# Patient Record
Sex: Female | Born: 1990 | Race: Black or African American | Hispanic: No | Marital: Single | Smoking: Never smoker
Health system: Southern US, Community
[De-identification: ages and names within clinical notes are randomized; demographics above are authoritative.]

## PROBLEM LIST (undated history)

## (undated) DIAGNOSIS — D649 Anemia, unspecified: Secondary | ICD-10-CM

## (undated) DIAGNOSIS — I10 Essential (primary) hypertension: Secondary | ICD-10-CM

## (undated) DIAGNOSIS — J45909 Unspecified asthma, uncomplicated: Secondary | ICD-10-CM

## (undated) DIAGNOSIS — Z9109 Other allergy status, other than to drugs and biological substances: Secondary | ICD-10-CM

## (undated) HISTORY — PX: NO PAST SURGERIES: SHX2092

## (undated) HISTORY — PX: WISDOM TOOTH EXTRACTION: SHX21

---
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
## 2011-01-10 LAB — RAPID STREP SCREEN (MED CTR MEBANE ONLY): Streptococcus, Group A Screen (Direct): NEGATIVE

## 2011-01-10 LAB — STREP A DNA PROBE: Group A Strep Probe: NEGATIVE

## 2011-03-08 ENCOUNTER — Emergency Department (HOSPITAL_COMMUNITY)
Admission: EM | Admit: 2011-03-08 | Discharge: 2011-03-08 | Disposition: A | Discharge status: Home / Self Care | Payer: Medicaid - Out of State | Attending: Emergency Medicine | Admitting: Emergency Medicine

## 2011-03-08 DIAGNOSIS — J029 Acute pharyngitis, unspecified: Secondary | ICD-10-CM

## 2011-03-08 MED ORDER — PENICILLIN G BENZATHINE 1200000 UNIT/2ML IM SUSP
1.2000 10*6.[IU] | Freq: Once | INTRAMUSCULAR | Status: AC
  Administered 2011-03-08: 1.2 10*6.[IU] via INTRAMUSCULAR
  Filled 2011-03-08: qty 2

## 2011-03-08 MED ORDER — HYDROCODONE-ACETAMINOPHEN 7.5-500 MG/15ML PO SOLN: 10.0000 mL | Freq: Four times a day (QID) | ORAL | Status: AC | PRN

## 2011-03-08 MED ORDER — HYDROCODONE-ACETAMINOPHEN 7.5-500 MG/15ML PO SOLN
10.0000 mL | Freq: Once | ORAL | Status: AC
  Administered 2011-03-08: 10 mL via ORAL
  Filled 2011-03-08: qty 15

## 2011-03-08 NOTE — ED Notes (Signed)
Sore throat and nasal congestion  3-4 days

## 2011-03-08 NOTE — ED Provider Notes (Signed)
History     CSN: [id number] Arrival date & time: 03/08/2011  2:10 AM   First MD Initiated Contact with Patient 03/08/11 0236      Chief Complaint  Patient presents with  . Sore Throat    (Consider location/radiation/quality/duration/timing/severity/associated sxs/prior treatment) Patient is a 20 y.o. female presenting with pharyngitis. The history is provided by the patient and a parent.  Sore Throat Pertinent negatives include no headaches.   the patient is a 20 year old portly, obese female, with no significant past medical history, who presents to the emergency department complaining of congestion and sore throat for 3 days.  She has had a fever, as well.  She denies a cough.  She has had nausea and vomiting.  No diarrhea.  Besides her throat.  She does not hurt anywhere else.  She does not smoke cigarettes.  The  History reviewed. No pertinent past medical history.  History reviewed. No pertinent past surgical history.  No family history on file.  History  Substance Use Topics  . Smoking status: Never Smoker   . Smokeless tobacco: Not on file  . Alcohol Use: No    OB History    Grav Para Term Preterm Abortions TAB SAB Ect Mult Living                  Review of Systems  Constitutional: Positive for fever and chills.  HENT: Positive for congestion, sore throat and trouble swallowing. Negative for voice change.   Respiratory: Negative for cough.   Gastrointestinal: Positive for nausea and vomiting.  Skin: Negative for rash.  Neurological: Negative for headaches.  Psychiatric/Behavioral: Negative for confusion.    Allergies  Review of patient's allergies indicates no known allergies.  Home Medications  No current outpatient prescriptions on file.  BP 131/87  Pulse 102  Temp(Src) 97.4 F (36.3 C) (Oral)  Resp 22  Ht 5\' 3"  (1.6 m)  Wt 318 lb 5 oz (144.386 kg)  BMI 56.39 kg/m2  SpO2 100%  LMP 02/27/2011  Physical Exam  Constitutional: She is oriented to  person, place, and time.       Morbidly obese  HENT:  Head: Normocephalic and atraumatic.  Mouth/Throat: Oropharyngeal exudate present.       Bilateral tonsillar edema with erythema and exudate  Eyes: Pupils are equal, round, and reactive to light.  Neck: Normal range of motion.  Pulmonary/Chest: No respiratory distress.  Musculoskeletal: Normal range of motion.  Lymphadenopathy:    She has cervical adenopathy.  Neurological: She is alert and oriented to person, place, and time. No cranial nerve deficit.  Skin: Skin is warm and dry.  Psychiatric: She has a normal mood and affect. Her behavior is normal.    ED Course  Procedures (including critical care time)       MDM  Exudative pharyngitis No evidence of peritonsillar abscess The patient is not toxic.  She speaks clearly and there is no evidence of airway compromise        Nicholes Stairs, MD 03/08/11 [phone]

## 2011-03-21 ENCOUNTER — Emergency Department (HOSPITAL_COMMUNITY)
Admission: EM | Admit: 2011-03-21 | Discharge: 2011-03-22 | Disposition: A | Discharge status: Home / Self Care | Payer: Medicaid - Out of State | Attending: Emergency Medicine | Admitting: Emergency Medicine

## 2011-03-21 ENCOUNTER — Encounter (HOSPITAL_COMMUNITY): Payer: Self-pay | Admitting: *Deleted

## 2011-03-21 DIAGNOSIS — J3489 Other specified disorders of nose and nasal sinuses: Secondary | ICD-10-CM | POA: Insufficient documentation

## 2011-03-21 DIAGNOSIS — R059 Cough, unspecified: Secondary | ICD-10-CM | POA: Insufficient documentation

## 2011-03-21 DIAGNOSIS — R51 Headache: Secondary | ICD-10-CM | POA: Insufficient documentation

## 2011-03-21 DIAGNOSIS — R42 Dizziness and giddiness: Secondary | ICD-10-CM | POA: Insufficient documentation

## 2011-03-21 DIAGNOSIS — J111 Influenza due to unidentified influenza virus with other respiratory manifestations: Secondary | ICD-10-CM | POA: Insufficient documentation

## 2011-03-21 DIAGNOSIS — R05 Cough: Secondary | ICD-10-CM | POA: Insufficient documentation

## 2011-03-21 DIAGNOSIS — R509 Fever, unspecified: Secondary | ICD-10-CM | POA: Insufficient documentation

## 2011-03-21 NOTE — ED Notes (Signed)
Reports dizziness, headache, congestion and fever began yesterday.

## 2011-03-22 ENCOUNTER — Emergency Department (HOSPITAL_COMMUNITY): Payer: Medicaid - Out of State

## 2011-03-22 MED ORDER — NAPROXEN 500 MG PO TABS: 500.0000 mg | ORAL_TABLET | Freq: Two times a day (BID) | ORAL | Status: DC

## 2011-03-22 MED ORDER — ONDANSETRON 4 MG PO TBDP
4.0000 mg | ORAL_TABLET | Freq: Once | ORAL | Status: AC
  Administered 2011-03-22: 4 mg via ORAL
  Filled 2011-03-22: qty 1

## 2011-03-22 MED ORDER — ACETAMINOPHEN 500 MG PO TABS
1000.0000 mg | ORAL_TABLET | Freq: Once | ORAL | Status: AC
  Administered 2011-03-22: 1000 mg via ORAL
  Filled 2011-03-22: qty 2

## 2011-03-22 NOTE — ED Notes (Signed)
Patient to radiology via wheelchair.

## 2011-03-22 NOTE — ED Notes (Signed)
MD at bedside. 

## 2011-03-22 NOTE — ED Notes (Signed)
Remains resting in bed with eyes closed and lights off. Equal chest rise and fall. No distress. Family with patient. Will continue to monitor.

## 2011-03-22 NOTE — ED Notes (Signed)
Into room to assess patient. Resting in bed on right side. No distress. Equal chest rise and fall. Clear lung sounds in all fields. States she has just had a dry, hacking cough, nonproductive. No shortness of breath. Family with patient. Call bell at bedside.

## 2011-03-22 NOTE — ED Provider Notes (Signed)
History     CSN: [id number] Arrival date & time: 03/21/2011 11:57 PM   First MD Initiated Contact with Patient 03/22/11 0009      Chief Complaint  Patient presents with  . Dizziness  . Nasal Congestion    (Consider location/radiation/quality/duration/timing/severity/associated sxs/prior treatment) The history is provided by the patient.   patient 20-year-old female negative past medical history onset of some dizziness headache congestion and fever that began yesterday. MAXIMUM TEMPERATURE is 102. Patient did not have the flu shot. No nausea no vomiting no diarrhea. Cough is nonproductive.   History reviewed. No pertinent past medical history.  History reviewed. No pertinent past surgical history.  No family history on file.  History  Substance Use Topics  . Smoking status: Never Smoker   . Smokeless tobacco: Not on file  . Alcohol Use: No    OB History    Grav Para Term Preterm Abortions TAB SAB Ect Mult Living                  Review of Systems  Constitutional: Positive for fever and chills.  HENT: Positive for congestion. Negative for neck pain.   Eyes: Negative for photophobia and visual disturbance.  Respiratory: Positive for cough. Negative for shortness of breath.   Cardiovascular: Negative for chest pain.  Gastrointestinal: Negative for nausea, vomiting, abdominal pain and diarrhea.  Genitourinary: Negative for dysuria and hematuria.  Musculoskeletal: Positive for myalgias. Negative for back pain.  Skin: Negative for rash.  Neurological: Positive for headaches.  Hematological: Negative for adenopathy.  Psychiatric/Behavioral: Negative for confusion.    Allergies  Review of patient's allergies indicates no known allergies.  Home Medications   Current Outpatient Rx  Name Route Sig Dispense Refill  . HYDROCODONE-ACETAMINOPHEN 5-325 MG PO TABS Oral Take 1 tablet by mouth every 6 (six) hours as needed.      Marland Kitchen NAPROXEN 500 MG PO TABS Oral Take 1 tablet  (500 mg total) by mouth 2 (two) times daily. 14 tablet 0    BP 94/74  Pulse 130  Temp(Src) 102 F (38.9 C) (Oral)  Resp 20  Ht 5\' 3"  (1.6 m)  Wt 320 lb (145.151 kg)  BMI 56.69 kg/m2  SpO2 98%  LMP 03/14/2011  Physical Exam  Nursing note and vitals reviewed. Constitutional: She is oriented to person, place, and time. She appears well-developed and well-nourished. No distress.  HENT:  Head: Normocephalic and atraumatic.  Mouth/Throat: Oropharynx is clear and moist.  Eyes: Conjunctivae and EOM are normal. Pupils are equal, round, and reactive to light.  Neck: Normal range of motion. Neck supple.  Cardiovascular: Normal rate, regular rhythm and normal heart sounds.   No murmur heard. Pulmonary/Chest: Effort normal. She has no wheezes.  Abdominal: Soft. Bowel sounds are normal. There is no tenderness.  Musculoskeletal: Normal range of motion.  Lymphadenopathy:    She has no cervical adenopathy.  Neurological: She is alert and oriented to person, place, and time. No cranial nerve deficit. She exhibits normal muscle tone. Coordination normal.  Skin: Skin is warm. No rash noted.    ED Course  Procedures (including critical care time)  Labs Reviewed - No data to display Dg Chest 2 View  03/22/2011  *RADIOLOGY REPORT*  Clinical Data: Cough, congestion, fever, nausea, and vomiting.  CHEST - 2 VIEW  Comparison: 07/31/2007  Findings: The heart size and pulmonary vascularity are normal. The lungs appear clear and expanded without focal air space disease or consolidation. No blunting of the costophrenic angles.  No pneumothorax.  No significant change since previous study.  IMPRESSION: No evidence of active pulmonary disease.  Original Report Authenticated By: Marlon Pel, M.D.     1. Influenza       MDM   Symptom complex consistent with influenza. Patient in no acute distress nontoxic in the emergency department. Chest x-ray negative for pneumonia. Fever treated with  Tylenol patient also taking hydrocodone at home she had left over for the cough and fever. Patient given flu precautions. Return for new or worse symptoms.        Shelda Jakes, MD 03/22/11 [phone]

## 2011-03-22 NOTE — ED Notes (Signed)
Patient back to room from radiology. Pain 7/10. No respiratory difficulties. No distress at this time. Call bell within reach. Family at bedside. Given warm blanket per request. Denies any other needs.

## 2011-07-22 ENCOUNTER — Emergency Department (HOSPITAL_COMMUNITY): Payer: Medicaid - Out of State

## 2011-07-22 ENCOUNTER — Encounter (HOSPITAL_COMMUNITY): Payer: Self-pay | Admitting: *Deleted

## 2011-07-22 ENCOUNTER — Emergency Department (HOSPITAL_COMMUNITY)
Admission: EM | Admit: 2011-07-22 | Discharge: 2011-07-22 | Disposition: A | Discharge status: Home / Self Care | Payer: Medicaid - Out of State | Attending: Emergency Medicine | Admitting: Emergency Medicine

## 2011-07-22 DIAGNOSIS — X500XXA Overexertion from strenuous movement or load, initial encounter: Secondary | ICD-10-CM | POA: Insufficient documentation

## 2011-07-22 DIAGNOSIS — S82891A Other fracture of right lower leg, initial encounter for closed fracture: Secondary | ICD-10-CM

## 2011-07-22 DIAGNOSIS — S82899A Other fracture of unspecified lower leg, initial encounter for closed fracture: Secondary | ICD-10-CM | POA: Insufficient documentation

## 2011-07-22 MED ORDER — IBUPROFEN 800 MG PO TABS
800.0000 mg | ORAL_TABLET | Freq: Once | ORAL | Status: AC
  Administered 2011-07-22: 800 mg via ORAL
  Filled 2011-07-22: qty 1

## 2011-07-22 MED ORDER — IBUPROFEN 800 MG PO TABS: 800.0000 mg | ORAL_TABLET | Freq: Three times a day (TID) | ORAL | Status: AC

## 2011-07-22 MED ORDER — HYDROCODONE-ACETAMINOPHEN 5-325 MG PO TABS: ORAL_TABLET | ORAL | Status: AC

## 2011-07-22 NOTE — Discharge Instructions (Signed)
Ankle Fracture A fracture is a break in the bone. A cast or splint is used to protect and keep your injured bone from moving.  HOME CARE INSTRUCTIONS   Use your crutches as directed.   To lessen the swelling, keep the injured leg elevated while sitting or lying down.   Apply ice to the injury for 15 to 20 minutes, 3 to 4 times per day while awake for 2 days. Put the ice in a plastic bag and place a thin towel between the bag of ice and your cast.   If you have a plaster or fiberglass cast:   Do not try to scratch the skin under the cast using sharp or pointed objects.   Check the skin around the cast every day. You may put lotion on any red or sore areas.   Keep your cast dry and clean.   If you have a plaster splint:   Wear the splint as directed.   You may loosen the elastic around the splint if your toes become numb, tingle, or turn cold or blue.   Do not put pressure on any part of your cast or splint; it may break. Rest your cast only on a pillow the first 24 hours until it is fully hardened.   Your cast or splint can be protected during bathing with a plastic bag. Do not lower the cast or splint into water.   Take medications as directed by your caregiver. Only take over-the-counter or prescription medicines for pain, discomfort, or fever as directed by your caregiver.   Do not drive a vehicle until your caregiver specifically tells you it is safe to do so.   If your caregiver has given you a follow-up appointment, it is very important to keep that appointment. Not keeping the appointment could result in a chronic or permanent injury, pain, and disability. If there is any problem keeping the appointment, you must call back to this facility for assistance.  SEEK IMMEDIATE MEDICAL CARE IF:   Your cast gets damaged or breaks.   You have continued severe pain or more swelling than you did before the cast was put on.   Your skin or toenails below the injury turn blue or gray,  or feel cold or numb.   There is a bad smell or new stains and/or purulent (pus like) drainage coming from under the cast.  If you do not have a window in your cast for observing the wound, a discharge or minor bleeding may show up as a stain on the outside of your cast. Report these findings to your caregiver. MAKE SURE YOU:   Understand these instructions.   Will watch your condition.   Will get help right away if you are not doing well or get worse.  Document Released: 03/24/2000 Document Revised: 03/16/2011 Document Reviewed: 10/29/2007 [employer] Patient Information 2012 [employer]. 

## 2011-07-22 NOTE — ED Notes (Signed)
Pt c/o pain in her right ankle with movement and pressure. Pt states that she twisted her ankle last night.

## 2011-07-22 NOTE — ED Provider Notes (Signed)
History     CSN: [id number]  Arrival date & time 07/22/11  Meagan Oneill   First MD Initiated Contact with Patient 07/22/11 1837      Chief Complaint  Patient presents with  . Ankle Injury    (Consider location/radiation/quality/duration/timing/severity/associated sxs/prior treatment) Patient is a 21 y.o. female presenting with lower extremity injury. The history is provided by the patient.  Ankle Injury This is a new problem. The current episode started yesterday. The problem occurs constantly. The problem has been unchanged. Associated symptoms include arthralgias and joint swelling. Pertinent negatives include no fever, neck pain, numbness or weakness. The symptoms are aggravated by twisting, walking and standing. Treatments tried: Elevation. The treatment provided no relief.    History reviewed. No pertinent past medical history.  History reviewed. No pertinent past surgical history.  History reviewed. No pertinent family history.  History  Substance Use Topics  . Smoking status: Never Smoker   . Smokeless tobacco: Not on file  . Alcohol Use: No    OB History    Grav Para Term Preterm Abortions TAB SAB Ect Mult Living                  Review of Systems  Constitutional: Negative for fever.  HENT: Negative for neck pain.   Musculoskeletal: Positive for joint swelling and arthralgias. Negative for back pain.  Skin: Negative.   Neurological: Negative for weakness and numbness.  All other systems reviewed and are negative.    Allergies  Review of patient's allergies indicates no known allergies.  Home Medications   Current Outpatient Rx  Name Route Sig Dispense Refill  . HYDROCODONE-ACETAMINOPHEN 5-325 MG PO TABS Oral Take 1 tablet by mouth every 6 (six) hours as needed.      Marland Kitchen NAPROXEN 500 MG PO TABS Oral Take 1 tablet (500 mg total) by mouth 2 (two) times daily. 14 tablet 0    BP 119/64  Pulse 99  Temp(Src) 97.9 F (36.6 C) (Oral)  Resp 22  Ht 5\' 3"  (1.6 m)   Wt 320 lb (145.151 kg)  BMI 56.69 kg/m2  SpO2 99%  LMP 06/21/2011  Physical Exam  Nursing note and vitals reviewed. Constitutional: She is oriented to person, place, and time. She appears well-developed and well-nourished. No distress.  HENT:  Head: Normocephalic and atraumatic.  Neck: Normal range of motion. Neck supple.  Cardiovascular: Normal rate, regular rhythm, normal heart sounds and intact distal pulses.   No murmur heard. Pulmonary/Chest: Effort normal and breath sounds normal. No respiratory distress.  Musculoskeletal: Normal range of motion. She exhibits edema and tenderness.       Right ankle: She exhibits swelling. She exhibits normal range of motion, no ecchymosis, no deformity, no laceration and normal pulse. tenderness. Lateral malleolus and head of 5th metatarsal tenderness found. No proximal fibula tenderness found. Achilles tendon normal.       Feet:       ttp of the lateral right ankle and foot.  Mild STS.    Neurological: She is alert and oriented to person, place, and time. She exhibits normal muscle tone. Coordination normal.  Skin: Skin is warm and dry.    ED Course  Procedures (including critical care time)  Dg Ankle Complete Right  07/22/2011  *RADIOLOGY REPORT*  Clinical Data: Right ankle pain following injury.  RIGHT ANKLE - COMPLETE 3+ VIEW  Comparison: None  Findings: A faint linear lucency within the distal fibula is noted and may represent a nondisplaced fracture. Mild soft  tissue swelling is present. There is no evidence of subluxation or dislocation. Ankle mortise intact. No radiopaque foreign bodies are identified.  IMPRESSION: Question subtle nondisplaced lateral malleolar fracture.  Original Report Authenticated By: Rosendo Gros, M.D.   Dg Foot Complete Right  07/22/2011  *RADIOLOGY REPORT*  Clinical Data: Right foot/ankle pain, swelling  RIGHT FOOT COMPLETE - 3+ VIEW  Comparison: None.  Findings: No fracture or dislocation is seen.  The joint  spaces are preserved.  Mild soft tissue swelling/edema.  IMPRESSION: No fracture or dislocation is seen.  Mild soft tissue swelling/edema.  Original Report Authenticated By: Charline Bills, M.D.    Cam Walker applied to right ankle, pain improved. Patient remains neurovascularly intact.  MDM    TREATMENT IN THE ED:  Po motrin, CAM walker     PRESCRIPTIONS GIVEN AT DISCHARGE: ibuprofen and norco    ttp of the lateral right foot and ankle.  Mild edema.  No bruising.  Pain is reproduced with movement.  X-rays today are neg for fx.  Pt given referral for Dr. Hilda Lias.    Consulted Dr. Hilda Lias, will place pt in Cam walker and he agrees to see her in his office on Monday for f/u   Patient / Family / Caregiver understand and agree with initial ED impression and plan with expectations set for ED visit. Pt stable in ED with no significant deterioration in condition. Pt feels improved after observation and/or treatment in ED.           Karlen Barbar L. [place], [place] 07/22/11 1945

## 2011-07-22 NOTE — ED Provider Notes (Signed)
Shelda Jakes, MD Medical screening examination/treatment/procedure(s) were performed by non-physician practitioner and as supervising physician I was immediately available for consultation/collaboration.  Shelda Jakes, MD 07/22/11 [phone]

## 2011-11-23 ENCOUNTER — Emergency Department (HOSPITAL_COMMUNITY)
Admission: EM | Admit: 2011-11-23 | Discharge: 2011-11-23 | Disposition: A | Discharge status: Home / Self Care | Payer: Medicaid - Out of State | Attending: Emergency Medicine | Admitting: Emergency Medicine

## 2011-11-23 ENCOUNTER — Encounter (HOSPITAL_COMMUNITY): Payer: Self-pay | Admitting: *Deleted

## 2011-11-23 DIAGNOSIS — IMO0002 Reserved for concepts with insufficient information to code with codable children: Secondary | ICD-10-CM | POA: Insufficient documentation

## 2011-11-23 DIAGNOSIS — L02413 Cutaneous abscess of right upper limb: Secondary | ICD-10-CM

## 2011-11-23 MED ORDER — DOXYCYCLINE HYCLATE 100 MG PO CAPS: 100.0000 mg | ORAL_CAPSULE | Freq: Two times a day (BID) | ORAL | Status: AC

## 2011-11-23 MED ORDER — DOXYCYCLINE HYCLATE 100 MG PO TABS
100.0000 mg | ORAL_TABLET | Freq: Once | ORAL | Status: AC
  Administered 2011-11-23: 100 mg via ORAL
  Filled 2011-11-23: qty 1

## 2011-11-23 MED ORDER — LIDOCAINE HCL (PF) 1 % IJ SOLN
INTRAMUSCULAR | Status: AC
  Administered 2011-11-23: 21:00:00
  Filled 2011-11-23: qty 5

## 2011-11-23 NOTE — ED Notes (Signed)
Abscess to rt forearm x 1 week.

## 2011-11-23 NOTE — ED Provider Notes (Signed)
Medical screening examination/treatment/procedure(s) were performed by non-physician practitioner and as supervising physician I was immediately available for consultation/collaboration.   Ward Givens, MD 11/23/11 2110

## 2011-11-23 NOTE — ED Notes (Signed)
Pt presents to ED with abscess to right forearm. Pt states she first noticed area 1 week ago. Pt states "some pus came out at first but now it just hurts". Pt describes pain as "burning".

## 2011-11-23 NOTE — ED Provider Notes (Signed)
History     CSN: [id number]  Arrival date & time 11/23/11  1737   First MD Initiated Contact with Patient 11/23/11 1922      Chief Complaint  Patient presents with  . Abscess    (Consider location/radiation/quality/duration/timing/severity/associated sxs/prior treatment) HPI Comments: States a small pustule came up on forearm and she squeezed it.  It has become red, swollen and painful since then.  No fever or chills.  Patient is a 21 y.o. female presenting with abscess.  Abscess  This is a new problem. Episode onset: several days ago. The problem has been gradually worsening. Affected Location: R forearm. The problem is moderate. The abscess is characterized by redness, swelling and painfulness. The abscess first occurred at home. Pertinent negatives include no fever. Her past medical history does not include atopy in family or skin abscesses in family. There were no sick contacts. She has received no recent medical care.    History reviewed. No pertinent past medical history.  History reviewed. No pertinent past surgical history.  History reviewed. No pertinent family history.  History  Substance Use Topics  . Smoking status: Never Smoker   . Smokeless tobacco: Not on file  . Alcohol Use: Yes     occ    OB History    Grav Para Term Preterm Abortions TAB SAB Ect Mult Living                  Review of Systems  Constitutional: Negative for fever and chills.  Skin:       Abscess   All other systems reviewed and are negative.    Allergies  Review of patient's allergies indicates no known allergies.  Home Medications   Current Outpatient Rx  Name Route Sig Dispense Refill  . ETONOGESTREL 68 MG [place] IMPL Subcutaneous Inject 1 each into the skin once.    Marland Kitchen DOXYCYCLINE HYCLATE 100 MG PO CAPS Oral Take 1 capsule (100 mg total) by mouth 2 (two) times daily. 20 capsule 0    BP 129/77  Pulse 76  Temp 97.9 F (36.6 C) (Oral)  Resp 18  Ht 5\' 3"  (1.6 m)  Wt 321 lb  (145.605 kg)  BMI 56.86 kg/m2  SpO2 100%  LMP 10/09/2011  Physical Exam  Nursing note and vitals reviewed. Constitutional: She is oriented to person, place, and time. She appears well-developed and well-nourished. No distress.  HENT:  Head: Normocephalic and atraumatic.  Eyes: EOM are normal.  Neck: Normal range of motion.  Cardiovascular: Normal rate, regular rhythm and normal heart sounds.   Pulmonary/Chest: Effort normal and breath sounds normal.  Abdominal: Soft. She exhibits no distension. There is no tenderness.  Musculoskeletal: Normal range of motion.       Arms: Neurological: She is alert and oriented to person, place, and time.  Skin: Skin is warm and dry.  Psychiatric: She has a normal mood and affect. Judgment normal.    ED Course  INCISION AND DRAINAGE Date/Time: 11/23/2011 8:20 PM Performed by: Evalina Field Authorized by: Evalina Field Consent: Verbal consent obtained. Written consent not obtained. Risks and benefits: risks, benefits and alternatives were discussed Consent given by: patient Patient understanding: patient states understanding of the procedure being performed Patient consent: the patient's understanding of the procedure matches consent given Site marked: the operative site was not marked Imaging studies: imaging studies not available Patient identity confirmed: verbally with patient Time out: Immediately prior to procedure a "time out" was called to verify the correct patient,  procedure, equipment, support staff and site/side marked as required. Type: abscess Local anesthetic: lidocaine 1% with epinephrine Anesthetic total: 3 ml Scalpel size: 11 Needle gauge: 25 ga. Incision type: single straight Complexity: simple Drainage: purulent Drainage amount: moderate Packing material: 1/4 in iodoform gauze (6 inches) Patient tolerance: Patient tolerated the procedure well with no immediate complications.   (including critical care time)  Labs  Reviewed - No data to display No results found.   1. Abscess of right forearm       MDM  rx-doxycycline, 20 Ibuprofen Warm compresses. Remove 1 inch of packin QD til gone        [place], [place] 11/23/11 2056

## 2012-07-21 ENCOUNTER — Emergency Department (HOSPITAL_COMMUNITY)
Admission: EM | Admit: 2012-07-21 | Discharge: 2012-07-21 | Disposition: A | Discharge status: Home / Self Care | Payer: Self-pay | Attending: Emergency Medicine | Admitting: Emergency Medicine

## 2012-07-21 ENCOUNTER — Encounter (HOSPITAL_COMMUNITY): Payer: Self-pay | Admitting: *Deleted

## 2012-07-21 ENCOUNTER — Emergency Department (HOSPITAL_COMMUNITY): Payer: Self-pay

## 2012-07-21 DIAGNOSIS — W108XXA Fall (on) (from) other stairs and steps, initial encounter: Secondary | ICD-10-CM | POA: Insufficient documentation

## 2012-07-21 DIAGNOSIS — X503XXA Overexertion from repetitive movements, initial encounter: Secondary | ICD-10-CM | POA: Insufficient documentation

## 2012-07-21 DIAGNOSIS — Y9389 Activity, other specified: Secondary | ICD-10-CM | POA: Insufficient documentation

## 2012-07-21 DIAGNOSIS — Y929 Unspecified place or not applicable: Secondary | ICD-10-CM | POA: Insufficient documentation

## 2012-07-21 DIAGNOSIS — S93409A Sprain of unspecified ligament of unspecified ankle, initial encounter: Secondary | ICD-10-CM | POA: Insufficient documentation

## 2012-07-21 DIAGNOSIS — S93401A Sprain of unspecified ligament of right ankle, initial encounter: Secondary | ICD-10-CM

## 2012-07-21 MED ORDER — HYDROCODONE-ACETAMINOPHEN 5-325 MG PO TABS: 1.0000 | ORAL_TABLET | ORAL | Status: DC | PRN

## 2012-07-21 MED ORDER — MELOXICAM 7.5 MG PO TABS: ORAL_TABLET | ORAL | Status: DC

## 2012-07-21 MED ORDER — KETOROLAC TROMETHAMINE 10 MG PO TABS
10.0000 mg | ORAL_TABLET | Freq: Once | ORAL | Status: AC
  Administered 2012-07-21: 10 mg via ORAL
  Filled 2012-07-21: qty 1

## 2012-07-21 NOTE — ED Notes (Signed)
States that she twisted right ankle yesterday and is not able to put weight on it, hx of ankle fx to same ankle per pt.

## 2012-07-21 NOTE — ED Notes (Signed)
Twisted ankle yesterday at 1400

## 2012-07-25 NOTE — ED Provider Notes (Signed)
Medical screening examination/treatment/procedure(s) were performed by non-physician practitioner and as supervising physician I was immediately available for consultation/collaboration.   Glynn Octave, MD 07/25/12 [phone]

## 2012-07-25 NOTE — ED Provider Notes (Signed)
History     CSN: [id number]  Arrival date & time 07/21/12  1537   First MD Initiated Contact with Patient 07/21/12 1606      Chief Complaint  Patient presents with  . Ankle Pain    (Consider location/radiation/quality/duration/timing/severity/associated sxs/prior treatment) Patient is a 22 y.o. female presenting with ankle pain. The history is provided by the patient.  Ankle Pain Location:  Ankle Time since incident:  1 day Injury: yes   Mechanism of injury comment:  Twisted anke due to a mis-step Ankle location:  R ankle Pain details:    Quality:  Aching   Radiates to:  Does not radiate   Severity:  Moderate   Onset quality:  Sudden   Duration:  1 day   Timing:  Constant   Progression:  Worsening Chronicity:  New Dislocation: no   Foreign body present:  No foreign bodies Prior injury to area:  No Relieved by:  Nothing Worsened by:  Nothing tried Ineffective treatments:  None tried Associated symptoms: stiffness and swelling   Associated symptoms: no back pain and no neck pain     History reviewed. No pertinent past medical history.  History reviewed. No pertinent past surgical history.  No family history on file.  History  Substance Use Topics  . Smoking status: Never Smoker   . Smokeless tobacco: Not on file  . Alcohol Use: Yes     Comment: occ    OB History   Grav Para Term Preterm Abortions TAB SAB Ect Mult Living                  Review of Systems  Constitutional: Negative for activity change.       All ROS Neg except as noted in HPI  HENT: Negative for nosebleeds and neck pain.   Eyes: Negative for photophobia and discharge.  Respiratory: Negative for cough, shortness of breath and wheezing.   Cardiovascular: Negative for chest pain and palpitations.  Gastrointestinal: Negative for abdominal pain and blood in stool.  Genitourinary: Negative for dysuria, frequency and hematuria.  Musculoskeletal: Positive for stiffness. Negative for back pain  and arthralgias.  Skin: Negative.   Neurological: Negative for dizziness, seizures and speech difficulty.  Psychiatric/Behavioral: Negative for hallucinations and confusion.    Allergies  Review of patient's allergies indicates no known allergies.  Home Medications   Current Outpatient Rx  Name  Route  Sig  Dispense  Refill  . etonogestrel (IMPLANON) 68 MG IMPL implant   Subcutaneous   Inject 1 each into the skin once.         Marland Kitchen HYDROcodone-acetaminophen (NORCO/VICODIN) 5-325 MG per tablet   Oral   Take 1 tablet by mouth every 4 (four) hours as needed for pain.   15 tablet   0   . meloxicam (MOBIC) 7.5 MG tablet      1 po bid with food   14 tablet   0     BP 122/82  Pulse 96  Temp(Src) 97.9 F (36.6 C) (Oral)  Resp 24  Ht 5\' 4"  (1.626 m)  Wt 323 lb (146.512 kg)  BMI 55.42 kg/m2  SpO2 99%  LMP 04/22/2012  Physical Exam  Nursing note and vitals reviewed. Constitutional: She is oriented to person, place, and time. She appears well-developed and well-nourished.  Non-toxic appearance.  HENT:  Head: Normocephalic.  Right Ear: Tympanic membrane and external ear normal.  Left Ear: Tympanic membrane and external ear normal.  Eyes: EOM and lids are normal.  Pupils are equal, round, and reactive to light.  Neck: Normal range of motion. Neck supple. Carotid bruit is not present.  Cardiovascular: Normal rate, regular rhythm, normal heart sounds, intact distal pulses and normal pulses.   Pulmonary/Chest: Breath sounds normal. No respiratory distress.  Abdominal: Soft. Bowel sounds are normal. There is no tenderness. There is no guarding.  Musculoskeletal: Normal range of motion. She exhibits tenderness.  Moderate swelling of the lateral malleolus. No dislocation. FROM of the toe. Achilles intact.  Lymphadenopathy:       Head (right side): No submandibular adenopathy present.       Head (left side): No submandibular adenopathy present.    She has no cervical adenopathy.   Neurological: She is alert and oriented to person, place, and time. She has normal strength. No cranial nerve deficit or sensory deficit.  Skin: Skin is warm and dry.  Psychiatric: She has a normal mood and affect. Her speech is normal.    ED Course  Procedures (including critical care time)  Labs Reviewed - No data to display No results found.   1. Ankle sprain, right, initial encounter       MDM  Pt examined. Vital signs reviewed. Nursing notes reviewed. Xrays reviewed. Pt twisted the right ankle due to a mis-step. Xray of the ankle is negative except for soft tissue swelling. Pt fitted with ankle splint and crutches. Pt treated with toradol. In ED, andRx for mobic and norco given.       Kathie Dike, PA-C 07/25/12 1651

## 2013-07-13 ENCOUNTER — Encounter (HOSPITAL_COMMUNITY): Payer: Self-pay | Admitting: Emergency Medicine

## 2013-07-13 ENCOUNTER — Emergency Department (HOSPITAL_COMMUNITY)
Admission: EM | Admit: 2013-07-13 | Discharge: 2013-07-13 | Disposition: A | Discharge status: Home / Self Care | Payer: Medicaid - Out of State | Attending: Emergency Medicine | Admitting: Emergency Medicine

## 2013-07-13 DIAGNOSIS — Z79899 Other long term (current) drug therapy: Secondary | ICD-10-CM | POA: Insufficient documentation

## 2013-07-13 DIAGNOSIS — J45909 Unspecified asthma, uncomplicated: Secondary | ICD-10-CM | POA: Insufficient documentation

## 2013-07-13 DIAGNOSIS — J039 Acute tonsillitis, unspecified: Secondary | ICD-10-CM | POA: Insufficient documentation

## 2013-07-13 DIAGNOSIS — J069 Acute upper respiratory infection, unspecified: Secondary | ICD-10-CM

## 2013-07-13 HISTORY — DX: Other allergy status, other than to drugs and biological substances: Z91.09

## 2013-07-13 HISTORY — DX: Unspecified asthma, uncomplicated: J45.909

## 2013-07-13 MED ORDER — MAGIC MOUTHWASH W/LIDOCAINE: 5.0000 mL | Freq: Three times a day (TID) | ORAL | Status: DC | PRN

## 2013-07-13 MED ORDER — PSEUDOEPHEDRINE HCL 60 MG PO TABS: 60.0000 mg | ORAL_TABLET | ORAL | Status: DC | PRN

## 2013-07-13 MED ORDER — AMOXICILLIN 500 MG PO CAPS: 500.0000 mg | ORAL_CAPSULE | Freq: Three times a day (TID) | ORAL | Status: DC

## 2013-07-13 NOTE — Discharge Instructions (Signed)
Tonsillitis Tonsillitis is an infection of the throat. This infection causes the tonsils to become red, tender, and puffy (swollen). Tonsils are groups of tissue at the back of your throat. If bacteria caused your infection, antibiotic medicine will be given to you. Sometimes symptoms of tonsillitis can be relieved with the use of steroid medicine. If your tonsillitis is severe and happens often, you may need to get your tonsils removed (tonsillectomy). HOME CARE   Rest and sleep often.  Drink enough fluids to keep your pee (urine) clear or pale yellow.  While your throat is sore, eat soft or liquid foods like:  Soup.  Ice cream.  Instant breakfast drinks.  Eat frozen ice pops.  Gargle with a warm or cold liquid to help soothe the throat. Gargle with a water and salt mix. Mix 1/4 teaspoon of salt and 1/4 teaspoon of baking soda in 1 cup of water.  Only take medicines as told by your doctor.  If you are given medicines (antibiotics), take them as told. Finish them even if you start to feel better. GET HELP RIGHT AWAY IF:   You throw up (vomit).  You have a very bad headache.  You have a stiff neck.  You have chest pain.  You have trouble breathing or swallowing.  You have bad throat pain, drooling, or your voice changes.  You have bad pain not helped by medicine.  You cannot fully open your mouth.  You have redness, puffiness, or bad pain in the neck.  You have a fever.  You have a rash.  You cough up green, yellow-brown, or bloody fluid.  You cannot swallow liquids or food for 24 hours.  You notice that only one of your tonsils is swollen. MAKE SURE YOU:   Understand these instructions.  Will watch your condition.  Will get help right away if you are not doing well or get worse. Document Released: 09/13/2007 Document Revised: 11/27/2012 Document Reviewed: 09/13/2012 [facility] Patient Information 2014 [place], [place].  Upper Respiratory Infection, Adult An  upper respiratory infection (URI) is also known as the common cold. It is often caused by a type of germ (virus). Colds are easily spread (contagious). You can pass it to others by kissing, coughing, sneezing, or drinking out of the same glass. Usually, you get better in 1 or 2 weeks.  HOME CARE   Only take medicine as told by your doctor.  Use a warm mist humidifier or breathe in steam from a hot shower.  Drink enough water and fluids to keep your pee (urine) clear or pale yellow.  Get plenty of rest.  Return to work when your temperature is back to normal or as told by your doctor. You may use a face mask and wash your hands to stop your cold from spreading. GET HELP RIGHT AWAY IF:   After the first few days, you feel you are getting worse.  You have questions about your medicine.  You have chills, shortness of breath, or brown or red spit (mucus).  You have yellow or brown snot (nasal discharge) or pain in the face, especially when you bend forward.  You have a fever, puffy (swollen) neck, pain when you swallow, or white spots in the back of your throat.  You have a bad headache, ear pain, sinus pain, or chest pain.  You have a high-pitched whistling sound when you breathe in and out (wheezing).  You have a lasting cough or cough up blood.  You have sore  muscles or a stiff neck. MAKE SURE YOU:   Understand these instructions.  Will watch your condition.  Will get help right away if you are not doing well or get worse. Document Released: 09/13/2007 Document Revised: 06/19/2011 Document Reviewed: 08/01/2010 [facility] Patient Information 2014 [place].

## 2013-07-13 NOTE — ED Provider Notes (Signed)
CSN: [id number]     Arrival date & time 07/13/13  1654 History  This chart was scribed for non-physician practitioner, Pauline Ausammy Demaurion Dicioccio, PA-C,working with Joya Gaskinsonald W Wickline, MD, by Karle PlumberJennifer Tensley, ED Scribe.  This patient was seen in room APFT24/APFT24 and the patient's care was started at 5:08 PM.  Chief Complaint  Patient presents with  . Sore Throat  . Cough   The history is provided by the patient. No language interpreter was used.   HPI Comments:  Meagan Oneill is a 23 y.o. obese female with h/o asthma, who presents to the Emergency Department complaining of sore throat and cough that started two days ago. Pt reports associated head congestion. She states she had chills last night and she is experiencing pain when she swallows. Cough is intermittent and productive at times.  Subjective fever at home on the evening prior to arrival.  Pt states she took Tylenol Nighttime two nights ago with mild relief. She denies any known sick contacts. She denies chest or shortness of breath, abdominal pain, nausea, or vomiting. She denies any allergies to any medication.   Past Medical History  Diagnosis Date  . Asthma   . Environmental allergies    History reviewed. No pertinent past surgical history. No family history on file. History  Substance Use Topics  . Smoking status: Never Smoker   . Smokeless tobacco: Not on file  . Alcohol Use: Yes     Comment: occ   OB History   Grav Para Term Preterm Abortions TAB SAB Ect Mult Living                 Review of Systems  Constitutional: Positive for chills. Negative for fever and fatigue.  HENT: Positive for congestion and sore throat. Negative for trouble swallowing.   Respiratory: Negative for shortness of breath and wheezing.   Cardiovascular: Negative for chest pain and palpitations.  Gastrointestinal: Negative for nausea, vomiting, abdominal pain and blood in stool.  Genitourinary: Negative for dysuria, hematuria and flank pain.   Musculoskeletal: Negative for arthralgias, back pain, myalgias, neck pain and neck stiffness.  Skin: Negative for rash.  Neurological: Negative for dizziness, weakness and numbness.  Hematological: Does not bruise/bleed easily.    Allergies  Review of patient's allergies indicates no known allergies.  Home Medications   Current Outpatient Rx  Name  Route  Sig  Dispense  Refill  . etonogestrel (IMPLANON) 68 MG IMPL implant   Subcutaneous   Inject 1 each into the skin once.         Marland Kitchen. HYDROcodone-acetaminophen (NORCO/VICODIN) 5-325 MG per tablet   Oral   Take 1 tablet by mouth every 4 (four) hours as needed for pain.   15 tablet   0   . meloxicam (MOBIC) 7.5 MG tablet      1 po bid with food   14 tablet   0    Triage Vitals: BP 127/95  Pulse 106  Temp(Src) 97.4 F (36.3 C) (Oral)  Resp 24  Ht 5\' 3"  (1.6 m)  Wt 332 lb (150.594 kg)  BMI 58.83 kg/m2  SpO2 96% Physical Exam  Nursing note and vitals reviewed. Constitutional: She is oriented to person, place, and time. She appears well-developed and well-nourished. No distress.  HENT:  Head: Normocephalic and atraumatic.  Right Ear: Tympanic membrane and ear canal normal.  Left Ear: Tympanic membrane and ear canal normal.  Nose: Rhinorrhea present.  Mouth/Throat: Uvula is midline and mucous membranes are normal. No trismus  in the jaw. No uvula swelling. Posterior oropharyngeal edema and posterior oropharyngeal erythema present. No oropharyngeal exudate or tonsillar abscesses.  Eyes: Conjunctivae are normal.  Neck: Normal range of motion and phonation normal. Neck supple. No Brudzinski's sign and no Kernig's sign noted.  Cardiovascular: Normal rate, regular rhythm, normal heart sounds and intact distal pulses.   No murmur heard. Pulmonary/Chest: Effort normal and breath sounds normal. No respiratory distress. She has no wheezes. She has no rales.  Musculoskeletal: She exhibits no edema.  Lymphadenopathy:    She has  cervical adenopathy (superficial).  Neurological: She is alert and oriented to person, place, and time. She exhibits normal muscle tone. Coordination normal.  Skin: Skin is warm and dry.    ED Course  Procedures (including critical care time) DIAGNOSTIC STUDIES: Oxygen Saturation is 96% on RA, adequate by my interpretation.   COORDINATION OF CARE: 5:11 PM- Will prescribe antibiotics, decongestant, and a mouthwash to help with sore throat. Pt verbalizes understanding and agrees to plan.  Medications - No data to display  Labs Review Labs Reviewed - No data to display Imaging Review No results found.   EKG Interpretation None      MDM   Final diagnoses:  Tonsillitis  URI (upper respiratory infection)     Pt eating [employer] upon initiation of exam and swallowing without difficulty.  Sx's likely related to tonsillitis. No concerning sx's for infection of deep structures of the neck, PTA or meningitis.    I personally performed the services described in this documentation, which was scribed in my presence. The recorded information has been reviewed and is accurate.    Leslieanne Cobarrubias L. Trisha Mangle, PA-C 07/15/13 1953

## 2013-07-13 NOTE — ED Notes (Addendum)
Pt c/o cough and sore throat since Friday.  Says hurts to swallow.  Reports fever last night.  Pt congested.  Reports can't breathe out of nose.  Pt reports has been eating and drinking well.  Pt eating meal in waiting room.

## 2013-07-16 NOTE — ED Provider Notes (Signed)
Medical screening examination/treatment/procedure(s) were performed by non-physician practitioner and as supervising physician I was immediately available for consultation/collaboration.   EKG Interpretation None        Joya Gaskinsonald W Tashae Inda, MD 07/16/13 1818

## 2013-08-09 ENCOUNTER — Emergency Department (INDEPENDENT_AMBULATORY_CARE_PROVIDER_SITE_OTHER)
Admission: EM | Admit: 2013-08-09 | Discharge: 2013-08-09 | Disposition: A | Discharge status: Home / Self Care | Payer: Self-pay | Source: Home / Self Care | Attending: Family Medicine | Admitting: Family Medicine

## 2013-08-09 ENCOUNTER — Encounter (HOSPITAL_COMMUNITY): Payer: Self-pay | Admitting: Emergency Medicine

## 2013-08-09 DIAGNOSIS — N390 Urinary tract infection, site not specified: Secondary | ICD-10-CM

## 2013-08-09 LAB — POCT URINALYSIS DIP (DEVICE)
Bilirubin Urine: NEGATIVE
Glucose, UA: NEGATIVE mg/dL
Ketones, ur: NEGATIVE mg/dL
NITRITE: NEGATIVE
PROTEIN: NEGATIVE mg/dL
Specific Gravity, Urine: 1.01 (ref 1.005–1.030)
UROBILINOGEN UA: 0.2 mg/dL (ref 0.0–1.0)
pH: 5.5 (ref 5.0–8.0)

## 2013-08-09 LAB — POCT PREGNANCY, URINE: Preg Test, Ur: NEGATIVE

## 2013-08-09 MED ORDER — CEPHALEXIN 500 MG PO CAPS: 500.0000 mg | ORAL_CAPSULE | Freq: Four times a day (QID) | ORAL | Status: DC

## 2013-08-09 NOTE — ED Provider Notes (Signed)
CSN: [id number]     Arrival date & time 08/09/13  1752 History   First MD Initiated Contact with Patient 08/09/13 1811     Chief Complaint  Patient presents with  . Urinary Tract Infection   (Consider location/radiation/quality/duration/timing/severity/associated sxs/prior Treatment) Patient is a 23 y.o. female presenting with frequency. The history is provided by the patient.  Urinary Frequency This is a new problem. The current episode started more than 2 days ago. The problem has been gradually worsening. Pertinent negatives include no abdominal pain.    Past Medical History  Diagnosis Date  . Asthma   . Environmental allergies    History reviewed. No pertinent past surgical history. No family history on file. History  Substance Use Topics  . Smoking status: Never Smoker   . Smokeless tobacco: Not on file  . Alcohol Use: Yes     Comment: occ   OB History   Grav Para Term Preterm Abortions TAB SAB Ect Mult Living                 Review of Systems  Constitutional: Negative.   Gastrointestinal: Negative.  Negative for abdominal pain.  Genitourinary: Positive for urgency and frequency. Negative for vaginal bleeding and vaginal discharge.    Allergies  Review of patient's allergies indicates no known allergies.  Home Medications   Prior to Admission medications   Medication Sig Start Date End Date Taking? Authorizing Provider  Alum & Mag Hydroxide-Simeth (MAGIC MOUTHWASH W/LIDOCAINE) SOLN Take 5 mLs by mouth 3 (three) times daily as needed for mouth pain. Swish and spit, do not swallow 07/13/13   Tammy L. Triplett, PA-C  amoxicillin (AMOXIL) 500 MG capsule Take 1 capsule (500 mg total) by mouth 3 (three) times daily. For 10 days 07/13/13   Tammy L. Triplett, PA-C  etonogestrel (IMPLANON) 68 MG IMPL implant Inject 1 each into the skin once.    Historical Provider, MD  HYDROcodone-acetaminophen (NORCO/VICODIN) 5-325 MG per tablet Take 1 tablet by mouth every 4 (four) hours as  needed for pain. 07/21/12   Kathie DikeHobson M Bryant, PA-C  meloxicam (MOBIC) 7.5 MG tablet 1 po bid with food 07/21/12   Kathie DikeHobson M Bryant, PA-C  pseudoephedrine (SUDAFED) 60 MG tablet Take 1 tablet (60 mg total) by mouth every 4 (four) hours as needed for congestion. 07/13/13   Tammy L. Triplett, PA-C   BP 122/89  Pulse 106  Temp(Src) 98.2 F (36.8 C) (Oral)  Resp 16  SpO2 100% Physical Exam  Nursing note and vitals reviewed. Constitutional: She is oriented to person, place, and time. She appears well-developed and well-nourished.  Abdominal: Soft. Bowel sounds are normal. She exhibits no distension and no mass. There is no hepatosplenomegaly. There is tenderness in the suprapubic area. There is no rebound, no guarding and no CVA tenderness.  Neurological: She is alert and oriented to person, place, and time.  Skin: Skin is warm and dry.    ED Course  Procedures (including critical care time) Labs Review Labs Reviewed  POCT URINALYSIS DIP (DEVICE) - Abnormal; Notable for the following:    Hgb urine dipstick MODERATE (*)    Leukocytes, UA SMALL (*)    All other components within normal limits  POCT PREGNANCY, URINE   U/a abnl. Imaging Review No results found.   MDM   1. UTI (lower urinary tract infection)        Linna HoffJames D Paiton Boultinghouse, MD 08/09/13 1840

## 2013-08-09 NOTE — Discharge Instructions (Signed)
Take all of medicine as directed, drink lots of fluids, see your doctor if further problems. °

## 2013-08-09 NOTE — ED Notes (Signed)
Pt c/o uti sx including burning after urination, frequency, and odor x 4 days. Pt denies fever or discharge. She is alert and oriented and in no acute distress.

## 2013-12-16 ENCOUNTER — Encounter (HOSPITAL_BASED_OUTPATIENT_CLINIC_OR_DEPARTMENT_OTHER): Payer: Self-pay | Admitting: Emergency Medicine

## 2013-12-16 ENCOUNTER — Emergency Department (HOSPITAL_BASED_OUTPATIENT_CLINIC_OR_DEPARTMENT_OTHER)
Admission: EM | Admit: 2013-12-16 | Discharge: 2013-12-16 | Disposition: A | Discharge status: Home / Self Care | Payer: Self-pay | Attending: Emergency Medicine | Admitting: Emergency Medicine

## 2013-12-16 DIAGNOSIS — N390 Urinary tract infection, site not specified: Secondary | ICD-10-CM

## 2013-12-16 DIAGNOSIS — Z792 Long term (current) use of antibiotics: Secondary | ICD-10-CM | POA: Insufficient documentation

## 2013-12-16 DIAGNOSIS — Z3202 Encounter for pregnancy test, result negative: Secondary | ICD-10-CM | POA: Insufficient documentation

## 2013-12-16 DIAGNOSIS — R350 Frequency of micturition: Secondary | ICD-10-CM | POA: Insufficient documentation

## 2013-12-16 DIAGNOSIS — Z791 Long term (current) use of non-steroidal anti-inflammatories (NSAID): Secondary | ICD-10-CM | POA: Insufficient documentation

## 2013-12-16 DIAGNOSIS — J45909 Unspecified asthma, uncomplicated: Secondary | ICD-10-CM | POA: Insufficient documentation

## 2013-12-16 LAB — URINE MICROSCOPIC-ADD ON

## 2013-12-16 LAB — URINALYSIS, ROUTINE W REFLEX MICROSCOPIC
Bilirubin Urine: NEGATIVE
GLUCOSE, UA: NEGATIVE mg/dL
KETONES UR: NEGATIVE mg/dL
Nitrite: NEGATIVE
Protein, ur: 100 mg/dL — AB
Specific Gravity, Urine: 1.024 (ref 1.005–1.030)
Urobilinogen, UA: 0.2 mg/dL (ref 0.0–1.0)
pH: 6 (ref 5.0–8.0)

## 2013-12-16 LAB — PREGNANCY, URINE: PREG TEST UR: NEGATIVE

## 2013-12-16 MED ORDER — CEPHALEXIN 500 MG PO CAPS: 500.0000 mg | ORAL_CAPSULE | Freq: Four times a day (QID) | ORAL | Status: DC

## 2013-12-16 NOTE — ED Provider Notes (Signed)
CSN: [id number]     Arrival date & time 12/16/13  1223 History   First MD Initiated Contact with Patient 12/16/13 1239     Chief Complaint  Patient presents with  . Urinary Frequency     (Consider location/radiation/quality/duration/timing/severity/associated sxs/prior Treatment) HPI Comments: Patient is a 23 year old morbidly obese female with a past medical history of asthma who presents to emergency department complaining of lower abdominal cramping, increased urinary frequency, urgency and dysuria. States this feels similar to her urinary tract infection she's had in the past. Denies fever, chills, nausea, vomiting or back pain. Denies vaginal discharge. Admits to being sexually active with multiple partners but uses protection. States she is not concerned about sexually transmitted diseases at this time. Last menstrual period was 2 months ago, however has a birth control implant and her menses are irregular.  Patient is a 22 y.o. female presenting with frequency. The history is provided by the patient.  Urinary Frequency Associated symptoms include abdominal pain (cramping).    Past Medical History  Diagnosis Date  . Asthma   . Environmental allergies    History reviewed. No pertinent past surgical history. No family history on file. History  Substance Use Topics  . Smoking status: Never Smoker   . Smokeless tobacco: Not on file  . Alcohol Use: Yes     Comment: occ   OB History   Grav Para Term Preterm Abortions TAB SAB Ect Mult Living                 Review of Systems  Gastrointestinal: Positive for abdominal pain (cramping).  Genitourinary: Positive for dysuria, urgency and frequency.  All other systems reviewed and are negative.     Allergies  Review of patient's allergies indicates no known allergies.  Home Medications   Prior to Admission medications   Medication Sig Start Date End Date Taking? Authorizing Provider  Alum & Mag Hydroxide-Simeth (MAGIC  MOUTHWASH W/LIDOCAINE) SOLN Take 5 mLs by mouth 3 (three) times daily as needed for mouth pain. Swish and spit, do not swallow 07/13/13   Tammy L. Triplett, PA-C  amoxicillin (AMOXIL) 500 MG capsule Take 1 capsule (500 mg total) by mouth 3 (three) times daily. For 10 days 07/13/13   Tammy L. Triplett, PA-C  cephALEXin (KEFLEX) 500 MG capsule Take 1 capsule (500 mg total) by mouth 4 (four) times daily. Take all of medicine and drink lots of fluids 08/09/13   Linna Hoff, MD  cephALEXin (KEFLEX) 500 MG capsule Take 1 capsule (500 mg total) by mouth 4 (four) times daily. 12/16/13   Trevor Mace, PA-C  etonogestrel (IMPLANON) 68 MG IMPL implant Inject 1 each into the skin once.    Historical Provider, MD  HYDROcodone-acetaminophen (NORCO/VICODIN) 5-325 MG per tablet Take 1 tablet by mouth every 4 (four) hours as needed for pain. 07/21/12   Kathie Dike, PA-C  meloxicam (MOBIC) 7.5 MG tablet 1 po bid with food 07/21/12   Kathie Dike, PA-C  pseudoephedrine (SUDAFED) 60 MG tablet Take 1 tablet (60 mg total) by mouth every 4 (four) hours as needed for congestion. 07/13/13   Tammy L. Triplett, PA-C   BP 146/91  Pulse 101  Temp(Src) 98.3 F (36.8 C) (Oral)  Resp 24  Ht  (1.626 m)  Wt 332 lb (150.594 kg)  BMI 56.96 kg/m2  SpO2 98% Physical Exam  Nursing note and vitals reviewed. Constitutional: She is oriented to person, place, and time. She appears well-developed and well-nourished.  No distress.  Morbidly obese.  HENT:  Head: Normocephalic and atraumatic.  Mouth/Throat: Oropharynx is clear and moist.  Eyes: Conjunctivae are normal.  Neck: Normal range of motion. Neck supple.  Cardiovascular: Normal rate, regular rhythm and normal heart sounds.   Pulmonary/Chest: Effort normal and breath sounds normal.  Abdominal: Soft. Bowel sounds are normal. There is tenderness.  Mild suprapubic tenderness. No peritoneal signs. No CVAT.  Genitourinary:  Deferred per pt.  Musculoskeletal: Normal range  of motion. She exhibits no edema.  Neurological: She is alert and oriented to person, place, and time.  Skin: Skin is warm and dry. She is not diaphoretic.  Psychiatric: She has a normal mood and affect. Her behavior is normal.    ED Course  Procedures (including critical care time) Labs Review Labs Reviewed  URINALYSIS, ROUTINE W REFLEX MICROSCOPIC - Abnormal; Notable for the following:    APPearance CLOUDY (*)    Hgb urine dipstick MODERATE (*)    Protein, ur 100 (*)    Leukocytes, UA LARGE (*)    All other components within normal limits  URINE MICROSCOPIC-ADD ON - Abnormal; Notable for the following:    Squamous Epithelial / LPF FEW (*)    Bacteria, UA MANY (*)    All other components within normal limits  PREGNANCY, URINE    Imaging Review No results found.   EKG Interpretation None      MDM   Final diagnoses:  UTI (lower urinary tract infection)   Patient presenting with urinary frequency, urgency and dysuria. She is nontoxic appearing and in no apparent distress. Afebrile, vital signs stable. No tachycardia on my exam. Urine positive for a UTI. Will treat with Keflex. No CVA tenderness. She deferred GU exam. She is not concerned of STDs at this time. Infection care precautions discussed. Stable for discharge. Return precautions given. Patient states understanding of treatment care plan and is agreeable.  Trevor Mace, PA-C 12/16/13 1320

## 2013-12-16 NOTE — Discharge Instructions (Signed)
Take antibiotic to completion. ° °Urinary Tract Infection °Urinary tract infections (UTIs) can develop anywhere along your urinary tract. Your urinary tract is your body's drainage system for removing wastes and extra water. Your urinary tract includes two kidneys, two ureters, a bladder, and a urethra. Your kidneys are a pair of bean-shaped organs. Each kidney is about the size of your fist. They are located below your ribs, one on each side of your spine. °CAUSES °Infections are caused by microbes, which are microscopic organisms, including fungi, viruses, and bacteria. These organisms are so small that they can only be seen through a microscope. Bacteria are the microbes that most commonly cause UTIs. °SYMPTOMS  °Symptoms of UTIs may vary by age and gender of the patient and by the location of the infection. Symptoms in young women typically include a frequent and intense urge to urinate and a painful, burning feeling in the bladder or urethra during urination. Older women and men are more likely to be tired, shaky, and weak and have muscle aches and abdominal pain. A fever may mean the infection is in your kidneys. Other symptoms of a kidney infection include pain in your back or sides below the ribs, nausea, and vomiting. °DIAGNOSIS °To diagnose a UTI, your caregiver will ask you about your symptoms. Your caregiver also will ask to provide a urine sample. The urine sample will be tested for bacteria and white blood cells. White blood cells are made by your body to help fight infection. °TREATMENT  °Typically, UTIs can be treated with medication. Because most UTIs are caused by a bacterial infection, they usually can be treated with the use of antibiotics. The choice of antibiotic and length of treatment depend on your symptoms and the type of bacteria causing your infection. °HOME CARE INSTRUCTIONS °· If you were prescribed antibiotics, take them exactly as your caregiver instructs you. Finish the medication  even if you feel better after you have only taken some of the medication. °· Drink enough water and fluids to keep your urine clear or pale yellow. °· Avoid caffeine, tea, and carbonated beverages. They tend to irritate your bladder. °· Empty your bladder often. Avoid holding urine for long periods of time. °· Empty your bladder before and after sexual intercourse. °· After a bowel movement, women should cleanse from front to back. Use each tissue only once. °SEEK MEDICAL CARE IF:  °· You have back pain. °· You develop a fever. °· Your symptoms do not begin to resolve within 3 days. °SEEK IMMEDIATE MEDICAL CARE IF:  °· You have severe back pain or lower abdominal pain. °· You develop chills. °· You have nausea or vomiting. °· You have continued burning or discomfort with urination. °MAKE SURE YOU:  °· Understand these instructions. °· Will watch your condition. °· Will get help right away if you are not doing well or get worse. °Document Released: 01/04/2005 Document Revised: 09/26/2011 Document Reviewed: 05/05/2011 °Exit[employer]® Patient Information ©2015 [employer]. This information is not intended to replace advice given to you by your health care provider. Make sure you discuss any questions you have with your health care provider. ° °

## 2013-12-16 NOTE — ED Provider Notes (Signed)
Medical screening examination/treatment/procedure(s) were performed by non-physician practitioner and as supervising physician I was immediately available for consultation/collaboration.     Leighana Neyman, MD 12/16/13 1332 

## 2013-12-16 NOTE — ED Notes (Signed)
Reports frequency, urgency and dysuria. Denies n/v. Also reports nasal congestion

## 2014-03-20 ENCOUNTER — Encounter (HOSPITAL_COMMUNITY): Payer: Self-pay | Admitting: Emergency Medicine

## 2014-03-20 ENCOUNTER — Emergency Department (HOSPITAL_COMMUNITY)
Admission: EM | Admit: 2014-03-20 | Discharge: 2014-03-20 | Disposition: A | Discharge status: Home / Self Care | Payer: Self-pay | Attending: Emergency Medicine | Admitting: Emergency Medicine

## 2014-03-20 ENCOUNTER — Emergency Department (HOSPITAL_COMMUNITY): Payer: Self-pay

## 2014-03-20 DIAGNOSIS — S8001XA Contusion of right knee, initial encounter: Secondary | ICD-10-CM | POA: Insufficient documentation

## 2014-03-20 DIAGNOSIS — Z79899 Other long term (current) drug therapy: Secondary | ICD-10-CM | POA: Insufficient documentation

## 2014-03-20 DIAGNOSIS — Y9389 Activity, other specified: Secondary | ICD-10-CM | POA: Insufficient documentation

## 2014-03-20 DIAGNOSIS — Y92410 Unspecified street and highway as the place of occurrence of the external cause: Secondary | ICD-10-CM | POA: Insufficient documentation

## 2014-03-20 DIAGNOSIS — J45909 Unspecified asthma, uncomplicated: Secondary | ICD-10-CM | POA: Insufficient documentation

## 2014-03-20 DIAGNOSIS — Z792 Long term (current) use of antibiotics: Secondary | ICD-10-CM | POA: Insufficient documentation

## 2014-03-20 DIAGNOSIS — Y998 Other external cause status: Secondary | ICD-10-CM | POA: Insufficient documentation

## 2014-03-20 MED ORDER — MELOXICAM 7.5 MG PO TABS: 15.0000 mg | ORAL_TABLET | Freq: Every day | ORAL | Status: DC
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
## 2014-03-20 NOTE — Discharge Instructions (Signed)
Contusion °A contusion is a deep bruise. Contusions are the result of an injury that caused bleeding under the skin. The contusion may turn blue, purple, or yellow. Minor injuries will give you a painless contusion, but more severe contusions may stay painful and swollen for a few weeks.  °CAUSES  °A contusion is usually caused by a blow, trauma, or direct force to an area of the body. °SYMPTOMS  °· Swelling and redness of the injured area. °· Bruising of the injured area. °· Tenderness and soreness of the injured area. °· Pain. °DIAGNOSIS  °The diagnosis can be made by taking a history and physical exam. An X-ray, CT scan, or MRI may be needed to determine if there were any associated injuries, such as fractures. °TREATMENT  °Specific treatment will depend on what area of the body was injured. In general, the best treatment for a contusion is resting, icing, elevating, and applying cold compresses to the injured area. Over-the-counter medicines may also be recommended for pain control. Ask your caregiver what the best treatment is for your contusion. °HOME CARE INSTRUCTIONS  °· Put ice on the injured area. °· Put ice in a plastic bag. °· Place a towel between your skin and the bag. °· Leave the ice on for 15-20 minutes, 3-4 times a day, or as directed by your health care provider. °· Only take over-the-counter or prescription medicines for pain, discomfort, or fever as directed by your caregiver. Your caregiver may recommend avoiding anti-inflammatory medicines (aspirin, ibuprofen, and naproxen) for 48 hours because these medicines may increase bruising. °· Rest the injured area. °· If possible, elevate the injured area to reduce swelling. °SEEK IMMEDIATE MEDICAL CARE IF:  °· You have increased bruising or swelling. °· You have pain that is getting worse. °· Your swelling or pain is not relieved with medicines. °MAKE SURE YOU:  °· Understand these instructions. °· Will watch your condition. °· Will get help right  away if you are not doing well or get worse. °Document Released: 01/04/2005 Document Revised: 04/01/2013 Document Reviewed: 01/30/2011 °[employer]® Patient Information ©2015 [employer]. This information is not intended to replace advice given to you by your health care provider. Make sure you discuss any questions you have with your health care provider. °RICE: Routine Care for Injuries °The routine care of many injuries includes Rest, Ice, Compression, and Elevation (RICE). °HOME CARE INSTRUCTIONS °· Rest is needed to allow your body to heal. Routine activities can usually be resumed when comfortable. Injured tendons and bones can take up to 6 weeks to heal. Tendons are the cord-like structures that attach muscle to bone. °· Ice following an injury helps keep the swelling down and reduces pain. °¨ Put ice in a plastic bag. °¨ Place a towel between your skin and the bag. °¨ Leave the ice on for 15-20 minutes, 3-4 times a day, or as directed by your health care provider. Do this while awake, for the first 24 to 48 hours. After that, continue as directed by your caregiver. °· Compression helps keep swelling down. It also gives support and helps with discomfort. If an elastic bandage has been applied, it should be removed and reapplied every 3 to 4 hours. It should not be applied tightly, but firmly enough to keep swelling down. Watch fingers or toes for swelling, bluish discoloration, coldness, numbness, or excessive pain. If any of these problems occur, remove the bandage and reapply loosely. Contact your caregiver if these problems continue. °· Elevation helps reduce swelling   and decreases pain. With extremities, such as the arms, hands, legs, and feet, the injured area should be placed near or above the level of the heart, if possible. °SEEK IMMEDIATE MEDICAL CARE IF: °· You have persistent pain and swelling. °· You develop redness, numbness, or unexpected weakness. °· Your symptoms are getting worse rather than  improving after several days. °These symptoms may indicate that further evaluation or further X-rays are needed. Sometimes, X-rays may not show a small broken bone (fracture) until 1 week or 10 days later. Make a follow-up appointment with your caregiver. Ask when your X-ray results will be ready. Make sure you get your X-ray results. °Document Released: 07/09/2000 Document Revised: 04/01/2013 Document Reviewed: 08/26/2010 °[employer]® Patient Information ©2015 [employer]. This information is not intended to replace advice given to you by your health care provider. Make sure you discuss any questions you have with your health care provider. ° °

## 2014-12-01 ENCOUNTER — Emergency Department (HOSPITAL_COMMUNITY)
Admission: EM | Admit: 2014-12-01 | Discharge: 2014-12-01 | Disposition: A | Discharge status: Home / Self Care | Payer: No Typology Code available for payment source | Attending: Emergency Medicine | Admitting: Emergency Medicine

## 2014-12-01 ENCOUNTER — Encounter (HOSPITAL_COMMUNITY): Payer: Self-pay

## 2014-12-01 DIAGNOSIS — J45909 Unspecified asthma, uncomplicated: Secondary | ICD-10-CM | POA: Insufficient documentation

## 2014-12-01 DIAGNOSIS — N898 Other specified noninflammatory disorders of vagina: Secondary | ICD-10-CM | POA: Insufficient documentation

## 2014-12-01 DIAGNOSIS — J069 Acute upper respiratory infection, unspecified: Secondary | ICD-10-CM | POA: Insufficient documentation

## 2014-12-01 DIAGNOSIS — Z79899 Other long term (current) drug therapy: Secondary | ICD-10-CM | POA: Insufficient documentation

## 2014-12-01 DIAGNOSIS — Z3202 Encounter for pregnancy test, result negative: Secondary | ICD-10-CM | POA: Insufficient documentation

## 2014-12-01 LAB — GC/CHLAMYDIA PROBE AMP (~~LOC~~) NOT AT ARMC
Chlamydia: NEGATIVE
Neisseria Gonorrhea: NEGATIVE

## 2014-12-01 LAB — URINALYSIS, ROUTINE W REFLEX MICROSCOPIC
Bilirubin Urine: NEGATIVE
Glucose, UA: NEGATIVE mg/dL
HGB URINE DIPSTICK: NEGATIVE
Ketones, ur: NEGATIVE mg/dL
Nitrite: NEGATIVE
PROTEIN: NEGATIVE mg/dL
Specific Gravity, Urine: 1.027 (ref 1.005–1.030)
UROBILINOGEN UA: 0.2 mg/dL (ref 0.0–1.0)
pH: 6.5 (ref 5.0–8.0)

## 2014-12-01 LAB — WET PREP, GENITAL
Trich, Wet Prep: NONE SEEN
WBC, Wet Prep HPF POC: NONE SEEN
Yeast Wet Prep HPF POC: NONE SEEN

## 2014-12-01 LAB — PREGNANCY, URINE: Preg Test, Ur: NEGATIVE

## 2014-12-01 LAB — URINE MICROSCOPIC-ADD ON

## 2014-12-01 MED ORDER — FLUTICASONE PROPIONATE 50 MCG/ACT NA SUSP: 2.0000 | Freq: Every day | NASAL | Status: DC

## 2014-12-01 MED ORDER — NAPROXEN 500 MG PO TABS: 500.0000 mg | ORAL_TABLET | Freq: Two times a day (BID) | ORAL | Status: DC

## 2014-12-01 MED ORDER — SALINE SPRAY 0.65 % NA SOLN: 1.0000 | NASAL | Status: DC | PRN

## 2014-12-01 NOTE — Discharge Instructions (Signed)
Follow-up on the results of your STD tests with the health department in 2 days. You will be treated if your STD tests return positive. Your urine does not appearing infected. You do not have a yeast infection or bacterial vaginosis. Your congestion is likely from a viral upper respiratory infection. Use Flonase daily and take naproxen. You may use nasal saline spray as needed for additional congestion control. Follow-up with your primary care doctor for further evaluation of symptoms.  Upper Respiratory Infection, Adult An upper respiratory infection (URI) is also sometimes known as the common cold. The upper respiratory tract includes the nose, sinuses, throat, trachea, and bronchi. Bronchi are the airways leading to the lungs. Most people improve within 1 week, but symptoms can last up to 2 weeks. A residual cough may last even longer.  CAUSES Many different viruses can infect the tissues lining the upper respiratory tract. The tissues become irritated and inflamed and often become very moist. Mucus production is also common. A cold is contagious. You can easily spread the virus to others by oral contact. This includes kissing, sharing a glass, coughing, or sneezing. Touching your mouth or nose and then touching a surface, which is then touched by another person, can also spread the virus. SYMPTOMS  Symptoms typically develop 1 to 3 days after you come in contact with a cold virus. Symptoms vary from person to person. They may include:  Runny nose.  Sneezing.  Nasal congestion.  Sinus irritation.  Sore throat.  Loss of voice (laryngitis).  Cough.  Fatigue.  Muscle aches.  Loss of appetite.  Headache.  Low-grade fever. DIAGNOSIS  You might diagnose your own cold based on familiar symptoms, since most people get a cold 2 to 3 times a year. Your caregiver can confirm this based on your exam. Most importantly, your caregiver can check that your symptoms are not due to another disease  such as strep throat, sinusitis, pneumonia, asthma, or epiglottitis. Blood tests, throat tests, and X-rays are not necessary to diagnose a common cold, but they may sometimes be helpful in excluding other more serious diseases. Your caregiver will decide if any further tests are required. RISKS AND COMPLICATIONS  You may be at risk for a more severe case of the common cold if you smoke cigarettes, have chronic heart disease (such as heart failure) or lung disease (such as asthma), or if you have a weakened immune system. The very young and very old are also at risk for more serious infections. Bacterial sinusitis, middle ear infections, and bacterial pneumonia can complicate the common cold. The common cold can worsen asthma and chronic obstructive pulmonary disease (COPD). Sometimes, these complications can require emergency medical care and may be life-threatening. PREVENTION  The best way to protect against getting a cold is to practice good hygiene. Avoid oral or hand contact with people with cold symptoms. Wash your hands often if contact occurs. There is no clear evidence that vitamin C, vitamin E, echinacea, or exercise reduces the chance of developing a cold. However, it is always recommended to get plenty of rest and practice good nutrition. TREATMENT  Treatment is directed at relieving symptoms. There is no cure. Antibiotics are not effective, because the infection is caused by a virus, not by bacteria. Treatment may include:  Increased fluid intake. Sports drinks offer valuable electrolytes, sugars, and fluids.  Breathing heated mist or steam (vaporizer or shower).  Eating chicken soup or other clear broths, and maintaining good nutrition.  Getting plenty of  rest.  Using gargles or lozenges for comfort.  Controlling fevers with ibuprofen or acetaminophen as directed by your caregiver.  Increasing usage of your inhaler if you have asthma. Zinc gel and zinc lozenges, taken in the first  24 hours of the common cold, can shorten the duration and lessen the severity of symptoms. Pain medicines may help with fever, muscle aches, and throat pain. A variety of non-prescription medicines are available to treat congestion and runny nose. Your caregiver can make recommendations and may suggest nasal or lung inhalers for other symptoms.  HOME CARE INSTRUCTIONS   Only take over-the-counter or prescription medicines for pain, discomfort, or fever as directed by your caregiver.  Use a warm mist humidifier or inhale steam from a shower to increase air moisture. This may keep secretions moist and make it easier to breathe.  Drink enough water and fluids to keep your urine clear or pale yellow.  Rest as needed.  Return to work when your temperature has returned to normal or as your caregiver advises. You may need to stay home longer to avoid infecting others. You can also use a face mask and careful hand washing to prevent spread of the virus. SEEK MEDICAL CARE IF:   After the first few days, you feel you are getting worse rather than better.  You need your caregiver's advice about medicines to control symptoms.  You develop chills, worsening shortness of breath, or brown or red sputum. These may be signs of pneumonia.  You develop yellow or brown nasal discharge or pain in the face, especially when you bend forward. These may be signs of sinusitis.  You develop a fever, swollen neck glands, pain with swallowing, or white areas in the back of your throat. These may be signs of strep throat. SEEK IMMEDIATE MEDICAL CARE IF:   You have a fever.  You develop severe or persistent headache, ear pain, sinus pain, or chest pain.  You develop wheezing, a prolonged cough, cough up blood, or have a change in your usual mucus (if you have chronic lung disease).  You develop sore muscles or a stiff neck. Document Released: 09/20/2000 Document Revised: 06/19/2011 Document Reviewed:  07/02/2013 [facility] Patient Information 2015 [place], [place]. This information is not intended to replace advice given to you by your health care provider. Make sure you discuss any questions you have with your health care provider.  Safe Sex Safe sex is about reducing the risk of giving or getting a sexually transmitted disease (STD). STDs are spread through sexual contact involving the genitals, mouth, or rectum. Some STDs can be cured and others cannot. Safe sex can also prevent unintended pregnancies.  WHAT ARE SOME SAFE SEX PRACTICES?  Limit your sexual activity to only one partner who is having sex with only you.  Talk to your partner about his or her past partners, past STDs, and drug use.  Use a condom every time you have sexual intercourse. This includes vaginal, oral, and anal sexual activity. Both females and males should wear condoms during oral sex. Only use latex or polyurethane condoms and water-based lubricants. Using petroleum-based lubricants or oils to lubricate a condom will weaken the condom and increase the chance that it will break. The condom should be in place from the beginning to the end of sexual activity. Wearing a condom reduces, but does not completely eliminate, your risk of getting or giving an STD. STDs can be spread by contact with infected body fluids and skin.  Get vaccinated  for hepatitis B and HPV.  Avoid alcohol and recreational drugs, which can affect your judgment. You may forget to use a condom or participate in high-risk sex.  For females, avoid douching after sexual intercourse. Douching can spread an infection farther into the reproductive tract.  Check your body for signs of sores, blisters, rashes, or unusual discharge. See your health care provider if you notice any of these signs.  Avoid sexual contact if you have symptoms of an infection or are being treated for an STD. If you or your partner has herpes, avoid sexual contact when blisters are  present. Use condoms at all other times.  If you are at risk of being infected with HIV, it is recommended that you take a prescription medicine daily to prevent HIV infection. This is called pre-exposure prophylaxis (PrEP). You are considered at risk if:  You are a man who has sex with other men (MSM).  You are a heterosexual man or woman who is sexually active with more than one partner.  You take drugs by injection.  You are sexually active with a partner who has HIV.  Talk with your health care provider about whether you are at high risk of being infected with HIV. If you choose to begin PrEP, you should first be tested for HIV. You should then be tested every 3 months for as long as you are taking PrEP.  See your health care provider for regular screenings, exams, and tests for other STDs. Before having sex with a new partner, each of you should be screened for STDs and should talk about the results with each other. WHAT ARE THE BENEFITS OF SAFE SEX?   There is less chance of getting or giving an STD.  You can prevent unwanted or unintended pregnancies.  By discussing safe sex concerns with your partner, you may increase feelings of intimacy, comfort, trust, and honesty between the two of you. Document Released: 05/04/2004 Document Revised: 08/11/2013 Document Reviewed: 09/18/2011 [facility] Patient Information 2015 [place], [place]. This information is not intended to replace advice given to you by your health care provider. Make sure you discuss any questions you have with your health care provider.

## 2014-12-01 NOTE — ED Notes (Signed)
Pt states she has not had a period since June 10th. She had her implant removed in June, but it expired in March. Pt also has bumps on in her groin area. Pt has had weight gain and had sore breasts  Pt also has congestion, but her lungs sound clear.

## 2014-12-01 NOTE — ED Provider Notes (Signed)
CSN: [id number]     Arrival date & time 12/01/14  0202 History   First MD Initiated Contact with Patient 12/01/14 0425     Chief Complaint  Patient presents with  . Fever     (Consider location/radiation/quality/duration/timing/severity/associated sxs/prior Treatment) HPI Comments: 24 year old female with a history of asthma and seasonal allergies presents to the emergency department for multiple complaints. Patient states that she had her Implanon removed in June. Her last menstrual period was 09/18/2014. Since having the Implanon removed she has noticed some worsening vaginal discharge and vaginal odor. She also had some weight gain and complains of sore breasts on occasion. Patient has noticed some bumps in her groin area. She initially thought they were "hair bumps", but now she is concerned that she has an STD. She reports being sexually active with 2 partners in the last 6 months. She reports intermittent condom use.  Patient also complaining of nasal congestion which began today. She endorses associated chills, rhinorrhea, and sneezing. Patient took a vitamin C tablet for symptoms without significant relief. She has recently been around her niece was sick with similar symptoms. No sore throat or cough. No fever on arrival to the ED. Patient denies shortness of breath and abdominal pain.  Patient is a 24 y.o. female presenting with fever. The history is provided by the patient. No language interpreter was used.  Fever Associated symptoms: congestion and rhinorrhea   Associated symptoms: no cough, no dysuria, no nausea, no sore throat and no vomiting     Past Medical History  Diagnosis Date  . Asthma   . Environmental allergies    History reviewed. No pertinent past surgical history. History reviewed. No pertinent family history. Social History  Substance Use Topics  . Smoking status: Never Smoker   . Smokeless tobacco: None  . Alcohol Use: Yes     Comment: occ   OB History    No data available      Review of Systems  Constitutional: Positive for fever.  HENT: Positive for congestion, rhinorrhea, sinus pressure and sneezing. Negative for drooling and sore throat.   Respiratory: Negative for cough and shortness of breath.   Gastrointestinal: Negative for nausea and vomiting.  Genitourinary: Positive for vaginal discharge and genital sores. Negative for dysuria.  All other systems reviewed and are negative.   Allergies  Review of patient's allergies indicates no known allergies.  Home Medications   Prior to Admission medications   Medication Sig Start Date End Date Taking? Authorizing Provider  Multiple Vitamins-Minerals (IMMUNE SUPPORT VITAMIN C PO) Take 1 each by mouth once.   Yes Historical Provider, MD  Alum & Mag Hydroxide-Simeth (MAGIC MOUTHWASH W/LIDOCAINE) SOLN Take 5 mLs by mouth 3 (three) times daily as needed for mouth pain. Swish and spit, do not swallow Patient not taking: Reported on 12/01/2014 07/13/13   Tammy Triplett, PA-C  amoxicillin (AMOXIL) 500 MG capsule Take 1 capsule (500 mg total) by mouth 3 (three) times daily. For 10 days Patient not taking: Reported on 12/01/2014 07/13/13   Tammy Triplett, PA-C  cephALEXin (KEFLEX) 500 MG capsule Take 1 capsule (500 mg total) by mouth 4 (four) times daily. Take all of medicine and drink lots of fluids Patient not taking: Reported on 12/01/2014 08/09/13   Linna Hoff, MD  cephALEXin (KEFLEX) 500 MG capsule Take 1 capsule (500 mg total) by mouth 4 (four) times daily. Patient not taking: Reported on 12/01/2014 12/16/13   Kathrynn Speed, PA-C  HYDROcodone-acetaminophen (NORCO/VICODIN) 5-325 MG  per tablet Take 1 tablet by mouth every 4 (four) hours as needed for pain. Patient not taking: Reported on 12/01/2014 07/21/12   Ivery Quale, PA-C  meloxicam (MOBIC) 7.5 MG tablet Take 2 tablets (15 mg total) by mouth daily. Patient not taking: Reported on 12/01/2014 03/20/14   Antony Madura, PA-C  pseudoephedrine (SUDAFED) 60  MG tablet Take 1 tablet (60 mg total) by mouth every 4 (four) hours as needed for congestion. Patient not taking: Reported on 12/01/2014 07/13/13   Tammy Triplett, PA-C   BP 121/68 mmHg  Pulse 90  Temp(Src) 97.5 F (36.4 C) (Oral)  Resp 18  Ht 5\' 4"  (1.626 m)  Wt 330 lb (149.687 kg)  BMI 56.62 kg/m2  SpO2 98%  LMP 09/23/2014   Physical Exam  Constitutional: She is oriented to person, place, and time. She appears well-developed and well-nourished. No distress.  Nontoxic/nonseptic appearing  HENT:  Head: Normocephalic and atraumatic.  Mouth/Throat: Oropharynx is clear and moist. No oropharyngeal exudate.  Audible nasal congestion. Mild right middle ear effusion. No bulging, retraction, or perforation of bilateral TMs. Uvula midline. No posterior oropharyngeal erythema or edema. Patient tolerating secretions without difficulty.  Eyes: Conjunctivae and EOM are normal. Pupils are equal, round, and reactive to light. No scleral icterus.  Neck: Normal range of motion.  No nuchal rigidity or meningismus  Cardiovascular: Normal rate, regular rhythm and intact distal pulses.   Pulmonary/Chest: Effort normal. No respiratory distress.  Respirations even and unlabored  Genitourinary: Uterus normal. There is no tenderness, lesion or injury on the right labia. There is no tenderness, lesion or injury on the left labia. Uterus is not tender. Cervix exhibits no motion tenderness and no friability. Right adnexum displays no mass, no tenderness and no fullness. Left adnexum displays no mass, no tenderness and no fullness. No bleeding in the vagina. Vaginal discharge (white, creamy) found.  Scattered soft tissue swelling to labia majora associated with hair follicles. No vesicles or warty lesions.  Musculoskeletal: Normal range of motion.  Neurological: She is alert and oriented to person, place, and time. She exhibits normal muscle tone. Coordination normal.  Skin: Skin is warm and dry. No rash noted. She  is not diaphoretic. No erythema. No pallor.  Psychiatric: She has a normal mood and affect. Her behavior is normal.  Nursing note and vitals reviewed.   ED Course  Procedures (including critical care time) Labs Review Labs Reviewed  WET PREP, GENITAL - Abnormal; Notable for the following:    Clue Cells Wet Prep HPF POC FEW (*)    All other components within normal limits  URINALYSIS, ROUTINE W REFLEX MICROSCOPIC (NOT AT [facility]) - Abnormal; Notable for the following:    APPearance CLOUDY (*)    Leukocytes, UA SMALL (*)    All other components within normal limits  PREGNANCY, URINE  URINE MICROSCOPIC-ADD ON  GC/CHLAMYDIA PROBE AMP ([place]) NOT AT [facility]    Imaging Review No results found.   I have personally reviewed and evaluated these images and lab results as part of my medical decision-making.   EKG Interpretation None      MDM   Final diagnoses:  Viral URI  Vaginal discharge    Patient complaining of symptoms of URI. Mild to moderate symptoms of clear/yellow nasal discharge/congestion, sneezing, and sinus pressure for less than 10 days. Patient is afebrile. No concern for acute bacterial rhinosinusitis; likely viral in nature. Patient also concerned about vaginal discharge which has been heavy since her Implanon was removed.  No cervical motion or adnexal tenderness on exam. Urinalysis does not suggest infection. Likely that vaginal discharge is normal for the patient. She does have gonorrhea and chlamydia cultures pending.  Patient discharged with symptomatic treatment. Have also advised follow-up with the health department in 2 days regarding the results of patient's gonorrhea and chlamydia cultures. Patient instructions given for warm saline nasal washes. Recommendations for follow-up with primary care physician. Return precautions discussed and provided. Patient agreeable to plan with no unaddressed concerns. Patient discharged in good condition.   Filed Vitals:    12/01/14 0226 12/01/14 0435 12/01/14 0456 12/01/14 0557  BP: 121/105 121/68  140/85  Pulse: 90   82  Temp: 98.1 F (36.7 C)  97.5 F (36.4 C)   TempSrc: Oral  Oral   Resp: 18   22  Height: 5\' 4"  (1.626 m)     Weight: 330 lb (149.687 kg)     SpO2: 98%   95%     Antony Madura, PA-C 12/01/14 0606  Cy Blamer, MD 12/01/14 [phone]

## 2014-12-01 NOTE — ED Notes (Signed)
Pt complains of a fever, which she currently doesn't have in triage, she also complains of bumps on her vaginal area which she doesn't want the friend that's with her to know, she also had her impland removed in June and hasn't had a period since then.

## 2014-12-01 NOTE — ED Notes (Signed)
Pelvic supplies at the bedside.

## 2015-04-27 ENCOUNTER — Encounter (HOSPITAL_COMMUNITY): Payer: Self-pay | Admitting: Family Medicine

## 2015-04-27 ENCOUNTER — Emergency Department (HOSPITAL_COMMUNITY)
Admission: EM | Admit: 2015-04-27 | Discharge: 2015-04-27 | Disposition: A | Discharge status: Home / Self Care | Payer: Medicaid Other | Attending: Emergency Medicine | Admitting: Emergency Medicine

## 2015-04-27 DIAGNOSIS — J45909 Unspecified asthma, uncomplicated: Secondary | ICD-10-CM | POA: Diagnosis not present

## 2015-04-27 DIAGNOSIS — N939 Abnormal uterine and vaginal bleeding, unspecified: Secondary | ICD-10-CM

## 2015-04-27 DIAGNOSIS — Z3202 Encounter for pregnancy test, result negative: Secondary | ICD-10-CM | POA: Diagnosis not present

## 2015-04-27 LAB — HCG, QUANTITATIVE, PREGNANCY: hCG, Beta Chain, Quant, S: <1 m[IU]/mL (ref <5)

## 2015-04-27 NOTE — ED Notes (Signed)
Patient reports she went to the health department on 12/30 due to her menstrual cycle was flowing for a month constant. Pt found out she was pregnant. Pt has a scheduled OB/GYN appointment on 05/10/2015. Pt reports she was lying down and started experiencing vaginal bleeding. Pt reports she had a menstrual cycle since Christmas.

## 2015-04-27 NOTE — Discharge Instructions (Signed)
Please read attached information. If you experience any new or worsening signs or symptoms please return to the emergency room for evaluation. Please follow-up with your primary care provider or specialist as discussed. Please use medication prescribed only as directed and discontinue taking if you have any concerning signs or symptoms.   °

## 2015-04-27 NOTE — ED Provider Notes (Signed)
CSN: [id number]     Arrival date & time 04/27/15  0014 History   First MD Initiated Contact with Patient 04/27/15 [phone]     Chief Complaint  Patient presents with  . Vaginal Bleeding    HPI   25 year old female presents today with vaginal bleeding. Patient reports that June 2016 she discontinued her birth control. She notes light spotting throughout the month of September. She reports that in the months of November and December she's had intermittent spotting as well and today had what appeared to be a natural-like bleeding. Patient notes that she was seen [facility] the [facility] on 03/30/2015 with a positive pregnancy test. She notes spotting at that time and since then. She has not had any follow-up care since this positive pregnancy test but does report that after the pregnancy test at the health center she took one on her own at home which was negative. She reports the paperwork was confusing that she had from the health department and had both positive and negative written on it. Patient denies any abdominal pain, vaginal discharge, large amount of bleeding, reporting using one pad today that has not been soaked through. She denies any passage of tissue.   Past Medical History  Diagnosis Date  . Asthma   . Environmental allergies    History reviewed. No pertinent past surgical history. History reviewed. No pertinent family history. Social History  Substance Use Topics  . Smoking status: Never Smoker   . Smokeless tobacco: None  . Alcohol Use: No   OB History    No data available     Review of Systems  All other systems reviewed and are negative.   Allergies  Review of patient's allergies indicates no known allergies.  Home Medications   Prior to Admission medications   Medication Sig Start Date End Date Taking? Authorizing Provider  Alum & Mag Hydroxide-Simeth (MAGIC MOUTHWASH W/LIDOCAINE) SOLN Take 5 mLs by mouth 3 (three) times daily as needed for mouth pain. Swish and  spit, do not swallow Patient not taking: Reported on 12/01/2014 07/13/13   Tammy Triplett, PA-C  amoxicillin (AMOXIL) 500 MG capsule Take 1 capsule (500 mg total) by mouth 3 (three) times daily. For 10 days Patient not taking: Reported on 12/01/2014 07/13/13   Tammy Triplett, PA-C  cephALEXin (KEFLEX) 500 MG capsule Take 1 capsule (500 mg total) by mouth 4 (four) times daily. Take all of medicine and drink lots of fluids Patient not taking: Reported on 12/01/2014 08/09/13   Linna Hoff, MD  cephALEXin (KEFLEX) 500 MG capsule Take 1 capsule (500 mg total) by mouth 4 (four) times daily. Patient not taking: Reported on 12/01/2014 12/16/13   Kathrynn Speed, PA-C  fluticasone [facility]) 50 MCG/ACT nasal spray Place 2 sprays into both nostrils daily. Patient not taking: Reported on 04/27/2015 12/01/14   Antony Madura, PA-C  HYDROcodone-acetaminophen (NORCO/VICODIN) 5-325 MG per tablet Take 1 tablet by mouth every 4 (four) hours as needed for pain. Patient not taking: Reported on 12/01/2014 07/21/12   Ivery Quale, PA-C  meloxicam (MOBIC) 7.5 MG tablet Take 2 tablets (15 mg total) by mouth daily. Patient not taking: Reported on 12/01/2014 03/20/14   Antony Madura, PA-C  naproxen (NAPROSYN) 500 MG tablet Take 1 tablet (500 mg total) by mouth 2 (two) times daily. Patient not taking: Reported on 04/27/2015 12/01/14   Antony Madura, PA-C  pseudoephedrine (SUDAFED) 60 MG tablet Take 1 tablet (60 mg total) by mouth every 4 (four) hours as needed  for congestion. Patient not taking: Reported on 12/01/2014 07/13/13   Tammy Triplett, PA-C  sodium chloride (OCEAN) 0.65 % SOLN nasal spray Place 1 spray into both nostrils as needed for congestion. Patient not taking: Reported on 04/27/2015 12/01/14   Antony Madura, PA-C   BP 118/90 mmHg  Pulse 72  Temp(Src) 98.4 F (36.9 C) (Oral)  Resp 16  Ht  (1.626 m)  Wt 155.13 kg  BMI 58.68 kg/m2  SpO2 97%  LMP 03/02/2015   Physical Exam  Constitutional: She is oriented to person, place, and  time. She appears well-developed and well-nourished.  Morbidly obese  HENT:  Head: Normocephalic and atraumatic.  Eyes: Conjunctivae are normal. Pupils are equal, round, and reactive to light. Right eye exhibits no discharge. Left eye exhibits no discharge. No scleral icterus.  Neck: Normal range of motion. No JVD present. No tracheal deviation present.  Pulmonary/Chest: Effort normal. No stridor.  Abdominal: She exhibits no distension and no mass. There is no tenderness. There is no rebound and no guarding.  Genitourinary:  Closed cervix, no active bleeding, small amount of blood in the vaginal vault no discharge noted  Neurological: She is alert and oriented to person, place, and time. Coordination normal.  Skin: Skin is warm and dry.  Psychiatric: She has a normal mood and affect. Her behavior is normal. Judgment and thought content normal.  Nursing note and vitals reviewed.   ED Course  Procedures (including critical care time) Labs Review Labs Reviewed  HCG, QUANTITATIVE, PREGNANCY  ABO/RH    Imaging Review No results found. I have personally reviewed and evaluated these images and lab results as part of my medical decision-making.   EKG Interpretation None      MDM   Final diagnoses:  Vaginal bleeding    Labs : hCG Quant less than 1  Imaging:  Consults:  Therapeutics:  Discharge Meds:   Assessment/Plan: 25 year old female presents today with vaginal bleeding. Patient has had vaginal spotting for several months with a reported positive pregnancy test. Patient also reports a negative pregnancy test around the same time. Patient has had worsening bleeding today over her regular spotting, no active bleeding on my exam. She does not appear to have lost significant amount of blood as she had not eaten soaked 1 pad, she has reassuring vital signs in addition. Her hCG Sharene Butters was less than 1 here today, I questioned if patient was previously pregnant. Due to small amount  of blood loss, no abdominal pain, no signs of significant orthostatic changes patient will be discharged home and instructed to follow up with OB/GYN for further evaluation and management. She has no other signs of bleeding here today. She is instructed to return to the emergency room if any new or worsening signs or symptoms present. Patient verbalized understanding and agreement for today's plan and no further questions or concerns at the time of discharge       Eyvonne Mechanic, PA-C 04/27/15 2150  Laurence Spates, MD 04/28/15 2006

## 2015-04-28 LAB — ABO/RH: ABO/RH(D): B NEG

## 2015-09-09 ENCOUNTER — Encounter (HOSPITAL_COMMUNITY): Payer: Self-pay | Admitting: *Deleted

## 2015-09-09 ENCOUNTER — Inpatient Hospital Stay (HOSPITAL_COMMUNITY)
Admission: AD | Admit: 2015-09-09 | Discharge: 2015-09-09 | Disposition: A | Discharge status: Home / Self Care | Payer: Medicaid Other | Source: Ambulatory Visit | Attending: Obstetrics and Gynecology | Admitting: Obstetrics and Gynecology

## 2015-09-09 DIAGNOSIS — K589 Irritable bowel syndrome without diarrhea: Secondary | ICD-10-CM

## 2015-09-09 DIAGNOSIS — N939 Abnormal uterine and vaginal bleeding, unspecified: Secondary | ICD-10-CM | POA: Insufficient documentation

## 2015-09-09 DIAGNOSIS — N72 Inflammatory disease of cervix uteri: Secondary | ICD-10-CM | POA: Insufficient documentation

## 2015-09-09 DIAGNOSIS — Z3202 Encounter for pregnancy test, result negative: Secondary | ICD-10-CM | POA: Insufficient documentation

## 2015-09-09 LAB — CBC
HEMATOCRIT: 40 % (ref 36.0–46.0)
HEMOGLOBIN: 13.7 g/dL (ref 12.0–15.0)
MCH: 26.4 pg (ref 26.0–34.0)
MCHC: 34.3 g/dL (ref 30.0–36.0)
MCV: 77.2 fL — ABNORMAL LOW (ref 78.0–100.0)
Platelets: 150 10*3/uL (ref 150–400)
RBC: 5.18 MIL/uL — AB (ref 3.87–5.11)
RDW: 14.9 % (ref 11.5–15.5)
WBC: 9.3 10*3/uL (ref 4.0–10.5)

## 2015-09-09 LAB — URINE MICROSCOPIC-ADD ON

## 2015-09-09 LAB — URINALYSIS, ROUTINE W REFLEX MICROSCOPIC
Bilirubin Urine: NEGATIVE
Glucose, UA: NEGATIVE mg/dL
Ketones, ur: NEGATIVE mg/dL
Leukocytes, UA: NEGATIVE
Nitrite: NEGATIVE
Protein, ur: NEGATIVE mg/dL
Specific Gravity, Urine: 1.02 (ref 1.005–1.030)
pH: 6 (ref 5.0–8.0)

## 2015-09-09 LAB — WET PREP, GENITAL
Clue Cells Wet Prep HPF POC: NONE SEEN
SPERM: NONE SEEN
Trich, Wet Prep: NONE SEEN
YEAST WET PREP: NONE SEEN

## 2015-09-09 LAB — POCT PREGNANCY, URINE: Preg Test, Ur: NEGATIVE

## 2015-09-09 MED ORDER — NORGESTIMATE-ETH ESTRADIOL 0.25-35 MG-MCG PO TABS: 1.0000 | ORAL_TABLET | Freq: Every day | ORAL | Status: DC

## 2015-09-09 MED ORDER — CEFTRIAXONE SODIUM 250 MG IJ SOLR
250.0000 mg | Freq: Once | INTRAMUSCULAR | Status: AC
  Administered 2015-09-09: 250 mg via INTRAMUSCULAR
  Filled 2015-09-09: qty 250

## 2015-09-09 MED ORDER — AZITHROMYCIN 250 MG PO TABS
1000.0000 mg | ORAL_TABLET | Freq: Once | ORAL | Status: AC
  Administered 2015-09-09: 1000 mg via ORAL
  Filled 2015-09-09: qty 4

## 2015-09-09 NOTE — MAU Provider Note (Signed)
History     CSN: [id number]  Arrival date and time: 09/09/15 1312   None     Chief Complaint  Patient presents with  . Vaginal Bleeding   HPIpt is not pregnant and had Nexplanon removed in June 2016. Pt had irregular periods after removal and missed 2 months of periods then pt had continued bleeding 3 months like a regular period and [facility]  Pt was also dx with trichand given Flagyl 2 gm.  Pt thinks Flagyl killed her baby..  Pt had a heavy period or heavy bleeding in January and assumed she was having a miscarriage- she went to hospital HCG<1 at ED. Pt states she has had cramping that comes and goes.  Pt also states that she  Has constipation and diarrhea.  Pt says she was tested for STDs including HIV at [facility] last week (lab results not available). Pt states she had BV and given a Rx for Flagyl but did not take because she thought she might be pregnant and it is the same medicine that she took before she had the miscarriage. Pt is also B NEG- did not have Rhogam- pt states she had a + and neg so unsure if she was really pregnant. Pt states she has been going to [employer] and .has lost 10 pounds RN note:  Related Notes: Original Note by Madaline SavageKathrine M Wilson, RN (Registered Nurse) filed at 09/09/2015 1:31 PM   Expand All Collapse All   Pt stated she had a period last month. Bled for most of the month. Went to the health department last week and had a STD screening and dx with BV. They did not address her bleeding at all according to pt. Bleeding stopped for a couple of days and started up again this morning.         Past Medical History  Diagnosis Date  . Asthma   . Environmental allergies     No past surgical history on file.  No family history on file.  Social History  Substance Use Topics  . Smoking status: Never Smoker   . Smokeless tobacco: Not on file  . Alcohol Use: No    Allergies: No Known Allergies  Prescriptions  prior to admission  Medication Sig Dispense Refill Last Dose  . Alum & Mag Hydroxide-Simeth (MAGIC MOUTHWASH W/LIDOCAINE) SOLN Take 5 mLs by mouth 3 (three) times daily as needed for mouth pain. Swish and spit, do not swallow (Patient not taking: Reported on 12/01/2014) 60 mL 0 Unknown at Unknown time  . amoxicillin (AMOXIL) 500 MG capsule Take 1 capsule (500 mg total) by mouth 3 (three) times daily. For 10 days (Patient not taking: Reported on 12/01/2014) 30 capsule 0 Unknown at Unknown time  . cephALEXin (KEFLEX) 500 MG capsule Take 1 capsule (500 mg total) by mouth 4 (four) times daily. Take all of medicine and drink lots of fluids (Patient not taking: Reported on 12/01/2014) 20 capsule 0   . cephALEXin (KEFLEX) 500 MG capsule Take 1 capsule (500 mg total) by mouth 4 (four) times daily. (Patient not taking: Reported on 12/01/2014) 28 capsule 0   . fluticasone (FLONASE) 50 MCG/ACT nasal spray Place 2 sprays into both nostrils daily. (Patient not taking: Reported on 04/27/2015) 16 g 0   . HYDROcodone-acetaminophen (NORCO/VICODIN) 5-325 MG per tablet Take 1 tablet by mouth every 4 (four) hours as needed for pain. (Patient not taking: Reported on 12/01/2014) 15 tablet 0 Unknown at  Unknown time  . meloxicam (MOBIC) 7.5 MG tablet Take 2 tablets (15 mg total) by mouth daily. (Patient not taking: Reported on 12/01/2014) 30 tablet 0   . naproxen (NAPROSYN) 500 MG tablet Take 1 tablet (500 mg total) by mouth 2 (two) times daily. (Patient not taking: Reported on 04/27/2015) 30 tablet 0   . pseudoephedrine (SUDAFED) 60 MG tablet Take 1 tablet (60 mg total) by mouth every 4 (four) hours as needed for congestion. (Patient not taking: Reported on 12/01/2014) 30 tablet 0 Unknown at Unknown time  . sodium chloride (OCEAN) 0.65 % SOLN nasal spray Place 1 spray into both nostrils as needed for congestion. (Patient not taking: Reported on 04/27/2015) 1 Bottle 0     Review of Systems  Constitutional: Negative for fever and  chills.  Gastrointestinal: Positive for abdominal pain, diarrhea and constipation. Negative for nausea and vomiting.   Physical Exam   Blood pressure 93/63, pulse 93, temperature 97.3 F (36.3 C), temperature source Oral, resp. rate 18, height 5\' 4"  (1.626 m), weight 346 lb 1.9 oz (156.999 kg), last menstrual period 09/09/2015.  Physical Exam  Nursing note and vitals reviewed. Constitutional: She is oriented to person, place, and time. She appears well-developed and well-nourished. No distress.  HENT:  Head: Normocephalic.  Eyes: Pupils are equal, round, and reactive to light.  Neck: Normal range of motion. Neck supple.  Cardiovascular: Normal rate.   Respiratory: Effort normal. No respiratory distress.  GI: Soft. She exhibits no distension. There is no tenderness. There is no rebound and no guarding.  protrubent abdomen complicating exam; abd soft nontender with palpation  Genitourinary:  Small amount of bright red blood from cervical os; cervix tender with manipulation; uterus retroverted without appreciable enlargement ; adnexa without palpable enlargement- diffusely tender  Musculoskeletal: Normal range of motion.  Neurological: She is alert and oriented to person, place, and time.  Skin: Skin is warm.  Psychiatric: She has a normal mood and affect.    MAU Course  Procedures Results for orders placed or performed during the hospital encounter of 09/09/15 (from the past 24 hour(s))  Urinalysis, Routine w reflex microscopic (not at [facility])     Status: Abnormal   Collection Time: 09/09/15  1:30 PM  Result Value Ref Range   Color, Urine AMBER (A) YELLOW   APPearance HAZY (A) CLEAR   Specific Gravity, Urine 1.020 1.005 - 1.030   pH 6.0 5.0 - 8.0   Glucose, UA NEGATIVE NEGATIVE mg/dL   Hgb urine dipstick LARGE (A) NEGATIVE   Bilirubin Urine NEGATIVE NEGATIVE   Ketones, ur NEGATIVE NEGATIVE mg/dL   Protein, ur NEGATIVE NEGATIVE mg/dL   Nitrite NEGATIVE NEGATIVE   Leukocytes, UA  NEGATIVE NEGATIVE  Urine microscopic-add on     Status: Abnormal   Collection Time: 09/09/15  1:30 PM  Result Value Ref Range   Squamous Epithelial / LPF 0-5 (A) NONE SEEN   WBC, UA 0-5 0 - 5 WBC/hpf   RBC / HPF TOO NUMEROUS TO COUNT 0 - 5 RBC/hpf   Bacteria, UA MANY (A) NONE SEEN   Urine-Other MUCOUS PRESENT   Pregnancy, urine POC     Status: None   Collection Time: 09/09/15  1:45 PM  Result Value Ref Range   Preg Test, Ur NEGATIVE NEGATIVE  CBC     Status: Abnormal   Collection Time: 09/09/15  1:52 PM  Result Value Ref Range   WBC 9.3 4.0 - 10.5 K/uL   RBC 5.18 (H) 3.87 -  5.11 MIL/uL   Hemoglobin 13.7 12.0 - 15.0 g/dL   HCT 96.0 45.4 - 09.8 %   MCV 77.2 (L) 78.0 - 100.0 fL   MCH 26.4 26.0 - 34.0 pg   MCHC 34.3 30.0 - 36.0 g/dL   RDW 11.9 14.7 - 82.9 %   Platelets 150 150 - 400 K/uL  Wet prep, genital     Status: Abnormal   Collection Time: 09/09/15  2:20 PM  Result Value Ref Range   Yeast Wet Prep HPF POC NONE SEEN NONE SEEN   Trich, Wet Prep NONE SEEN NONE SEEN   Clue Cells Wet Prep HPF POC NONE SEEN NONE SEEN   WBC, Wet Prep HPF POC FEW (A) NONE SEEN   Sperm NONE SEEN   GC/chlamydia pending Because of +CMT, AUB, and hx of trich, will treat with Rocephin and zithromax   Assessment and Plan  Abnormal uterine bleeding- Sprintec- start today- take one daily Suspect PCO- follow up [facility] for continued AUB  abd cramping- suspect IBS-information and diet reviewed Cervicitis- Rx in MAU  Armanii Urbanik 09/09/2015, 1:46 PM

## 2015-09-09 NOTE — MAU Note (Addendum)
C/o heavy vaginal bleeding for past month; UPT was neg today; [facility]; had implanon removed last June; c/o cramping with the bleeding; pt's periods have been irregular since her implanon removed;

## 2015-09-09 NOTE — MAU Note (Addendum)
Pt stated she had a period last month. Bled for most of the month. Went to [facility] last week and had a STD screening and dx with BV. They did not address her bleeding at all according to pt.  Bleeding stopped for a couple of days and started up again this morning.

## 2015-09-10 LAB — GC/CHLAMYDIA PROBE AMP (~~LOC~~) NOT AT ARMC
Chlamydia: NEGATIVE
Neisseria Gonorrhea: NEGATIVE

## 2015-11-26 ENCOUNTER — Emergency Department (HOSPITAL_COMMUNITY)
Admission: EM | Admit: 2015-11-26 | Discharge: 2015-11-26 | Disposition: A | Discharge status: Home / Self Care | Payer: Medicaid Other | Attending: Dermatology | Admitting: Dermatology

## 2015-11-26 ENCOUNTER — Encounter (HOSPITAL_COMMUNITY): Payer: Self-pay

## 2015-11-26 DIAGNOSIS — Z5321 Procedure and treatment not carried out due to patient leaving prior to being seen by health care provider: Secondary | ICD-10-CM | POA: Insufficient documentation

## 2015-11-26 DIAGNOSIS — J45909 Unspecified asthma, uncomplicated: Secondary | ICD-10-CM | POA: Diagnosis not present

## 2015-11-26 DIAGNOSIS — Z79899 Other long term (current) drug therapy: Secondary | ICD-10-CM | POA: Insufficient documentation

## 2015-11-26 DIAGNOSIS — K0889 Other specified disorders of teeth and supporting structures: Secondary | ICD-10-CM | POA: Insufficient documentation

## 2015-11-26 NOTE — ED Triage Notes (Signed)
Pt apparently told registration clerk she was not going to wait to be seen and left the waiting area.

## 2015-11-26 NOTE — ED Triage Notes (Signed)
Right side upper and lower dental pain for couple of months off and on, taking otc meds without relief

## 2016-01-22 ENCOUNTER — Emergency Department (HOSPITAL_COMMUNITY): Payer: Self-pay

## 2016-01-22 ENCOUNTER — Encounter (HOSPITAL_COMMUNITY): Payer: Self-pay | Admitting: *Deleted

## 2016-01-22 ENCOUNTER — Emergency Department (HOSPITAL_COMMUNITY)
Admission: EM | Admit: 2016-01-22 | Discharge: 2016-01-23 | Disposition: A | Discharge status: Home / Self Care | Payer: Self-pay | Attending: Emergency Medicine | Admitting: Emergency Medicine

## 2016-01-22 DIAGNOSIS — K029 Dental caries, unspecified: Secondary | ICD-10-CM

## 2016-01-22 DIAGNOSIS — K219 Gastro-esophageal reflux disease without esophagitis: Secondary | ICD-10-CM

## 2016-01-22 DIAGNOSIS — J45909 Unspecified asthma, uncomplicated: Secondary | ICD-10-CM | POA: Insufficient documentation

## 2016-01-22 DIAGNOSIS — R079 Chest pain, unspecified: Secondary | ICD-10-CM

## 2016-01-22 DIAGNOSIS — E876 Hypokalemia: Secondary | ICD-10-CM

## 2016-01-22 DIAGNOSIS — Z79899 Other long term (current) drug therapy: Secondary | ICD-10-CM | POA: Insufficient documentation

## 2016-01-22 LAB — POC URINE PREG, ED: PREG TEST UR: NEGATIVE

## 2016-01-22 MED ORDER — FAMOTIDINE IN NACL 20-0.9 MG/50ML-% IV SOLN
20.0000 mg | Freq: Once | INTRAVENOUS | Status: AC
Start: 2016-01-22 — End: 2016-01-23
  Administered 2016-01-22: 20 mg via INTRAVENOUS
  Filled 2016-01-22: qty 50

## 2016-01-22 MED ORDER — PROMETHAZINE HCL 12.5 MG PO TABS
12.5000 mg | ORAL_TABLET | Freq: Once | ORAL | Status: AC
  Administered 2016-01-22: 12.5 mg via ORAL
  Filled 2016-01-22: qty 1

## 2016-01-22 MED ORDER — PANTOPRAZOLE SODIUM 40 MG PO TBEC
40.0000 mg | DELAYED_RELEASE_TABLET | Freq: Once | ORAL | Status: AC
  Administered 2016-01-22: 40 mg via ORAL
  Filled 2016-01-22: qty 1

## 2016-01-22 NOTE — ED Triage Notes (Signed)
Pt reports she was driving and began having left and right sided chest pain. Pt reports this happened 2 hours ago.

## 2016-01-22 NOTE — ED Provider Notes (Signed)
[facility]-EMERGENCY DEPT Provider Note   CSN: [id number] Arrival date & time: 01/22/16  2155  By signing my name below, I, Christy SartoriusAnastasia Kolousek, attest that this documentation has been prepared under the direction and in the presence of  Ivery QualeHobson Islay Polanco, PA-C. Electronically Signed: Christy SartoriusAnastasia Kolousek, ED Scribe. 01/22/16. 10:58 PM.  History   Chief Complaint Chief Complaint  Patient presents with  . Chest Pain   The history is provided by the patient and medical records. No language interpreter was used.    HPI Comments:  Meagan Larey SeatLlano is a 25 y.o. female who presents to the Emergency Department complaining of chest pain that began 2 hours ago while driving. She describes that pain as constant, gradually worsening and sharp adding that it feels like a weight on her chest. Pt states she tried rolling a window down and when she got to her destination her pain was worse.  Pt last ate a burger from [employer].  Nothing makes her pain feel better or worse.   Pt denies use of recreational drugs, and smoking; she occasionally drinks alcohol.   She also denies injury and URI symptoms  She also complains of a dental abscess that goes a way for a little and comes back.  She has tried rinsing it out with saline with some relief.     Pt also complains of an episode of chest pain about a week ago. [facility] and when she got home the room was spinning and she couldn't walk straight.  She reports she "felt drunk" but hadn't been drinking.  The next morning her BP was 230/60.  She went to church and began feeling hot and sweating during the sermon.      Past Medical History:  Diagnosis Date  . Asthma   . Environmental allergies     There are no active problems to display for this patient.   Past Surgical History:  Procedure Laterality Date  . NO PAST SURGERIES      OB History    Gravida Para Term Preterm AB Living   1       1     SAB TAB Ectopic Multiple Live Births   1                 Home Medications    Prior to Admission medications   Medication Sig Start Date End Date Taking? Authorizing Provider  acetaminophen (TYLENOL) 500 MG tablet Take 500 mg by mouth every 6 (six) hours as needed.    Historical Provider, MD  norgestimate-ethinyl estradiol (ORTHO-CYCLEN,SPRINTEC,PREVIFEM) 0.25-35 MG-MCG tablet Take 1 tablet by mouth daily. 09/09/15   Jean RosenthalSusan P Lineberry, NP    Family History Family History  Problem Relation Age of Onset  . Hypertension Mother   . Diabetes Mother   . Hypertension Sister     Social History Social History  Substance Use Topics  . Smoking status: Never Smoker  . Smokeless tobacco: Never Used  . Alcohol use No     Allergies   Review of patient's allergies indicates no known allergies.   Review of Systems Review of Systems  HENT: Positive for dental problem.   Cardiovascular: Positive for chest pain.  Neurological: Positive for light-headedness.  All other systems reviewed and are negative.    Physical Exam Updated Vital Signs BP 129/83 (BP Location: Left Arm)   Pulse 88   Temp 98.1 F (36.7 C) (Oral)   Resp 16   Ht 5\' 4"  (1.626 m)  Wt (!) 356 lb (161.5 kg)   LMP 12/23/2015   SpO2 98%   BMI 61.11 kg/m   Physical Exam  Constitutional: She is oriented to person, place, and time. She appears well-developed and well-nourished. No distress.  HENT:  Head: Normocephalic and atraumatic.  Eyes: Conjunctivae are normal.  Neck:  No cervical lymphadenopathy.    Cardiovascular: Normal rate, regular rhythm and normal heart sounds.  Exam reveals no gallop and no friction rub.   No murmur heard. Pulses:      Dorsalis pedis pulses are 2+ on the right side, and 2+ on the left side.  Pulmonary/Chest: Effort normal.  Symmetrical rise and fall of the chest.  Lungs are clear.    Abdominal: Soft. She exhibits no mass.  No organomegaly.   Musculoskeletal:  No calf tenderness.  No pitting edema.    Neurological: She is alert and  oriented to person, place, and time.  Skin: Skin is warm and dry.  Psychiatric: She has a normal mood and affect.  Nursing note and vitals reviewed.    ED Treatments / Results   DIAGNOSTIC STUDIES:  Oxygen Saturation is 98% on RA, NML by my interpretation.    COORDINATION OF CARE:  10:58 PM Will order cardiac workup, check lipase, pain mediation and chest x-ray.  Discussed treatment plan with pt at bedside and pt agreed to plan.  Labs (all labs ordered are listed, but only abnormal results are displayed) Labs Reviewed - No data to display  EKG  EKG Interpretation None       Radiology No results found.  Procedures Procedures (including critical care time)  Medications Ordered in ED Medications - No data to display   Initial Impression / Assessment and Plan / ED Course  I have reviewed the triage vital signs and the nursing notes.  Pertinent labs & imaging results that were available during my care of the patient were reviewed by me and considered in my medical decision making (see chart for details).  Clinical Course    **I have reviewed nursing notes, vital signs, and all appropriate lab and imaging results for this patient.*  Final Clinical Impressions(s) / ED Diagnoses  Vital signs within normal limits. Urine pregnancy test is negative. The potassium was slightly low at 3.1, the albumin was slightly low at 3.4, otherwise the comprehensive metabolic panel was within normal limits. The troponin was negative for acute event. Lipase was normal at 25. Urine analysis was nonacute. Chest x-ray shows no acute pulmonary process.  Patient feels better after Pepcid Protonix and promethazine. Electrocardiogram shows no evidence of acute event. Suspect gastroesophageal reflux pain. Patient will be treated with Pepcid and Prilosec. Patient is to follow with her primary physician. I've also asked that she stay away from fatty foods until more detailed information about her  symptoms could be evaluated. Patient is to return to the emergency department immediately if any changes, problems, or concerns. Patient was amateur he in the room and the [place] without problem.    Final diagnoses:  Chest pain  Gastroesophageal reflux disease, esophagitis presence not specified  Hypokalemia  Pain due to dental caries    New Prescriptions New Prescriptions   No medications on file   **I personally performed the services described in this documentation, which was scribed in my presence. The recorded information has been reviewed and is accurate.Ivery Quale, PA-C 01/24/16 2129    Samuel Jester, DO 01/25/16 1958

## 2016-01-23 LAB — URINALYSIS, ROUTINE W REFLEX MICROSCOPIC
BILIRUBIN URINE: NEGATIVE
Glucose, UA: NEGATIVE mg/dL
Ketones, ur: NEGATIVE mg/dL
Leukocytes, UA: NEGATIVE
NITRITE: NEGATIVE
PROTEIN: NEGATIVE mg/dL
SPECIFIC GRAVITY, URINE: 1.02 (ref 1.005–1.030)
pH: 5.5 (ref 5.0–8.0)

## 2016-01-23 LAB — CBC WITH DIFFERENTIAL/PLATELET
BASOS ABS: 0 10*3/uL (ref 0.0–0.1)
BASOS PCT: 0 %
EOS ABS: 0.2 10*3/uL (ref 0.0–0.7)
Eosinophils Relative: 2 %
HEMATOCRIT: 36 % (ref 36.0–46.0)
HEMOGLOBIN: 12.2 g/dL (ref 12.0–15.0)
Lymphocytes Relative: 34 %
Lymphs Abs: 3.5 10*3/uL (ref 0.7–4.0)
MCH: 26.4 pg (ref 26.0–34.0)
MCHC: 33.9 g/dL (ref 30.0–36.0)
MCV: 77.9 fL — ABNORMAL LOW (ref 78.0–100.0)
Monocytes Absolute: 0.6 10*3/uL (ref 0.1–1.0)
Monocytes Relative: 5 %
NEUTROS ABS: 5.9 10*3/uL (ref 1.7–7.7)
NEUTROS PCT: 59 %
Platelets: 167 10*3/uL (ref 150–400)
RBC: 4.62 MIL/uL (ref 3.87–5.11)
RDW: 15.1 % (ref 11.5–15.5)
WBC: 10.2 10*3/uL (ref 4.0–10.5)

## 2016-01-23 LAB — LIPASE, BLOOD: LIPASE: 25 U/L (ref 11–51)

## 2016-01-23 LAB — COMPREHENSIVE METABOLIC PANEL
ALBUMIN: 3.4 g/dL — AB (ref 3.5–5.0)
ALT: 19 U/L (ref 14–54)
ANION GAP: 5 (ref 5–15)
AST: 19 U/L (ref 15–41)
Alkaline Phosphatase: 71 U/L (ref 38–126)
BILIRUBIN TOTAL: 0.7 mg/dL (ref 0.3–1.2)
BUN: 10 mg/dL (ref 6–20)
CALCIUM: 8.7 mg/dL — AB (ref 8.9–10.3)
CO2: 28 mmol/L (ref 22–32)
Chloride: 106 mmol/L (ref 101–111)
Creatinine, Ser: 0.81 mg/dL (ref 0.44–1.00)
Glucose, Bld: 126 mg/dL — ABNORMAL HIGH (ref 65–99)
POTASSIUM: 3.1 mmol/L — AB (ref 3.5–5.1)
Sodium: 139 mmol/L (ref 135–145)
TOTAL PROTEIN: 7.2 g/dL (ref 6.5–8.1)

## 2016-01-23 LAB — URINE MICROSCOPIC-ADD ON

## 2016-01-23 LAB — TROPONIN I

## 2016-01-23 MED ORDER — POTASSIUM CHLORIDE CRYS ER 20 MEQ PO TBCR
40.0000 meq | EXTENDED_RELEASE_TABLET | Freq: Once | ORAL | Status: AC
  Administered 2016-01-23: 40 meq via ORAL
  Filled 2016-01-23: qty 2

## 2016-01-23 MED ORDER — OMEPRAZOLE 20 MG PO CPDR: 20.0000 mg | DELAYED_RELEASE_CAPSULE | Freq: Two times a day (BID) | ORAL | 0 refills | Status: DC

## 2016-01-23 MED ORDER — FAMOTIDINE 20 MG PO TABS: 20.0000 mg | ORAL_TABLET | Freq: Two times a day (BID) | ORAL | 0 refills | Status: DC

## 2016-01-23 MED ORDER — AMOXICILLIN 500 MG PO CAPS: 500.0000 mg | ORAL_CAPSULE | Freq: Three times a day (TID) | ORAL | 0 refills | Status: DC

## 2016-01-23 NOTE — Discharge Instructions (Signed)
YOur heart enzymes and EKG are negative for acute event. Your potassium was low. Please increase foods with higher potassium levels. I suspect your have reflux disease. Please use pepcid 20mg  2 times daily and prilosec daily. Avoid fatty foods and eating late at night.

## 2016-01-23 NOTE — ED Notes (Signed)
Pt c/o pain to her IV site, IV removed per pt request

## 2016-03-24 ENCOUNTER — Encounter: Payer: Self-pay | Admitting: Women's Health

## 2016-03-24 ENCOUNTER — Encounter (HOSPITAL_COMMUNITY): Payer: Self-pay | Admitting: Emergency Medicine

## 2016-03-24 ENCOUNTER — Emergency Department (HOSPITAL_COMMUNITY)
Admission: EM | Admit: 2016-03-24 | Discharge: 2016-03-25 | Disposition: A | Discharge status: Home / Self Care | Payer: No Typology Code available for payment source | Attending: Emergency Medicine | Admitting: Emergency Medicine

## 2016-03-24 DIAGNOSIS — Y92410 Unspecified street and highway as the place of occurrence of the external cause: Secondary | ICD-10-CM | POA: Diagnosis not present

## 2016-03-24 DIAGNOSIS — R10817 Generalized abdominal tenderness: Secondary | ICD-10-CM | POA: Insufficient documentation

## 2016-03-24 DIAGNOSIS — J45909 Unspecified asthma, uncomplicated: Secondary | ICD-10-CM | POA: Insufficient documentation

## 2016-03-24 DIAGNOSIS — Y939 Activity, unspecified: Secondary | ICD-10-CM | POA: Insufficient documentation

## 2016-03-24 DIAGNOSIS — Z79899 Other long term (current) drug therapy: Secondary | ICD-10-CM | POA: Insufficient documentation

## 2016-03-24 DIAGNOSIS — R51 Headache: Secondary | ICD-10-CM | POA: Diagnosis not present

## 2016-03-24 DIAGNOSIS — S199XXA Unspecified injury of neck, initial encounter: Secondary | ICD-10-CM | POA: Diagnosis present

## 2016-03-24 DIAGNOSIS — S161XXA Strain of muscle, fascia and tendon at neck level, initial encounter: Secondary | ICD-10-CM | POA: Insufficient documentation

## 2016-03-24 DIAGNOSIS — Y999 Unspecified external cause status: Secondary | ICD-10-CM | POA: Insufficient documentation
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
## 2016-03-25 ENCOUNTER — Encounter (HOSPITAL_COMMUNITY): Payer: Self-pay | Admitting: Radiology

## 2016-03-25 ENCOUNTER — Emergency Department (HOSPITAL_COMMUNITY): Payer: No Typology Code available for payment source

## 2016-03-25 LAB — CBC WITH DIFFERENTIAL/PLATELET
Basophils Absolute: 0 10*3/uL (ref 0.0–0.1)
Basophils Relative: 0 %
EOS ABS: 0.3 10*3/uL (ref 0.0–0.7)
EOS PCT: 3 %
HCT: 35.6 % — ABNORMAL LOW (ref 36.0–46.0)
Hemoglobin: 12 g/dL (ref 12.0–15.0)
LYMPHS ABS: 3.7 10*3/uL (ref 0.7–4.0)
LYMPHS PCT: 38 %
MCH: 26 pg (ref 26.0–34.0)
MCHC: 33.7 g/dL (ref 30.0–36.0)
MCV: 77.2 fL — AB (ref 78.0–100.0)
MONO ABS: 0.5 10*3/uL (ref 0.1–1.0)
Monocytes Relative: 5 %
Neutro Abs: 5.3 10*3/uL (ref 1.7–7.7)
Neutrophils Relative %: 54 %
PLATELETS: 196 10*3/uL (ref 150–400)
RBC: 4.61 MIL/uL (ref 3.87–5.11)
RDW: 15.3 % (ref 11.5–15.5)
WBC: 9.8 10*3/uL (ref 4.0–10.5)

## 2016-03-25 LAB — BASIC METABOLIC PANEL
Anion gap: 9 (ref 5–15)
BUN: 10 mg/dL (ref 6–20)
CALCIUM: 9.2 mg/dL (ref 8.9–10.3)
CO2: 24 mmol/L (ref 22–32)
CREATININE: 0.78 mg/dL (ref 0.44–1.00)
Chloride: 105 mmol/L (ref 101–111)
GFR calc Af Amer: >60 mL/min (ref >60)
GLUCOSE: 102 mg/dL — AB (ref 65–99)
Potassium: 3.8 mmol/L (ref 3.5–5.1)
Sodium: 138 mmol/L (ref 135–145)

## 2016-03-25 LAB — I-STAT BETA HCG BLOOD, ED (MC, WL, AP ONLY)

## 2016-03-25 MED ORDER — HYDROCODONE-ACETAMINOPHEN 5-325 MG PO TABS: 1.0000 | ORAL_TABLET | ORAL | 0 refills | Status: DC | PRN

## 2016-03-25 MED ORDER — MORPHINE SULFATE (PF) 4 MG/ML IV SOLN
6.0000 mg | Freq: Once | INTRAVENOUS | Status: AC
  Administered 2016-03-25: 6 mg via INTRAVENOUS
  Filled 2016-03-25: qty 2

## 2016-03-25 MED ORDER — IOPAMIDOL (ISOVUE-300) INJECTION 61%
INTRAVENOUS | Status: AC
  Administered 2016-03-25: 100 mL
  Filled 2016-03-25: qty 100

## 2016-03-25 MED ORDER — DIPHENHYDRAMINE HCL 50 MG/ML IJ SOLN
12.5000 mg | Freq: Once | INTRAMUSCULAR | Status: AC
  Administered 2016-03-25: 12.5 mg via INTRAVENOUS
  Filled 2016-03-25: qty 1

## 2016-03-25 MED ORDER — CYCLOBENZAPRINE HCL 10 MG PO TABS
10.0000 mg | ORAL_TABLET | Freq: Two times a day (BID) | ORAL | 0 refills | Status: DC | PRN
Start: 2016-03-25 — End: 2016-03-30

## 2016-03-25 MED ORDER — NAPROXEN 500 MG PO TABS: 500.0000 mg | ORAL_TABLET | Freq: Two times a day (BID) | ORAL | 0 refills | Status: DC

## 2016-03-25 NOTE — ED Notes (Signed)
Pt no longer in pyxis, Charlett BlakeEmilie Olsen RN witnessed waste of 2mg  with this RN

## 2016-03-25 NOTE — ED Notes (Signed)
Patient transported to CT 

## 2016-03-25 NOTE — ED Notes (Signed)
Pt called out about itching on R arm, pt noted to have hives, rxn from Morphine. EDP aware and verbal order given for 12.5 Benadryl. EDP also at bedside

## 2016-03-30 ENCOUNTER — Emergency Department (HOSPITAL_COMMUNITY): Payer: Self-pay

## 2016-03-30 ENCOUNTER — Emergency Department (HOSPITAL_COMMUNITY)
Admission: EM | Admit: 2016-03-30 | Discharge: 2016-03-30 | Disposition: A | Discharge status: Home / Self Care | Payer: Self-pay | Attending: Emergency Medicine | Admitting: Emergency Medicine

## 2016-03-30 ENCOUNTER — Encounter (HOSPITAL_COMMUNITY): Payer: Self-pay | Admitting: Emergency Medicine

## 2016-03-30 DIAGNOSIS — J45909 Unspecified asthma, uncomplicated: Secondary | ICD-10-CM | POA: Insufficient documentation

## 2016-03-30 DIAGNOSIS — K5900 Constipation, unspecified: Secondary | ICD-10-CM | POA: Insufficient documentation

## 2016-03-30 DIAGNOSIS — B9689 Other specified bacterial agents as the cause of diseases classified elsewhere: Secondary | ICD-10-CM

## 2016-03-30 DIAGNOSIS — N760 Acute vaginitis: Secondary | ICD-10-CM | POA: Insufficient documentation

## 2016-03-30 DIAGNOSIS — R1084 Generalized abdominal pain: Secondary | ICD-10-CM

## 2016-03-30 LAB — CBC WITH DIFFERENTIAL/PLATELET
BASOS PCT: 0 %
Basophils Absolute: 0 10*3/uL (ref 0.0–0.1)
EOS ABS: 0.3 10*3/uL (ref 0.0–0.7)
EOS PCT: 3 %
HCT: 36.8 % (ref 36.0–46.0)
HEMOGLOBIN: 12.1 g/dL (ref 12.0–15.0)
Lymphocytes Relative: 33 %
Lymphs Abs: 2.8 10*3/uL (ref 0.7–4.0)
MCH: 25.9 pg — ABNORMAL LOW (ref 26.0–34.0)
MCHC: 32.9 g/dL (ref 30.0–36.0)
MCV: 78.6 fL (ref 78.0–100.0)
MONOS PCT: 6 %
Monocytes Absolute: 0.5 10*3/uL (ref 0.1–1.0)
NEUTROS PCT: 58 %
Neutro Abs: 4.9 10*3/uL (ref 1.7–7.7)
PLATELETS: 188 10*3/uL (ref 150–400)
RBC: 4.68 MIL/uL (ref 3.87–5.11)
RDW: 15.2 % (ref 11.5–15.5)
WBC: 8.5 10*3/uL (ref 4.0–10.5)

## 2016-03-30 LAB — COMPREHENSIVE METABOLIC PANEL
ALK PHOS: 63 U/L (ref 38–126)
ALT: 20 U/L (ref 14–54)
AST: 20 U/L (ref 15–41)
Albumin: 3.4 g/dL — ABNORMAL LOW (ref 3.5–5.0)
Anion gap: 6 (ref 5–15)
BUN: 10 mg/dL (ref 6–20)
CALCIUM: 8.9 mg/dL (ref 8.9–10.3)
CO2: 27 mmol/L (ref 22–32)
CREATININE: 0.8 mg/dL (ref 0.44–1.00)
Chloride: 103 mmol/L (ref 101–111)
GFR calc Af Amer: >60 mL/min (ref >60)
Glucose, Bld: 110 mg/dL — ABNORMAL HIGH (ref 65–99)
Potassium: 3.7 mmol/L (ref 3.5–5.1)
Sodium: 136 mmol/L (ref 135–145)
Total Bilirubin: 0.4 mg/dL (ref 0.3–1.2)
Total Protein: 7.3 g/dL (ref 6.5–8.1)

## 2016-03-30 LAB — URINALYSIS, ROUTINE W REFLEX MICROSCOPIC
BILIRUBIN URINE: NEGATIVE
Glucose, UA: NEGATIVE mg/dL
Hgb urine dipstick: NEGATIVE
KETONES UR: NEGATIVE mg/dL
Leukocytes, UA: NEGATIVE
Nitrite: NEGATIVE
PROTEIN: NEGATIVE mg/dL
Specific Gravity, Urine: 1.017 (ref 1.005–1.030)
pH: 6 (ref 5.0–8.0)

## 2016-03-30 LAB — WET PREP, GENITAL
Sperm: NONE SEEN
Trich, Wet Prep: NONE SEEN
YEAST WET PREP: NONE SEEN

## 2016-03-30 LAB — LIPASE, BLOOD: LIPASE: 25 U/L (ref 11–51)

## 2016-03-30 MED ORDER — FENTANYL CITRATE (PF) 100 MCG/2ML IJ SOLN
100.0000 ug | Freq: Once | INTRAMUSCULAR | Status: DC
Start: 2016-03-30 — End: 2016-03-30
  Filled 2016-03-30: qty 2

## 2016-03-30 MED ORDER — DOCUSATE SODIUM 100 MG PO CAPS: 100.0000 mg | ORAL_CAPSULE | Freq: Two times a day (BID) | ORAL | 0 refills | Status: DC

## 2016-03-30 MED ORDER — METRONIDAZOLE 500 MG PO TABS: 500.0000 mg | ORAL_TABLET | Freq: Two times a day (BID) | ORAL | 0 refills | Status: DC

## 2016-03-30 MED ORDER — FENTANYL CITRATE (PF) 100 MCG/2ML IJ SOLN
50.0000 ug | Freq: Once | INTRAMUSCULAR | Status: AC
  Administered 2016-03-30: 50 ug via INTRAVENOUS
  Filled 2016-03-30: qty 2

## 2016-03-30 MED ORDER — ONDANSETRON HCL 4 MG/2ML IJ SOLN
4.0000 mg | Freq: Once | INTRAMUSCULAR | Status: AC
  Administered 2016-03-30: 4 mg via INTRAVENOUS
  Filled 2016-03-30: qty 2
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
## 2016-03-30 NOTE — ED Notes (Signed)
POC urine pregnancy negative. Not crossing over the computers.

## 2016-03-30 NOTE — Discharge Instructions (Signed)

## 2016-03-30 NOTE — ED Notes (Signed)
Blood drawn by lab tech.
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
## 2016-03-31 LAB — GC/CHLAMYDIA PROBE AMP (~~LOC~~) NOT AT ARMC
Chlamydia: NEGATIVE
Neisseria Gonorrhea: NEGATIVE

## 2016-04-06 ENCOUNTER — Encounter: Payer: Self-pay | Admitting: Advanced Practice Midwife

## 2017-08-02 ENCOUNTER — Encounter: Payer: Self-pay | Admitting: Adult Health

## 2017-10-15 ENCOUNTER — Other Ambulatory Visit (HOSPITAL_COMMUNITY): Payer: Self-pay | Admitting: Nurse Practitioner

## 2017-10-15 DIAGNOSIS — N921 Excessive and frequent menstruation with irregular cycle: Secondary | ICD-10-CM

## 2017-10-22 ENCOUNTER — Encounter (HOSPITAL_COMMUNITY): Payer: Self-pay

## 2017-10-22 ENCOUNTER — Ambulatory Visit (HOSPITAL_COMMUNITY)
Admission: RE | Admit: 2017-10-22 | Discharge: 2017-10-22 | Disposition: A | Discharge status: Home / Self Care | Payer: Self-pay | Source: Ambulatory Visit | Attending: Nurse Practitioner | Admitting: Nurse Practitioner

## 2017-11-01 ENCOUNTER — Other Ambulatory Visit: Payer: Self-pay

## 2017-11-01 ENCOUNTER — Ambulatory Visit (HOSPITAL_COMMUNITY)
Admission: RE | Admit: 2017-11-01 | Discharge: 2017-11-01 | Disposition: A | Discharge status: Home / Self Care | Payer: Self-pay | Source: Ambulatory Visit | Attending: Nurse Practitioner | Admitting: Nurse Practitioner

## 2017-11-01 ENCOUNTER — Emergency Department (HOSPITAL_COMMUNITY)
Admission: EM | Admit: 2017-11-01 | Discharge: 2017-11-01 | Disposition: A | Discharge status: Home / Self Care | Payer: Self-pay | Attending: Emergency Medicine | Admitting: Emergency Medicine

## 2017-11-01 ENCOUNTER — Emergency Department (HOSPITAL_COMMUNITY): Payer: Self-pay

## 2017-11-01 ENCOUNTER — Encounter (HOSPITAL_COMMUNITY): Payer: Self-pay

## 2017-11-01 DIAGNOSIS — N938 Other specified abnormal uterine and vaginal bleeding: Secondary | ICD-10-CM | POA: Insufficient documentation

## 2017-11-01 DIAGNOSIS — Z79899 Other long term (current) drug therapy: Secondary | ICD-10-CM | POA: Insufficient documentation

## 2017-11-01 DIAGNOSIS — N921 Excessive and frequent menstruation with irregular cycle: Secondary | ICD-10-CM

## 2017-11-01 DIAGNOSIS — N920 Excessive and frequent menstruation with regular cycle: Secondary | ICD-10-CM | POA: Insufficient documentation

## 2017-11-01 DIAGNOSIS — J45909 Unspecified asthma, uncomplicated: Secondary | ICD-10-CM | POA: Insufficient documentation

## 2017-11-01 LAB — CBC WITH DIFFERENTIAL/PLATELET
Basophils Absolute: 0 10*3/uL (ref 0.0–0.1)
Basophils Relative: 0 %
Eosinophils Absolute: 0.3 10*3/uL (ref 0.0–0.7)
Eosinophils Relative: 3 %
HEMATOCRIT: 36.5 % (ref 36.0–46.0)
Hemoglobin: 11.8 g/dL — ABNORMAL LOW (ref 12.0–15.0)
LYMPHS PCT: 41 %
Lymphs Abs: 3.6 10*3/uL (ref 0.7–4.0)
MCH: 23.5 pg — AB (ref 26.0–34.0)
MCHC: 32.3 g/dL (ref 30.0–36.0)
MCV: 72.7 fL — AB (ref 78.0–100.0)
MONO ABS: 0.4 10*3/uL (ref 0.1–1.0)
MONOS PCT: 4 %
NEUTROS ABS: 4.6 10*3/uL (ref 1.7–7.7)
Neutrophils Relative %: 52 %
Platelets: 226 10*3/uL (ref 150–400)
RBC: 5.02 MIL/uL (ref 3.87–5.11)
RDW: 18.8 % — AB (ref 11.5–15.5)
WBC: 8.9 10*3/uL (ref 4.0–10.5)

## 2017-11-01 LAB — COMPREHENSIVE METABOLIC PANEL
ALBUMIN: 3.4 g/dL — AB (ref 3.5–5.0)
ALK PHOS: 71 U/L (ref 38–126)
ALT: 20 U/L (ref 0–44)
ANION GAP: 6 (ref 5–15)
AST: 20 U/L (ref 15–41)
BUN: 10 mg/dL (ref 6–20)
CALCIUM: 8.9 mg/dL (ref 8.9–10.3)
CHLORIDE: 103 mmol/L (ref 98–111)
CO2: 29 mmol/L (ref 22–32)
Creatinine, Ser: 0.76 mg/dL (ref 0.44–1.00)
GFR calc non Af Amer: >60 mL/min (ref >60)
GLUCOSE: 96 mg/dL (ref 70–99)
Potassium: 3.7 mmol/L (ref 3.5–5.1)
SODIUM: 138 mmol/L (ref 135–145)
Total Bilirubin: 0.4 mg/dL (ref 0.3–1.2)
Total Protein: 7.3 g/dL (ref 6.5–8.1)

## 2017-11-01 LAB — I-STAT BETA HCG BLOOD, ED (MC, WL, AP ONLY): I-stat hCG, quantitative: <5 m[IU]/mL (ref <5)

## 2017-11-01 MED ORDER — SODIUM CHLORIDE 0.9 % IV BOLUS
500.0000 mL | Freq: Once | INTRAVENOUS | Status: AC
  Administered 2017-11-01: 500 mL via INTRAVENOUS

## 2017-11-01 MED ORDER — FENTANYL CITRATE (PF) 100 MCG/2ML IJ SOLN
50.0000 ug | Freq: Once | INTRAMUSCULAR | Status: AC
  Administered 2017-11-01: 50 ug via INTRAVENOUS
  Filled 2017-11-01: qty 2

## 2017-11-01 MED ORDER — IOPAMIDOL (ISOVUE-300) INJECTION 61%
100.0000 mL | Freq: Once | INTRAVENOUS | Status: AC | PRN
  Administered 2017-11-01: 100 mL via INTRAVENOUS

## 2017-11-01 MED ORDER — ONDANSETRON HCL 4 MG/2ML IJ SOLN
4.0000 mg | Freq: Once | INTRAMUSCULAR | Status: AC
  Administered 2017-11-01: 4 mg via INTRAVENOUS
  Filled 2017-11-01: qty 2

## 2017-11-01 MED ORDER — ONDANSETRON HCL 8 MG PO TABS: 8.0000 mg | ORAL_TABLET | ORAL | 0 refills | Status: DC | PRN

## 2017-11-01 NOTE — ED Provider Notes (Signed)
[facility] EMERGENCY DEPARTMENT Provider Note   CSN: [id number] Arrival date & time: 11/01/17  1213     History   Chief Complaint Chief Complaint  Patient presents with  . Abdominal Pain    HPI Meagan Oneill is a 27 y.o. female.  Cramping left lower quadrant pain since Friday.  Patient has had had vaginal bleeding intermittently for 1 year.  She was recently put on hormone replacement therapy.  An ultrasound today revealed a an 8.6 mm endometrial stripe, normal uterus and right ovary, nonvisualization of the left left ovary.  She is not sexually active.  No fever, sweats, chills, dysuria, hematuria, vomiting, diarrhea.  Severity of pain is moderate.  Palpation makes pain worse.  Patient has had a recent pelvic exam and Pap smear.     Past Medical History:  Diagnosis Date  . Asthma   . Environmental allergies     There are no active problems to display for this patient.   Past Surgical History:  Procedure Laterality Date  . NO PAST SURGERIES       OB History    Gravida  1   Para      Term      Preterm      AB  1   Living        SAB  1   TAB      Ectopic      Multiple      Live Births               Home Medications    Prior to Admission medications   Medication Sig Start Date End Date Taking? Authorizing Provider  ondansetron (ZOFRAN) 8 MG tablet Take 1 tablet (8 mg total) by mouth every 4 (four) hours as needed. 11/01/17   Donnetta Hutching, MD    Family History Family History  Problem Relation Age of Onset  . Hypertension Mother   . Diabetes Mother   . Hypertension Sister     Social History Social History   Tobacco Use  . Smoking status: Never Smoker  . Smokeless tobacco: Never Used  Substance Use Topics  . Alcohol use: No  . Drug use: No     Allergies   Morphine and related   Review of Systems Review of Systems  All other systems reviewed and are negative.    Physical Exam Updated Vital Signs BP 105/61   Pulse 90    Temp 97.9 F (36.6 C) (Oral)   Resp 18   Ht 5\' 3"  (1.6 m)   Wt (!) 169.6 kg (374 lb)   SpO2 98%   BMI 66.25 kg/m   Physical Exam  Constitutional: She is oriented to person, place, and time.  Obese, nad  HENT:  Head: Normocephalic and atraumatic.  Eyes: Conjunctivae are normal.  Neck: Neck supple.  Cardiovascular: Normal rate and regular rhythm.  Pulmonary/Chest: Effort normal and breath sounds normal.  Abdominal: Soft. Bowel sounds are normal.  Tender LLQ  Musculoskeletal: Normal range of motion.  Neurological: She is alert and oriented to person, place, and time.  Skin: Skin is warm and dry.  Psychiatric: She has a normal mood and affect. Her behavior is normal.  Nursing note and vitals reviewed.    ED Treatments / Results  Labs (all labs ordered are listed, but only abnormal results are displayed) Labs Reviewed  COMPREHENSIVE METABOLIC PANEL - Abnormal; Notable for the following components:      Result Value   Albumin 3.4 (*)  All other components within normal limits  CBC WITH DIFFERENTIAL/PLATELET - Abnormal; Notable for the following components:   Hemoglobin 11.8 (*)    MCV 72.7 (*)    MCH 23.5 (*)    RDW 18.8 (*)    All other components within normal limits  CBC WITH DIFFERENTIAL/PLATELET  I-STAT BETA HCG BLOOD, ED ([facility], [facility], [facility] ONLY)    EKG None  Radiology Ct Abdomen Pelvis W Contrast  Result Date: 11/01/2017 CLINICAL DATA:  Abdominal pain and cramping for 6 days. Nearly constant vaginal bleeding. EXAM: CT ABDOMEN AND PELVIS WITH CONTRAST TECHNIQUE: Multidetector CT imaging of the abdomen and pelvis was performed using the standard protocol following bolus administration of intravenous contrast. CONTRAST:  100mL ISOVUE-300 IOPAMIDOL (ISOVUE-300) INJECTION 61% COMPARISON:  Abdominal ultrasound November 01, 2017 FINDINGS: Large body habitus results in overall noisy image quality. LOWER CHEST: Lung bases are clear. Included heart size is normal. No pericardial  effusion. HEPATOBILIARY: Liver and gallbladder are normal. PANCREAS: Normal. SPLEEN: Normal. ADRENALS/URINARY TRACT: Kidneys are orthotopic, demonstrating symmetric enhancement. No nephrolithiasis, hydronephrosis or solid renal masses. 2.5 cm LEFT lower pole homogeneously hypodense cyst with thin rim calcifications (Bosniak 2). The unopacified ureters are normal in course and caliber urinary bladder is partially distended and unremarkable. Normal adrenal glands. STOMACH/BOWEL: The stomach, small and large bowel are normal in course and caliber without inflammatory changes. Normal appendix. VASCULAR/LYMPHATIC: Aortoiliac vessels are normal in course and caliber. No lymphadenopathy by CT size criteria. Small presumed reactive inguinal lymph nodes. REPRODUCTIVE: Mild probable uterine prolapse, otherwise negative. OTHER: No intraperitoneal free fluid or free air. MUSCULOSKELETAL: Nonacute. Small to moderate fat containing umbilical hernia. Anterior abdominal wall edema without focal fluid collection. IMPRESSION: 1. No acute intra-abdominal or pelvic process. Electronically Signed   By: Awilda Metroourtnay  Bloomer M.D.   On: 11/01/2017 18:23   [place] Pelvic Complete With Transvaginal  Result Date: 11/01/2017 CLINICAL DATA:  Initial evaluation for menorrhagia with irregular cycle. EXAM: TRANSABDOMINAL AND TRANSVAGINAL ULTRASOUND OF PELVIS TECHNIQUE: Both transabdominal and transvaginal ultrasound examinations of the pelvis were performed. Transabdominal technique was performed for global imaging of the pelvis including uterus, ovaries, adnexal regions, and pelvic cul-de-sac. It was necessary to proceed with endovaginal exam following the transabdominal exam to visualize the uterus, endometrium, and ovaries. COMPARISON:  None FINDINGS: Uterus Measurements: 7.5 x 3.5 x 4.0 cm. No fibroids or other mass visualized. Endometrium Thickness: 8.6 mm. Endometrial stripe demonstrates a heterogeneous echotexture without discrete lesion. Right  ovary Measurements: 2.4 x 1.7 x 2.3 cm. Normal appearance/no adnexal mass. Left ovary Not visualized.  No adnexal mass. Other findings Trace free physiologic fluid within the pelvis. IMPRESSION: 1. Endometrial stripe measures 8.6 mm in thickness. If bleeding remains unresponsive to hormonal or medical therapy, sonohysterogram should be considered for focal lesion work-up. (Ref: Radiological Reasoning: Algorithmic Workup of Abnormal Vaginal Bleeding with Endovaginal Sonography and Sonohysterography. AJR 2008; 161:W96-04; 191:S68-73). 2. Normal appearance of the uterus and right ovary. 3. Nonvisualization of the left ovary.  No pelvic or adnexal mass. Electronically Signed   By: Rise MuBenjamin  McClintock M.D.   On: 11/01/2017 13:53    Procedures Procedures (including critical care time)  Medications Ordered in ED Medications  sodium chloride 0.9 % bolus 500 mL (0 mLs Intravenous Stopped 11/01/17 1835)  ondansetron (ZOFRAN) injection 4 mg (4 mg Intravenous Given 11/01/17 1653)  fentaNYL (SUBLIMAZE) injection 50 mcg (50 mcg Intravenous Given 11/01/17 1653)  iopamidol (ISOVUE-300) 61 % injection 100 mL (100 mLs Intravenous Contrast Given 11/01/17 1741)  Initial Impression / Assessment and Plan / ED Course  I have reviewed the triage vital signs and the nursing notes.  Pertinent labs & imaging results that were available during my care of the patient were reviewed by me and considered in my medical decision making (see chart for details).     Patient presents with left lower quadrant pain for short period of time and vaginal bleeding for 1 year.  She allegedly is on hormone replacement therapy but she does not know the name of the medication. No acute abdomen on physical exam.  Hemoglobin stable.  CT scan shows no acute abnormalities.  Ultrasound from today reviewed.  Patient will follow-up with OB/GYN.  Discharge medications Zofran 8 mg  Final Clinical Impressions(s) / ED Diagnoses   Final diagnoses:    Dysfunctional or functional uterine hemorrhage    ED Discharge Orders        Ordered    ondansetron (ZOFRAN) 8 MG tablet  Every 4 hours PRN     11/01/17 2022       Donnetta Hutching, MD 11/01/17 2106

## 2017-11-01 NOTE — ED Notes (Signed)
PIV unable to be obtained at this time. B. Patraw, RN has attempted 2 IV ultrasounds. Warrick Parisian. Minter, Charge RN aware and will attempt PIV.

## 2017-11-01 NOTE — Discharge Instructions (Addendum)
CT scan showed no life-threatening condition.  You will need to follow-up with OB/GYN.  Phone number given.  Prescription for nausea medicine.

## 2017-11-01 NOTE — ED Triage Notes (Signed)
Pt is having abdominal pain and cramping since Friday. States it is LLQ. Has been on her period since last June. Stated it has been constant except for 3 weeks in January. Had an ultrasound today here and felt the need to come in today.

## 2018-10-21 ENCOUNTER — Encounter (HOSPITAL_COMMUNITY): Payer: Self-pay | Admitting: Emergency Medicine

## 2018-10-21 ENCOUNTER — Emergency Department (HOSPITAL_COMMUNITY)
Admission: EM | Admit: 2018-10-21 | Discharge: 2018-10-21 | Disposition: A | Discharge status: Home / Self Care | Payer: Self-pay | Attending: Emergency Medicine | Admitting: Emergency Medicine

## 2018-10-21 ENCOUNTER — Other Ambulatory Visit: Payer: Self-pay

## 2018-10-21 DIAGNOSIS — Z79899 Other long term (current) drug therapy: Secondary | ICD-10-CM | POA: Insufficient documentation

## 2018-10-21 DIAGNOSIS — N939 Abnormal uterine and vaginal bleeding, unspecified: Secondary | ICD-10-CM | POA: Insufficient documentation

## 2018-10-21 DIAGNOSIS — R102 Pelvic and perineal pain: Secondary | ICD-10-CM | POA: Insufficient documentation

## 2018-10-21 DIAGNOSIS — J45909 Unspecified asthma, uncomplicated: Secondary | ICD-10-CM | POA: Insufficient documentation

## 2018-10-21 LAB — TYPE AND SCREEN
ABO/RH(D): B NEG
Antibody Screen: NEGATIVE

## 2018-10-21 LAB — CBC WITH DIFFERENTIAL/PLATELET
Abs Immature Granulocytes: 0.02 10*3/uL (ref 0.00–0.07)
Basophils Absolute: 0 10*3/uL (ref 0.0–0.1)
Basophils Relative: 0 %
Eosinophils Absolute: 0.3 10*3/uL (ref 0.0–0.5)
Eosinophils Relative: 3 %
HCT: 33.6 % — ABNORMAL LOW (ref 36.0–46.0)
Hemoglobin: 10.4 g/dL — ABNORMAL LOW (ref 12.0–15.0)
Immature Granulocytes: 0 %
Lymphocytes Relative: 32 %
Lymphs Abs: 2.9 10*3/uL (ref 0.7–4.0)
MCH: 21.8 pg — ABNORMAL LOW (ref 26.0–34.0)
MCHC: 31 g/dL (ref 30.0–36.0)
MCV: 70.6 fL — ABNORMAL LOW (ref 80.0–100.0)
Monocytes Absolute: 0.4 10*3/uL (ref 0.1–1.0)
Monocytes Relative: 4 %
Neutro Abs: 5.4 10*3/uL (ref 1.7–7.7)
Neutrophils Relative %: 61 %
Platelets: 229 10*3/uL (ref 150–400)
RBC: 4.76 MIL/uL (ref 3.87–5.11)
RDW: 20.4 % — ABNORMAL HIGH (ref 11.5–15.5)
WBC: 9 10*3/uL (ref 4.0–10.5)
nRBC: 0 % (ref 0.0–0.2)

## 2018-10-21 LAB — COMPREHENSIVE METABOLIC PANEL
ALT: 13 U/L (ref 0–44)
AST: 16 U/L (ref 15–41)
Albumin: 3.2 g/dL — ABNORMAL LOW (ref 3.5–5.0)
Alkaline Phosphatase: 66 U/L (ref 38–126)
Anion gap: 5 (ref 5–15)
BUN: 10 mg/dL (ref 6–20)
CO2: 27 mmol/L (ref 22–32)
Calcium: 8.6 mg/dL — ABNORMAL LOW (ref 8.9–10.3)
Chloride: 105 mmol/L (ref 98–111)
Creatinine, Ser: 0.65 mg/dL (ref 0.44–1.00)
GFR calc Af Amer: >60 mL/min (ref >60)
GFR calc non Af Amer: >60 mL/min (ref >60)
Glucose, Bld: 125 mg/dL — ABNORMAL HIGH (ref 70–99)
Potassium: 3.3 mmol/L — ABNORMAL LOW (ref 3.5–5.1)
Sodium: 137 mmol/L (ref 135–145)
Total Bilirubin: 0.6 mg/dL (ref 0.3–1.2)
Total Protein: 7.3 g/dL (ref 6.5–8.1)

## 2018-10-21 LAB — WET PREP, GENITAL
Clue Cells Wet Prep HPF POC: NONE SEEN
Sperm: NONE SEEN
Trich, Wet Prep: NONE SEEN
Yeast Wet Prep HPF POC: NONE SEEN

## 2018-10-21 LAB — HCG, QUANTITATIVE, PREGNANCY: hCG, Beta Chain, Quant, S: <1 m[IU]/mL (ref <5)

## 2018-10-21 MED ORDER — KETOROLAC TROMETHAMINE 30 MG/ML IJ SOLN
30.0000 mg | Freq: Once | INTRAMUSCULAR | Status: AC
  Administered 2018-10-21: 30 mg via INTRAMUSCULAR
  Filled 2018-10-21: qty 1

## 2018-10-21 MED ORDER — FERROUS SULFATE 325 (65 FE) MG PO TABS: 325.0000 mg | ORAL_TABLET | Freq: Every day | ORAL | 0 refills | Status: DC

## 2018-10-21 MED ORDER — IBUPROFEN 600 MG PO TABS: 600.0000 mg | ORAL_TABLET | Freq: Four times a day (QID) | ORAL | 0 refills | Status: DC | PRN

## 2018-10-21 NOTE — ED Triage Notes (Signed)
Pt states that she is going through a box of pads a day. She has been doing that for a week and she is having lower abd pain denies urinary symptoms or discharge.

## 2018-10-21 NOTE — ED Provider Notes (Signed)
[facility] EMERGENCY DEPARTMENT Provider Note   CSN: [id number] Arrival date & time: 10/21/18  1456    History   Chief Complaint Chief Complaint  Patient presents with  . Vaginal Bleeding    HPI Meagan Oneill is a 28 y.o. female.     HPI   Pt is a 28 y/o female with a h/o asthma, environmental allergies, who presents to the ED today for eval of vaginal bleeding. States that she has had vaginal bleeding since February but for the last week the bleeding has increased. States she is needing to change a pad every hour during this time period. She is passing clots as well.   She reports pain to the suprapubic are and left pelvic area for the same time period. Pain is constant. Pain rated 10/10. Pain improved by ibuprofen. No associated NVD, dysuria, frequency, or urgency. No bloody stools. Denies fevers or abnormal vaginal discharge. Denies lightheadedness, dizziness, or syncope.  Denies concern for STD. States she is not sexually active.   States she had been on birth control in the past for this but she is not currently taking it.   Past Medical History:  Diagnosis Date  . Asthma   . Environmental allergies     There are no active problems to display for this patient.   Past Surgical History:  Procedure Laterality Date  . NO PAST SURGERIES       OB History    Gravida  1   Para      Term      Preterm      AB  1   Living        SAB  1   TAB      Ectopic      Multiple      Live Births               Home Medications    Prior to Admission medications   Medication Sig Start Date End Date Taking? Authorizing Provider  ferrous sulfate 325 (65 FE) MG tablet Take 1 tablet (325 mg total) by mouth daily for 14 days. 10/21/18 11/04/18  [place] Hanlon S, PA-C  ibuprofen (ADVIL) 600 MG tablet Take 1 tablet (600 mg total) by mouth every 6 (six) hours as needed. 10/21/18   Danner Paulding S, PA-C    Family History Family History  Problem Relation Age of  Onset  . Hypertension Mother   . Diabetes Mother   . Hypertension Sister     Social History Social History   Tobacco Use  . Smoking status: Never Smoker  . Smokeless tobacco: Never Used  Substance Use Topics  . Alcohol use: No  . Drug use: No     Allergies   Morphine and related   Review of Systems Review of Systems  Constitutional: Negative for fever.  HENT: Negative for ear pain and sore throat.   Eyes: Negative for visual disturbance.  Respiratory: Negative for cough and shortness of breath.   Cardiovascular: Negative for chest pain.  Gastrointestinal: Positive for abdominal pain. Negative for constipation, diarrhea, nausea and vomiting.  Genitourinary: Positive for pelvic pain and vaginal bleeding. Negative for dysuria, hematuria and vaginal discharge.  Musculoskeletal: Negative for back pain.  Skin: Negative for color change, pallor and rash.  Neurological: Negative for dizziness, syncope and light-headedness.  All other systems reviewed and are negative.    Physical Exam Updated Vital Signs BP 135/78 (BP Location: Right Arm)   Pulse (!) 107  Temp 98.5 F (36.9 C) (Oral)   Resp 12   Ht 5\' 3"  (1.6 m)   Wt (!) 167.8 kg   LMP 10/21/2018   SpO2 99%   BMI 65.54 kg/m   Physical Exam Vitals signs and nursing note reviewed.  Constitutional:      General: She is not in acute distress.    Appearance: She is well-developed.  HENT:     Head: Normocephalic and atraumatic.  Eyes:     Conjunctiva/sclera: Conjunctivae normal.  Neck:     Musculoskeletal: Neck supple.  Cardiovascular:     Rate and Rhythm: Normal rate and regular rhythm.     Pulses: Normal pulses.     Heart sounds: Normal heart sounds. No murmur.  Pulmonary:     Effort: Pulmonary effort is normal. No respiratory distress.     Breath sounds: Normal breath sounds. No wheezing, rhonchi or rales.  Abdominal:     General: Bowel sounds are normal. There is no distension.     Palpations: Abdomen is  soft.     Tenderness: There is no guarding or rebound.     Comments: Mild lower abd TTP without guarding or rebound  Genitourinary:    Comments: Exam performed by Karrie Meresortni S Ismael Karge,  exam chaperoned Date: 10/21/2018 Pelvic exam: normal external genitalia without evidence of trauma. VULVA: normal appearing vulva with no masses, tenderness or lesion. VAGINA: normal appearing vagina with normal color and discharge, no lesions. Moderate blood noted in the vaginal vault CERVIX: normal appearing cervix without lesions, cervical motion tenderness absent, blood noted from the cervical os.  Wet prep and DNA probe for chlamydia and GC obtained.   ADNEXA: normal adnexa in size, mild left adnexal ttp UTERUS: uterus is normal size, shape, consistency and nontender.  Skin:    General: Skin is warm and dry.  Neurological:     Mental Status: She is alert.      ED Treatments / Results  Labs (all labs ordered are listed, but only abnormal results are displayed) Labs Reviewed  WET PREP, GENITAL - Abnormal; Notable for the following components:      Result Value   WBC, Wet Prep HPF POC FEW (*)    All other components within normal limits  CBC WITH DIFFERENTIAL/PLATELET - Abnormal; Notable for the following components:   Hemoglobin 10.4 (*)    HCT 33.6 (*)    MCV 70.6 (*)    MCH 21.8 (*)    RDW 20.4 (*)    All other components within normal limits  COMPREHENSIVE METABOLIC PANEL - Abnormal; Notable for the following components:   Potassium 3.3 (*)    Glucose, Bld 125 (*)    Calcium 8.6 (*)    Albumin 3.2 (*)    All other components within normal limits  HCG, QUANTITATIVE, PREGNANCY  TYPE AND SCREEN  GC/CHLAMYDIA PROBE AMP ([place]) NOT AT [facility]    EKG None  Radiology No results found.  Procedures Procedures (including critical care time)  Medications Ordered in ED Medications  ketorolac (TORADOL) 30 MG/ML injection 30 mg (has no administration in time range)     Initial  Impression / Assessment and Plan / ED Course  I have reviewed the triage vital signs and the nursing notes.  Pertinent labs & imaging results that were available during my care of the patient were reviewed by me and considered in my medical decision making (see chart for details).  Final Clinical Impressions(s) / ED Diagnoses   Final diagnoses:  Abnormal uterine bleeding (AUB)   28 year old female presenting for evaluation of vaginal bleeding ongoing for several months but worsened this last week.  Associated with some lower abdominal pain that has also been present for the last month.  Symptoms resolved by ibuprofen.  She has minimal suprapubic and left pelvic tenderness.  On pelvic exam she does have some blood in the vaginal vault with mild active bleeding.  No cervical motion tenderness or uterine tenderness she has some very mild left adnexal tenderness but no severe tenderness on exam.  CBC shows mild anemia, which appears chronic but slightly worse than prior. CMP with mild hypokalemia, otherwise baseline. Beta-hCG is negative Wet prep shows white blood cells, no clue cells or trichomonas.  Patient is not hypotensive.  She had 1 reading of marginal tachycardia on arrival however throughout my evaluation her heart rate was in the 80s.  She is well-appearing, and is stable.  I gave her a dose of Toradol for her pain.  I have scheduled her for an outpatient ultrasound tomorrow and have given her information to follow-up with an OB/GYN.  I will give Rx for ibuprofen for her pain.  Also give Rx for iron due to her anemia likely secondary to blood loss.  Have advised her to follow-up accordingly.  Discussed pacific return precautions.  She voices understanding of the plan and reasons to return.  All questions answered.  Patient stable for discharge.  ED Discharge Orders         Ordered    US PELVIC COMPLETE WITH TRANSVAGINAL     10/21/18 2016    ibuprofen (ADVIL) 600 MG tablet  Every 6  hours PRN     10/21/18 2022    ferrous sulfate 325 (65 FE) MG tablet  Daily     10/21/18 2022           Karrie MeresCouture, Lorrin Bodner S, PA-C 10/21/18 2034    Bethann BerkshireZammit, Joseph, MD 10/23/18 [phone]

## 2018-10-21 NOTE — Discharge Instructions (Addendum)
Your wet prep did not show any signs of bacterial vaginosis or trichomonas. You have been tested for chlamydia and gonorrhea.  These results will be available in approximately 3 days and you will be contacted by the hospital if the results are positive. Avoid sexual contact until you are aware of the results, and please inform all sexual partners if you test positive for any of these diseases.  Call radiology scheduling at [phone] to arrange your appointment time for you ultrasound.  Please take medications as prescribed.   You were given a referral to an OB-GYN. Please call the office to schedule an appointment for follow up.  Please return to the emergency department if you have any new or worsening symptoms. You should also return if you have continued heavy bleeding, lightheadedness, shortness of breath, chest pain, or passing out.

## 2018-10-23 LAB — GC/CHLAMYDIA PROBE AMP (~~LOC~~) NOT AT ARMC
Chlamydia: NEGATIVE
Neisseria Gonorrhea: NEGATIVE

## 2018-12-23 ENCOUNTER — Other Ambulatory Visit: Payer: Self-pay

## 2018-12-23 DIAGNOSIS — Z20822 Contact with and (suspected) exposure to covid-19: Secondary | ICD-10-CM

## 2018-12-24 LAB — NOVEL CORONAVIRUS, NAA: SARS-CoV-2, NAA: NOT DETECTED

## 2019-01-04 ENCOUNTER — Other Ambulatory Visit: Payer: Self-pay

## 2019-01-04 ENCOUNTER — Emergency Department (HOSPITAL_COMMUNITY)
Admission: EM | Admit: 2019-01-04 | Discharge: 2019-01-04 | Disposition: A | Discharge status: Home / Self Care | Payer: Self-pay | Attending: Emergency Medicine | Admitting: Emergency Medicine

## 2019-01-04 ENCOUNTER — Emergency Department (HOSPITAL_COMMUNITY): Payer: Self-pay

## 2019-01-04 DIAGNOSIS — R079 Chest pain, unspecified: Secondary | ICD-10-CM | POA: Insufficient documentation

## 2019-01-04 DIAGNOSIS — Z5321 Procedure and treatment not carried out due to patient leaving prior to being seen by health care provider: Secondary | ICD-10-CM | POA: Insufficient documentation

## 2019-01-04 LAB — CBC
HCT: 36.6 % (ref 36.0–46.0)
Hemoglobin: 11.3 g/dL — ABNORMAL LOW (ref 12.0–15.0)
MCH: 20.7 pg — ABNORMAL LOW (ref 26.0–34.0)
MCHC: 30.9 g/dL (ref 30.0–36.0)
MCV: 67 fL — ABNORMAL LOW (ref 80.0–100.0)
Platelets: 251 10*3/uL (ref 150–400)
RBC: 5.46 MIL/uL — ABNORMAL HIGH (ref 3.87–5.11)
RDW: 20.7 % — ABNORMAL HIGH (ref 11.5–15.5)
WBC: 6.5 10*3/uL (ref 4.0–10.5)
nRBC: 0 % (ref 0.0–0.2)

## 2019-01-04 LAB — BASIC METABOLIC PANEL
Anion gap: 9 (ref 5–15)
BUN: 9 mg/dL (ref 6–20)
CO2: 24 mmol/L (ref 22–32)
Calcium: 8.9 mg/dL (ref 8.9–10.3)
Chloride: 105 mmol/L (ref 98–111)
Creatinine, Ser: 0.98 mg/dL (ref 0.44–1.00)
GFR calc Af Amer: >60 mL/min (ref >60)
GFR calc non Af Amer: >60 mL/min (ref >60)
Glucose, Bld: 117 mg/dL — ABNORMAL HIGH (ref 70–99)
Potassium: 3.9 mmol/L (ref 3.5–5.1)
Sodium: 138 mmol/L (ref 135–145)

## 2019-01-04 LAB — TROPONIN I (HIGH SENSITIVITY)
Troponin I (High Sensitivity): 3 ng/L (ref <18)
Troponin I (High Sensitivity): 4 ng/L (ref <18)

## 2019-01-04 LAB — I-STAT BETA HCG BLOOD, ED (MC, WL, AP ONLY): I-stat hCG, quantitative: <5 m[IU]/mL (ref <5)

## 2019-01-04 NOTE — ED Triage Notes (Signed)
Pt c/o CP that started 2-3 days ago. Pt states she "stopped being able to smell or taste anything at the first of the week"

## 2019-01-04 NOTE — ED Notes (Signed)
No answer for vitals recheck x1 

## 2019-01-04 NOTE — ED Notes (Signed)
Pt states she is just going to go to urgent care tomorrow because she is tired of wait.

## 2019-09-11 ENCOUNTER — Ambulatory Visit
Admission: EM | Admit: 2019-09-11 | Discharge: 2019-09-11 | Disposition: A | Discharge status: Home / Self Care | Payer: Medicaid Other | Attending: Emergency Medicine | Admitting: Emergency Medicine

## 2019-09-11 ENCOUNTER — Other Ambulatory Visit: Payer: Self-pay

## 2019-09-11 DIAGNOSIS — Z113 Encounter for screening for infections with a predominantly sexual mode of transmission: Secondary | ICD-10-CM

## 2019-09-11 DIAGNOSIS — N898 Other specified noninflammatory disorders of vagina: Secondary | ICD-10-CM

## 2019-09-11 DIAGNOSIS — R1032 Left lower quadrant pain: Secondary | ICD-10-CM

## 2019-09-11 LAB — POCT URINALYSIS DIP (MANUAL ENTRY)
Bilirubin, UA: NEGATIVE
Blood, UA: NEGATIVE
Glucose, UA: NEGATIVE mg/dL
Ketones, POC UA: NEGATIVE mg/dL
Leukocytes, UA: NEGATIVE
Nitrite, UA: NEGATIVE
Protein Ur, POC: NEGATIVE mg/dL
Spec Grav, UA: 1.025 (ref 1.010–1.025)
Urobilinogen, UA: 0.2 E.U./dL
pH, UA: 5.5 (ref 5.0–8.0)

## 2019-09-11 LAB — POCT URINE PREGNANCY: Preg Test, Ur: NEGATIVE

## 2019-09-11 MED ORDER — METRONIDAZOLE 500 MG PO TABS
500.0000 mg | ORAL_TABLET | Freq: Two times a day (BID) | ORAL | 0 refills | Status: DC
Start: 2019-09-11 — End: 2021-04-05

## 2019-09-11 MED ORDER — AZITHROMYCIN 500 MG PO TABS
1000.0000 mg | ORAL_TABLET | Freq: Once | ORAL | Status: AC
  Administered 2019-09-11: 1000 mg via ORAL

## 2019-09-11 MED ORDER — CEFTRIAXONE SODIUM 1 G IJ SOLR
1.0000 g | Freq: Once | INTRAMUSCULAR | Status: AC
  Administered 2019-09-11: 1 g via INTRAMUSCULAR

## 2019-09-11 NOTE — ED Triage Notes (Signed)
Pt presents with c/o lower abdominal pain. Pt states it began about a month ago  But improved then returned yesterday.Pt has slo had some constipation recently as well . Pt reports with urinary frequency as well and would like std check

## 2019-09-11 NOTE — Discharge Instructions (Signed)
Urine does not show signs of UTI Urine pregnancy negative Vaginal self-swab obtained.  We will follow up with you regarding abnormal results Given rocephin 1 gram injection and azithromycin 1g in office HIV/ syphilis testing today Prescribed metronidazole 500 mg twice daily for 7 days (do not take while consuming alcohol and/or if breastfeeding) Take medication as prescribed and to completion If tests results are positive, please abstain from sexual activity until you and your partner(s) have been treated Follow up with PCP if symptoms persists Return here or go to ER if you have any new or worsening symptoms fever, chills, nausea, vomiting, abdominal or pelvic pain, painful intercourse, vaginal discharge, vaginal bleeding, persistent symptoms despite treatment, etc..Marland Kitchen

## 2019-09-11 NOTE — ED Provider Notes (Signed)
[facility]   [id number] 09/11/19 Arrival Time: 1553   CC: VAGINAL DISCHARGE  SUBJECTIVE:  Meagan Oneill is a 29 y.o. female who presents with complaints of lower abdominal discomfort, clear vaginal discharge, and urinary frequency x 1 day.  Denies precipitating event or trauma.  Last sex was last night, used condoms.  Denies pain with sexual activity.  Patient has been sexually active with 2 female partners over the past 6 months.  Partners without symptoms.  Concerned for STDs at this time and would like treatment.  She has tried OTC medications with minimal relief.  Worse to the touch.  She reports similar symptoms in the past with constipation.  She denies fever, chills, nausea, vomiting, pelvic pain, vaginal itching, vaginal odor, vaginal bleeding, dyspareunia, vaginal rashes or lesions.   Patient's last menstrual period was 08/08/2019. Current birth control method: Not on BC, but intermittent condom use  ROS: As per HPI.  All other pertinent ROS negative.     Past Medical History:  Diagnosis Date  . Asthma   . Environmental allergies    Past Surgical History:  Procedure Laterality Date  . NO PAST SURGERIES     Allergies  Allergen Reactions  . Morphine And Related Hives   No current facility-administered medications on file prior to encounter.   Current Outpatient Medications on File Prior to Encounter  Medication Sig Dispense Refill  . ibuprofen (ADVIL) 600 MG tablet Take 1 tablet (600 mg total) by mouth every 6 (six) hours as needed. 30 tablet 0  . [DISCONTINUED] ferrous sulfate 325 (65 FE) MG tablet Take 1 tablet (325 mg total) by mouth daily for 14 days. 14 tablet 0    Social History   Socioeconomic History  . Marital status: Single    Spouse name: Not on file  . Number of children: Not on file  . Years of education: Not on file  . Highest education level: Not on file  Occupational History  . Not on file  Tobacco Use  . Smoking status: Never Smoker  .  Smokeless tobacco: Never Used  Substance and Sexual Activity  . Alcohol use: No  . Drug use: No  . Sexual activity: Not on file  Other Topics Concern  . Not on file  Social History Narrative  . Not on file   Social Determinants of Health   Financial Resource Strain:   . Difficulty of Paying Living Expenses:   Food Insecurity:   . Worried About Programme researcher, broadcasting/film/video in the Last Year:   . Barista in the Last Year:   Transportation Needs:   . Freight forwarder (Medical):   Marland Kitchen Lack of Transportation (Non-Medical):   Physical Activity:   . Days of Exercise per Week:   . Minutes of Exercise per Session:   Stress:   . Feeling of Stress :   Social Connections:   . Frequency of Communication with Friends and Family:   . Frequency of Social Gatherings with Friends and Family:   . Attends Religious Services:   . Active Member of Clubs or Organizations:   . Attends Banker Meetings:   Marland Kitchen Marital Status:   Intimate Partner Violence:   . Fear of Current or Ex-Partner:   . Emotionally Abused:   Marland Kitchen Physically Abused:   . Sexually Abused:    Family History  Problem Relation Age of Onset  . Hypertension Mother   . Diabetes Mother   . Hypertension Sister  OBJECTIVE:  Vitals:   09/11/19 1603  BP: (!) 138/91  Pulse: 97  Resp: 18  Temp: 97.9 F (36.6 C)  SpO2: 95%     General appearance: Alert, NAD, appears stated age Head: NCAT Throat: lips, mucosa, and tongue normal; teeth and gums normal Lungs: CTA bilaterally without adventitious breath sounds Heart: regular rate and rhythm.   Back: no CVA tenderness Abdomen: protuberant, soft, non-tender; bowel sounds normal; no guarding GU: defered Skin: warm and dry Psychological:  Alert and cooperative. Normal mood and affect.  LABS:  Results for orders placed or performed during the hospital encounter of 09/11/19  POCT urinalysis dipstick  Result Value Ref Range   Color, UA yellow yellow   Clarity,  UA clear clear   Glucose, UA negative negative mg/dL   Bilirubin, UA negative negative   Ketones, POC UA negative negative mg/dL   Spec Grav, UA 1.025 1.010 - 1.025   Blood, UA negative negative   pH, UA 5.5 5.0 - 8.0   Protein Ur, POC negative negative mg/dL   Urobilinogen, UA 0.2 0.2 or 1.0 E.U./dL   Nitrite, UA Negative Negative   Leukocytes, UA Negative Negative  POCT urine pregnancy  Result Value Ref Range   Preg Test, Ur Negative Negative    Labs Reviewed  RPR  HIV ANTIBODY (ROUTINE TESTING W REFLEX)  POCT URINALYSIS DIP (MANUAL ENTRY)  POCT URINE PREGNANCY  CERVICOVAGINAL ANCILLARY ONLY    ASSESSMENT & PLAN:  1. Abdominal discomfort in left lower quadrant   2. Vaginal discharge   3. Screening examination for venereal disease     Meds ordered this encounter  Medications  . metroNIDAZOLE (FLAGYL) 500 MG tablet    Sig: Take 1 tablet (500 mg total) by mouth 2 (two) times daily.    Dispense:  14 tablet    Refill:  0    Order Specific Question:   Supervising Provider    Answer:   Raylene Everts [[id number]]  . azithromycin (ZITHROMAX) tablet 1,000 mg  . cefTRIAXone (ROCEPHIN) injection 1 g    Pending: Labs Reviewed  RPR  HIV ANTIBODY (ROUTINE TESTING W REFLEX)  POCT URINALYSIS DIP (MANUAL ENTRY)  POCT URINE PREGNANCY  CERVICOVAGINAL ANCILLARY ONLY   Urine does not show signs of UTI Urine pregnancy negative Vaginal self-swab obtained.  We will follow up with you regarding abnormal results Given rocephin 1 gram injection and azithromycin 1g in office HIV/ syphilis testing today Prescribed metronidazole 500 mg twice daily for 7 days (do not take while consuming alcohol and/or if breastfeeding) Take medication as prescribed and to completion If tests results are positive, please abstain from sexual activity until you and your partner(s) have been treated Follow up with PCP if symptoms persists Return here or go to ER if you have any new or worsening symptoms  fever, chills, nausea, vomiting, abdominal or pelvic pain, painful intercourse, vaginal discharge, vaginal bleeding, persistent symptoms despite treatment, etc...  Reviewed expectations re: course of current medical issues. Questions answered. Outlined signs and symptoms indicating need for more acute intervention. Patient verbalized understanding. After Visit Summary given.       Lestine Box, PA-C 09/11/19 1634

## 2019-09-12 LAB — HIV ANTIBODY (ROUTINE TESTING W REFLEX): HIV Screen 4th Generation wRfx: NONREACTIVE

## 2019-09-12 LAB — RPR: RPR Ser Ql: NONREACTIVE

## 2019-09-15 LAB — CERVICOVAGINAL ANCILLARY ONLY
Bacterial Vaginitis (gardnerella): POSITIVE — AB
Candida Glabrata: NEGATIVE
Candida Vaginitis: NEGATIVE
Chlamydia: NEGATIVE
Comment: NEGATIVE
Comment: NEGATIVE
Comment: NEGATIVE
Comment: NEGATIVE
Comment: NEGATIVE
Comment: NORMAL
Neisseria Gonorrhea: NEGATIVE
Trichomonas: POSITIVE — AB

## 2020-12-16 DIAGNOSIS — Z1331 Encounter for screening for depression: Secondary | ICD-10-CM | POA: Diagnosis not present

## 2020-12-16 DIAGNOSIS — Z0389 Encounter for observation for other suspected diseases and conditions ruled out: Secondary | ICD-10-CM | POA: Diagnosis not present

## 2020-12-16 DIAGNOSIS — Z114 Encounter for screening for human immunodeficiency virus [HIV]: Secondary | ICD-10-CM | POA: Diagnosis not present

## 2020-12-16 DIAGNOSIS — Z113 Encounter for screening for infections with a predominantly sexual mode of transmission: Secondary | ICD-10-CM | POA: Diagnosis not present

## 2020-12-16 DIAGNOSIS — Z1388 Encounter for screening for disorder due to exposure to contaminants: Secondary | ICD-10-CM | POA: Diagnosis not present

## 2020-12-16 DIAGNOSIS — Z01419 Encounter for gynecological examination (general) (routine) without abnormal findings: Secondary | ICD-10-CM | POA: Diagnosis not present

## 2020-12-16 DIAGNOSIS — Z3009 Encounter for other general counseling and advice on contraception: Secondary | ICD-10-CM | POA: Diagnosis not present

## 2021-02-14 ENCOUNTER — Emergency Department (HOSPITAL_COMMUNITY): Payer: Worker's Compensation

## 2021-02-14 ENCOUNTER — Emergency Department (HOSPITAL_COMMUNITY)
Admission: EM | Admit: 2021-02-14 | Discharge: 2021-02-15 | Disposition: A | Discharge status: Home / Self Care | Payer: Worker's Compensation | Attending: Emergency Medicine | Admitting: Emergency Medicine

## 2021-02-14 DIAGNOSIS — W010XXA Fall on same level from slipping, tripping and stumbling without subsequent striking against object, initial encounter: Secondary | ICD-10-CM | POA: Diagnosis not present

## 2021-02-14 DIAGNOSIS — J45909 Unspecified asthma, uncomplicated: Secondary | ICD-10-CM | POA: Diagnosis not present

## 2021-02-14 DIAGNOSIS — S83105A Unspecified dislocation of left knee, initial encounter: Secondary | ICD-10-CM | POA: Insufficient documentation

## 2021-02-14 DIAGNOSIS — W19XXXA Unspecified fall, initial encounter: Secondary | ICD-10-CM | POA: Diagnosis not present

## 2021-02-14 DIAGNOSIS — I10 Essential (primary) hypertension: Secondary | ICD-10-CM | POA: Diagnosis not present

## 2021-02-14 DIAGNOSIS — S8992XA Unspecified injury of left lower leg, initial encounter: Secondary | ICD-10-CM | POA: Diagnosis present

## 2021-02-14 DIAGNOSIS — R0902 Hypoxemia: Secondary | ICD-10-CM | POA: Diagnosis not present

## 2021-02-14 MED ORDER — ONDANSETRON HCL 4 MG/2ML IJ SOLN
4.0000 mg | Freq: Once | INTRAMUSCULAR | Status: AC
  Administered 2021-02-14: 4 mg via INTRAVENOUS
  Filled 2021-02-14: qty 2

## 2021-02-14 MED ORDER — PROPOFOL 10 MG/ML IV BOLUS: 0.5000 mg/kg | Freq: Once | INTRAVENOUS | Status: DC

## 2021-02-14 MED ORDER — HYDROMORPHONE HCL 1 MG/ML IJ SOLN
1.0000 mg | Freq: Once | INTRAMUSCULAR | Status: AC
  Administered 2021-02-14: 1 mg via INTRAVENOUS
  Filled 2021-02-14: qty 1

## 2021-02-14 NOTE — ED Triage Notes (Signed)
Slipped and fell on top floor supine, felt left knee pop. No LOC, no neck or back pain.  Upper left leg and knee pain. Pulses and sensation intact EMS gave 200 mcg fent  BP: 146/94

## 2021-02-14 NOTE — Discharge Instructions (Addendum)
It was our pleasure to provide your ER care today - we hope that you feel better.  Wear knee immobilizer, icepack/coldpack to sore area. Elevate leg.   Take acetaminophen or ibuprofen as need.   Follow up with orthopedist in the coming week - call office tomorrow AM to arrange appointment.   Return to ER if worse, new, severe, or worsening pain, numbness/weakness, or other concern.   No driving until cleared to do so by your doctor.

## 2021-02-14 NOTE — Progress Notes (Signed)
Orthopedic Tech Progress Note Patient Details:  Meagan Oneill [birth year] [id number]  Ortho Devices Type of Ortho Device: Knee Immobilizer Ortho Device/Splint Location: lle Ortho Device/Splint Interventions: Ordered, Application, Adjustment   Post Interventions Patient Tolerated: Well Instructions Provided: Care of device, Adjustment of device  Trinna Post 02/14/2021, 10:36 PM

## 2021-02-14 NOTE — ED Provider Notes (Signed)
[facility] EMERGENCY DEPARTMENT Provider Note   CSN: [id number] Arrival date & time: 02/14/21  1946     History Chief Complaint  Patient presents with   Fall   Leg Injury    Meagan Oneill is a 30 y.o. female.  Pt c/o slip and fall while at work just pta today - arrives by EMS. States left knee/upper leg twisted awkwardly underneath her. C/o left upper leg and knee pain - symptoms acute onset, moderate-severe, dull, constant, worse w movement, non radiating. No associated numbness/weakness. Skin intact. No ankle or lower leg pain. Denies other pain or injury. No headache. No neck or back pain. No cp or sob. No abd pain or nv. No anticoag use. Felt fine, asymptomatic prior to fall - no faintness or dizziness.   The history is provided by the patient, the EMS personnel and medical records.  Fall Pertinent negatives include no chest pain, no abdominal pain, no headaches and no shortness of breath.      Past Medical History:  Diagnosis Date   Asthma    Environmental allergies     There are no problems to display for this patient.   Past Surgical History:  Procedure Laterality Date   NO PAST SURGERIES       OB History     Gravida  1   Para      Term      Preterm      AB  1   Living         SAB  1   IAB      Ectopic      Multiple      Live Births              Family History  Problem Relation Age of Onset   Hypertension Mother    Diabetes Mother    Hypertension Sister     Social History   Tobacco Use   Smoking status: Never   Smokeless tobacco: Never  Substance Use Topics   Alcohol use: No   Drug use: No    Home Medications Prior to Admission medications   Medication Sig Start Date End Date Taking? Authorizing Provider  ibuprofen (ADVIL) 600 MG tablet Take 1 tablet (600 mg total) by mouth every 6 (six) hours as needed. 10/21/18   Couture, Cortni S, PA-C  metroNIDAZOLE (FLAGYL) 500 MG tablet Take 1 tablet (500 mg total)  by mouth 2 (two) times daily. 09/11/19   Wurst, [place], PA-C  ferrous sulfate 325 (65 FE) MG tablet Take 1 tablet (325 mg total) by mouth daily for 14 days. 10/21/18 09/11/19  Couture, Cortni S, PA-C    Allergies    Morphine and related  Review of Systems   Review of Systems  Constitutional:  Negative for fever.  HENT:  Negative for nosebleeds.   Eyes:  Negative for redness.  Respiratory:  Negative for shortness of breath.   Cardiovascular:  Negative for chest pain.  Gastrointestinal:  Negative for abdominal pain, nausea and vomiting.  Genitourinary:  Negative for flank pain.  Musculoskeletal:  Negative for back pain and neck pain.  Skin:  Negative for wound.  Neurological:  Negative for weakness, numbness and headaches.  Hematological:  Does not bruise/bleed easily.  Psychiatric/Behavioral:  Negative for confusion.    Physical Exam Updated Vital Signs BP (!) 155/96 (BP Location: Right Arm)   Pulse 75   Temp 97.8 F (36.6 C) (Oral)   Resp (!) 22  SpO2 100%   Physical Exam Vitals and nursing note reviewed.  Constitutional:      Appearance: Normal appearance. She is well-developed.  HENT:     Head: Atraumatic.     Nose: Nose normal.     Mouth/Throat:     Mouth: Mucous membranes are moist.  Eyes:     General: No scleral icterus.    Conjunctiva/sclera: Conjunctivae normal.     Pupils: Pupils are equal, round, and reactive to light.  Neck:     Trachea: No tracheal deviation.  Cardiovascular:     Rate and Rhythm: Normal rate and regular rhythm.     Pulses: Normal pulses.     Heart sounds: Normal heart sounds. No murmur heard.   No friction rub. No gallop.  Pulmonary:     Effort: Pulmonary effort is normal. No respiratory distress.     Breath sounds: Normal breath sounds.  Chest:     Chest wall: No tenderness.  Abdominal:     General: Bowel sounds are normal. There is no distension.     Palpations: Abdomen is soft.     Tenderness: There is no abdominal tenderness.  There is no guarding.  Genitourinary:    Comments: No cva tenderness.  Musculoskeletal:        General: No swelling.     Cervical back: Normal range of motion and neck supple. No rigidity. No muscular tenderness.     Comments: Tenderness left hip, thigh and knee areas. No gross sts noted. Distal pulses palp. Not able to move/rom left knee due to pain ?deformity.  On bilateral extremity exam, no other focal pain or bony tenderness. CTLS spine, non tender, aligned, no step off.   Skin:    General: Skin is warm and dry.     Findings: No rash.  Neurological:     Mental Status: She is alert.     Comments: Alert, speech normal.  GCS 15. Motor/sens grossly intact bil.   Psychiatric:        Mood and Affect: Mood normal.    ED Results / Procedures / Treatments   Labs (all labs ordered are listed, but only abnormal results are displayed) Labs Reviewed - No data to display  EKG None  Radiology DG Knee Complete 4 Views Left  Result Date: 02/14/2021 CLINICAL DATA:  Fall, left knee pain EXAM: LEFT FEMUR 2 VIEWS; LEFT KNEE - COMPLETE 4+ VIEW COMPARISON:  None. FINDINGS: Left knee dislocation with anterior and slight medial displacement of the tibia and fibula relative to the distal femur. Patella remains located anterior to the trochlea. No evidence of acute fracture. Hip joint is aligned. Diffuse soft tissue prominence. IMPRESSION: Anterior left knee dislocation.  No evidence of acute fracture. Electronically Signed   By: Duanne Guess D.O.   On: 02/14/2021 21:09   DG HIP UNILAT W OR W/O PELVIS 2-3 VIEWS LEFT  Result Date: 02/14/2021 CLINICAL DATA:  Fall. EXAM: DG HIP (WITH OR WITHOUT PELVIS) 2-3V LEFT COMPARISON:  None. FINDINGS: There is no evidence of hip fracture or dislocation. There is no evidence of arthropathy or other focal bone abnormality. IMPRESSION: Negative. Electronically Signed   By: Signa Kell M.D.   On: 02/14/2021 21:06   DG Femur Min 2 Views Left  Result Date:  02/14/2021 CLINICAL DATA:  Fall, left knee pain EXAM: LEFT FEMUR 2 VIEWS; LEFT KNEE - COMPLETE 4+ VIEW COMPARISON:  None. FINDINGS: Left knee dislocation with anterior and slight medial displacement of the tibia and fibula  relative to the distal femur. Patella remains located anterior to the trochlea. No evidence of acute fracture. Hip joint is aligned. Diffuse soft tissue prominence. IMPRESSION: Anterior left knee dislocation.  No evidence of acute fracture. Electronically Signed   By: Duanne Guess D.O.   On: 02/14/2021 21:09    Procedures .Ortho Injury Treatment  Date/Time: 02/14/2021 9:33 PM Performed by: Cathren Laine, MD Authorized by: Cathren Laine, MD   Consent:    Consent given by:  PatientInjury location: knee Location details: left knee Injury type: dislocation Dislocation type: anterior Pre-procedure neurovascular assessment: neurovascularly intact Pre-procedure distal perfusion: normal Pre-procedure neurological function: normal Pre-procedure range of motion: reduced  Patient sedated: Yes. Refer to sedation procedure documentation for details of sedation. Manipulation performed: gentle downward/distal traction of leg, while knee mildly/gently flexed and mild pressure applied to anterior/proximal tiba. Immobilization: brace (knee immobilizer) Splint Applied by: Milon Dikes Post-procedure neurovascular assessment: post-procedure neurovascularly intact Post-procedure distal perfusion: normal Post-procedure neurological function: normal Post-procedure range of motion: improved     Medications Ordered in ED Medications - No data to display  ED Course  I have reviewed the triage vital signs and the nursing notes.  Pertinent labs & imaging results that were available during my care of the patient were reviewed by me and considered in my medical decision making (see chart for details).    MDM Rules/Calculators/A&P                          Imaging studies ordered.    Reviewed nursing notes and prior charts for additional history.   Xrays reviewed/interpreted by me - dislocation left knee. Orthopedist on call consulted.   Discussed pt and xray with Dr Roda Shutters - he indicates for [place] to reduce in ED, and then to get ct angio, and if ok to d/c to home, outpt follow up, knee imm.   Recheck, pain had improved post fentanyl by EMS, now returns post back from xray. Distal pulse palp. Normal color and warmth to foot. Dilaudid 1 mg iv. Zofran iv.   Whiling preparing for procedural sedation, pt had good pain control and sedation with dilaudid - knee able to be reduced. Knee immobilizer applied. Icepack.   Recheck foot, nvi. Pain controlled.   Recheck again at 2315, pain controlled, pulses palp, no increased edema compared to prior. CT remains pending. Signed out to Dr Eudelia Bunch to check CT angio when resulted, recheck pt, and dispo appropriately.       Final Clinical Impression(s) / ED Diagnoses Final diagnoses:  None    Rx / DC Orders ED Discharge Orders     None        Cathren Laine, MD 02/14/21 2317

## 2021-02-15 ENCOUNTER — Emergency Department (HOSPITAL_COMMUNITY): Payer: Worker's Compensation

## 2021-02-15 LAB — I-STAT BETA HCG BLOOD, ED (MC, WL, AP ONLY): I-stat hCG, quantitative: <5 m[IU]/mL (ref <5)

## 2021-02-15 LAB — I-STAT CHEM 8, ED
BUN: 7 mg/dL (ref 6–20)
Calcium, Ion: 1.15 mmol/L (ref 1.15–1.40)
Chloride: 105 mmol/L (ref 98–111)
Creatinine, Ser: 0.7 mg/dL (ref 0.44–1.00)
Glucose, Bld: 94 mg/dL (ref 70–99)
HCT: 33 % — ABNORMAL LOW (ref 36.0–46.0)
Hemoglobin: 11.2 g/dL — ABNORMAL LOW (ref 12.0–15.0)
Potassium: 3.7 mmol/L (ref 3.5–5.1)
Sodium: 142 mmol/L (ref 135–145)
TCO2: 26 mmol/L (ref 22–32)

## 2021-02-15 MED ORDER — KETOROLAC TROMETHAMINE 60 MG/2ML IM SOLN
30.0000 mg | Freq: Once | INTRAMUSCULAR | Status: AC
  Administered 2021-02-15: 30 mg via INTRAMUSCULAR
  Filled 2021-02-15: qty 2

## 2021-02-15 MED ORDER — IOHEXOL 350 MG/ML SOLN
100.0000 mL | Freq: Once | INTRAVENOUS | Status: AC | PRN
  Administered 2021-02-15: 100 mL via INTRAVENOUS

## 2021-02-16 ENCOUNTER — Emergency Department (HOSPITAL_COMMUNITY)
Admission: EM | Admit: 2021-02-16 | Discharge: 2021-02-16 | Disposition: A | Discharge status: Home / Self Care | Payer: No Typology Code available for payment source | Attending: Emergency Medicine | Admitting: Emergency Medicine

## 2021-02-16 ENCOUNTER — Emergency Department (HOSPITAL_COMMUNITY): Payer: No Typology Code available for payment source

## 2021-02-16 ENCOUNTER — Other Ambulatory Visit: Payer: Self-pay

## 2021-02-16 ENCOUNTER — Encounter (HOSPITAL_COMMUNITY): Payer: Self-pay

## 2021-02-16 DIAGNOSIS — W19XXXD Unspecified fall, subsequent encounter: Secondary | ICD-10-CM | POA: Diagnosis not present

## 2021-02-16 DIAGNOSIS — I10 Essential (primary) hypertension: Secondary | ICD-10-CM | POA: Diagnosis not present

## 2021-02-16 DIAGNOSIS — M25562 Pain in left knee: Secondary | ICD-10-CM

## 2021-02-16 DIAGNOSIS — J45909 Unspecified asthma, uncomplicated: Secondary | ICD-10-CM | POA: Diagnosis not present

## 2021-02-16 DIAGNOSIS — Z7401 Bed confinement status: Secondary | ICD-10-CM | POA: Diagnosis not present

## 2021-02-16 DIAGNOSIS — W19XXXA Unspecified fall, initial encounter: Secondary | ICD-10-CM | POA: Diagnosis not present

## 2021-02-16 DIAGNOSIS — S80919A Unspecified superficial injury of unspecified knee, initial encounter: Secondary | ICD-10-CM | POA: Diagnosis not present

## 2021-02-16 DIAGNOSIS — R279 Unspecified lack of coordination: Secondary | ICD-10-CM | POA: Diagnosis not present

## 2021-02-16 DIAGNOSIS — T148XXA Other injury of unspecified body region, initial encounter: Secondary | ICD-10-CM | POA: Diagnosis not present

## 2021-02-16 DIAGNOSIS — S83105D Unspecified dislocation of left knee, subsequent encounter: Secondary | ICD-10-CM | POA: Insufficient documentation

## 2021-02-16 HISTORY — DX: Essential (primary) hypertension: I10

## 2021-02-16 MED ORDER — IOHEXOL 350 MG/ML SOLN
125.0000 mL | Freq: Once | INTRAVENOUS | Status: AC | PRN
  Administered 2021-02-16: 125 mL via INTRAVENOUS

## 2021-02-16 MED ORDER — OXYCODONE-ACETAMINOPHEN 5-325 MG PO TABS: 1.0000 | ORAL_TABLET | Freq: Four times a day (QID) | ORAL | 0 refills | Status: DC | PRN

## 2021-02-16 MED ORDER — OXYCODONE-ACETAMINOPHEN 5-325 MG PO TABS
1.0000 | ORAL_TABLET | Freq: Once | ORAL | Status: AC
  Administered 2021-02-16: 1 via ORAL
  Filled 2021-02-16: qty 1

## 2021-02-16 NOTE — ED Notes (Signed)
Put knee brace on pt that she bought from home .

## 2021-02-16 NOTE — ED Notes (Signed)
Pt requesting home health services. EDP notified. Consult to social work ordered.

## 2021-02-16 NOTE — ED Triage Notes (Signed)
Left knee pain after fall. [facility] called out to assist up after fall. Was able to ambulate with crutches. Pt req xray of left knee to ensure no further injury to knee. (Monday pt dislocated knee from previous fall)

## 2021-02-16 NOTE — ED Notes (Signed)
Pt placed on bedpan

## 2021-02-16 NOTE — ED Notes (Signed)
Patient transported to CT 

## 2021-02-16 NOTE — ED Notes (Signed)
Patient transported to MRI 

## 2021-02-16 NOTE — Discharge Instructions (Addendum)
Use a walker to ambulate with.  Follow-up with Dr. Romeo Apple here in [place] or Dr.Xu in [place].  Call today for an appointment.

## 2021-02-16 NOTE — ED Notes (Signed)
Pt states that she is unable to get in and out of the car to go home and needs EMS to transport back home. Secretary calling EMS at this time.

## 2021-02-16 NOTE — ED Notes (Signed)
Patient transported to X-ray 

## 2021-02-16 NOTE — ED Notes (Signed)
Pt taken off of bedpan. Pericare provided and linens changed. Bedside commode for home brought to bedside.

## 2021-02-16 NOTE — ED Provider Notes (Signed)
Prescription for #20 Percocet 5/325 mg electronically prescribed by me due to malfunction of prescribing database used by Dr. Estell Harpin.     Pauline Aus, PA-C 02/16/21 1040    Bethann Berkshire, MD 02/17/21 [phone]

## 2021-02-16 NOTE — ED Notes (Addendum)
Note in error.

## 2021-02-16 NOTE — ED Notes (Signed)
CSW consulted for help in the home and DME needs. CSW met with pt and her mother in the ED. Pts mother states pt needs help in the home and that she is on workers comp. CSW reached out to [place] with [employer] who states that they do not accept workers comp and that this is the same with most all companies. CSW spoke with pt and her mother about no [place] agency taking workers comp. They are understanding. CSW spoke to [place] with [employer] to see if they could do a charity [facility] for pt. Orders placed and Caryl Pina to work on it. CSW updated family that it would be delivered to the home if they can supply it. TOC signing off.

## 2021-02-16 NOTE — ED Notes (Signed)
Patient back from  X-ray 

## 2021-02-16 NOTE — ED Provider Notes (Signed)
[facility] EMERGENCY DEPARTMENT Provider Note   CSN: [id number] Arrival date & time: 02/16/21  7829     History Chief Complaint  Patient presents with   Knee Pain    Meagan Oneill is a 30 y.o. female.  Patient presents to the emergency department for knee pain.  Patient reports that she had a fall 2 days ago and was seen at [facility].  She was told that she had a dislocated knee at that time.  She is in a knee immobilizer and is using crutches.  Patient reports that she had a fall tonight.  She tried to put weight on the knee and it felt like it gave out.  She is concerned that it may be dislocated or injured further.      Past Medical History:  Diagnosis Date   Asthma    Environmental allergies    Hypertension     There are no problems to display for this patient.   Past Surgical History:  Procedure Laterality Date   NO PAST SURGERIES       OB History     Gravida  1   Para      Term      Preterm      AB  1   Living         SAB  1   IAB      Ectopic      Multiple      Live Births              Family History  Problem Relation Age of Onset   Hypertension Mother    Diabetes Mother    Hypertension Sister     Social History   Tobacco Use   Smoking status: Never   Smokeless tobacco: Never  Substance Use Topics   Alcohol use: Yes    Alcohol/week: 1.0 standard drink    Types: 1 Glasses of wine per week   Drug use: No    Home Medications Prior to Admission medications   Medication Sig Start Date End Date Taking? Authorizing Provider  ibuprofen (ADVIL) 600 MG tablet Take 1 tablet (600 mg total) by mouth every 6 (six) hours as needed. 10/21/18   Couture, Cortni S, PA-C  metroNIDAZOLE (FLAGYL) 500 MG tablet Take 1 tablet (500 mg total) by mouth 2 (two) times daily. 09/11/19   Wurst, [place], PA-C  ferrous sulfate 325 (65 FE) MG tablet Take 1 tablet (325 mg total) by mouth daily for 14 days. 10/21/18 09/11/19  Couture, Cortni S, PA-C     Allergies    Morphine and related  Review of Systems   Review of Systems  Musculoskeletal:  Positive for arthralgias.  All other systems reviewed and are negative.  Physical Exam Updated Vital Signs BP 122/79   Pulse 92   Temp 97.8 F (36.6 C) (Oral)   Resp 17   Ht 5\' 4"  (1.626 m)   Wt (!) 172.4 kg   SpO2 100%   BMI 65.23 kg/m   Physical Exam Vitals and nursing note reviewed.  Constitutional:      General: She is not in acute distress.    Appearance: Normal appearance. She is well-developed.  HENT:     Head: Normocephalic and atraumatic.     Right Ear: Hearing normal.     Left Ear: Hearing normal.     Nose: Nose normal.  Eyes:     Conjunctiva/sclera: Conjunctivae normal.     Pupils: Pupils are equal,  round, and reactive to light.  Cardiovascular:     Rate and Rhythm: Regular rhythm.     Heart sounds: S1 normal and S2 normal. No murmur heard.   No friction rub. No gallop.  Pulmonary:     Effort: Pulmonary effort is normal. No respiratory distress.     Breath sounds: Normal breath sounds.  Chest:     Chest wall: No tenderness.  Abdominal:     General: Bowel sounds are normal.     Palpations: Abdomen is soft.     Tenderness: There is no abdominal tenderness. There is no guarding or rebound. Negative signs include Murphy's sign and McBurney's sign.     Hernia: No hernia is present.  Musculoskeletal:     Cervical back: Normal range of motion and neck supple.     Left knee: Bony tenderness present. No deformity or erythema. Decreased range of motion. Tenderness present.  Skin:    General: Skin is warm and dry.     Findings: No rash.  Neurological:     Mental Status: She is alert and oriented to person, place, and time.     GCS: GCS eye subscore is 4. GCS verbal subscore is 5. GCS motor subscore is 6.     Cranial Nerves: No cranial nerve deficit.     Sensory: No sensory deficit.     Coordination: Coordination normal.  Psychiatric:        Speech: Speech  normal.        Behavior: Behavior normal.        Thought Content: Thought content normal.    ED Results / Procedures / Treatments   Labs (all labs ordered are listed, but only abnormal results are displayed) Labs Reviewed - No data to display  EKG None  Radiology CT ANGIO LOW EXTREM LEFT W &/OR WO CONTRAST  Result Date: 02/15/2021 CLINICAL DATA:  Left lower extremity trauma and knee dislocation. EXAM: CT ANGIOGRAPHY OF THE left lowerEXTREMITY TECHNIQUE: Multidetector CT imaging of the left lowerwas performed using the standard protocol during bolus administration of intravenous contrast. Multiplanar CT image reconstructions and MIPs were obtained to evaluate the vascular anatomy. CONTRAST:  OMNIPAQUE IOHEXOL 350 MG/ML SOLN COMPARISON:  Left lower extremity radiograph dated 02/14/2021. FINDINGS: There is no acute fracture or dislocation. Mild lateral subluxation of the patella concerning for destructive changes of the medial patellar retinaculum. MRI may provide better evaluation if clinically indicated. There is mild narrowing of the medial compartment of the knee. A small suprapatellar effusion noted. The iliac arteries, common femoral, deep and superficial femoral arteries, and the popliteal artery are patent. No evidence of traumatic injury. No extravasation of contrast to suggest active bleed. The trifurcation of the popliteal artery is patent. The visualized portion of the calf arteries are patent. Mild soft tissue edema surrounding the knee. No fluid collection or hematoma. Review of the MIP images confirms the above findings. IMPRESSION: 1. No acute fracture or dislocation. 2. Mild lateral subluxation of the patella concerning for destructive changes of the medial patellar retinaculum. MRI may provide better evaluation if clinically indicated. 3. Small suprapatellar effusion. 4. No evidence of traumatic arterial injury or active bleed. Electronically Signed   By: Elgie Collard M.D.    On: 02/15/2021 03:44   DG Knee Complete 4 Views Left  Result Date: 02/16/2021 CLINICAL DATA:  Left knee dislocation from fall EXAM: LEFT KNEE - COMPLETE 4+ VIEW COMPARISON:  02/14/2021 FINDINGS: No evidence of fracture, dislocation, or joint effusion. No evidence  of arthropathy or other focal bone abnormality. Soft tissues are unremarkable. IMPRESSION: Located knee.  Negative for fracture. Electronically Signed   By: Tiburcio Pea M.D.   On: 02/16/2021 06:20   DG Knee Complete 4 Views Left  Result Date: 02/14/2021 CLINICAL DATA:  Fall, left knee pain EXAM: LEFT FEMUR 2 VIEWS; LEFT KNEE - COMPLETE 4+ VIEW COMPARISON:  None. FINDINGS: Left knee dislocation with anterior and slight medial displacement of the tibia and fibula relative to the distal femur. Patella remains located anterior to the trochlea. No evidence of acute fracture. Hip joint is aligned. Diffuse soft tissue prominence. IMPRESSION: Anterior left knee dislocation.  No evidence of acute fracture. Electronically Signed   By: Duanne Guess D.O.   On: 02/14/2021 21:09   DG HIP UNILAT W OR W/O PELVIS 2-3 VIEWS LEFT  Result Date: 02/14/2021 CLINICAL DATA:  Fall. EXAM: DG HIP (WITH OR WITHOUT PELVIS) 2-3V LEFT COMPARISON:  None. FINDINGS: There is no evidence of hip fracture or dislocation. There is no evidence of arthropathy or other focal bone abnormality. IMPRESSION: Negative. Electronically Signed   By: Signa Kell M.D.   On: 02/14/2021 21:06   DG Femur Min 2 Views Left  Result Date: 02/14/2021 CLINICAL DATA:  Fall, left knee pain EXAM: LEFT FEMUR 2 VIEWS; LEFT KNEE - COMPLETE 4+ VIEW COMPARISON:  None. FINDINGS: Left knee dislocation with anterior and slight medial displacement of the tibia and fibula relative to the distal femur. Patella remains located anterior to the trochlea. No evidence of acute fracture. Hip joint is aligned. Diffuse soft tissue prominence. IMPRESSION: Anterior left knee dislocation.  No evidence of acute  fracture. Electronically Signed   By: Duanne Guess D.O.   On: 02/14/2021 21:09    Procedures Procedures   Medications Ordered in ED Medications - No data to display  ED Course  I have reviewed the triage vital signs and the nursing notes.  Pertinent labs & imaging results that were available during my care of the patient were reviewed by me and considered in my medical decision making (see chart for details).    MDM Rules/Calculators/A&P                           Patient presents with repeat left knee injury.  Patient suffered a knee dislocation 2 days ago.  She was sent home with a knee immobilizer.  She has been getting around with crutches.  Tonight she tried to put weight on her left knee and it gave out.  She reports that it felt like it "popped out and then back in again".  Patient does have palpable dorsalis pedal pulse.  X-ray shows no dislocation currently.  Based on her previous x-rays, I suspect significant internal derangement of the knee.  I cannot rule out repeat dislocation based on her mechanism.  We will therefore recommend repeat CT angiography to rule out arterial injury.  Additionally, will obtain MRI to further evaluate knee prior to referral to orthopedics.  Will sign out to oncoming ER physician to follow-up on results.  Final Clinical Impression(s) / ED Diagnoses Final diagnoses:  Knee dislocation, left, subsequent encounter    Rx / DC Orders ED Discharge Orders     None        Curtis Cain, Canary Brim, MD 02/16/21 [phone]

## 2021-02-17 ENCOUNTER — Telehealth: Payer: Self-pay | Admitting: Orthopedic Surgery

## 2021-02-17 NOTE — Telephone Encounter (Signed)
Called patient in response to voice message 02/16/21, asking to schedule appointment following Emergency room visit at [facility] been unable to reach due to voice mail full. Called alternate/emergency # on file, left message

## 2021-02-18 NOTE — Telephone Encounter (Signed)
Patient returned call; aware of appointment scheduled with Dr Romeo Apple.

## 2021-02-22 ENCOUNTER — Telehealth: Payer: Self-pay

## 2021-02-22 ENCOUNTER — Ambulatory Visit (INDEPENDENT_AMBULATORY_CARE_PROVIDER_SITE_OTHER): Payer: Self-pay | Admitting: Orthopaedic Surgery

## 2021-02-22 ENCOUNTER — Other Ambulatory Visit: Payer: Self-pay

## 2021-02-22 ENCOUNTER — Telehealth: Payer: Self-pay | Admitting: Orthopedic Surgery

## 2021-02-22 ENCOUNTER — Encounter: Payer: Self-pay | Admitting: Orthopaedic Surgery

## 2021-02-22 DIAGNOSIS — S83105A Unspecified dislocation of left knee, initial encounter: Secondary | ICD-10-CM

## 2021-02-22 NOTE — Telephone Encounter (Signed)
I called patient. Appt for 1:15pm tomorrow afternoon per Dr August Saucer.

## 2021-02-22 NOTE — Telephone Encounter (Signed)
-----   Message from Vickki Hearing, MD sent at 02/18/2021 11:12 AM EST ----- Regarding: RE: Please review [facility] ER 11/7 and 11/9 re: appt we've scheduled/ ?need sooner per MRI,CT Xrays reviewed/interpreted by me - dislocation left knee. Orthopedist on call consulted.   Discussed pt and xray with Dr Roda Shutters - he indicates for [place] to reduce in ED, and then to get ct angio, and if ok to d/c to home, outpt follow up, knee imm.   Case discussed with Dr Roda Shutters   Call his office and let him know she needs f/u appt  ----- Message ----- From: Doristine Section Sent: 02/18/2021   8:42 AM EST To: Vickki Hearing, MD Subject: Please review [facility] ER 11/7 and 11/9 re: appt w#  Dr Romeo Apple:  RE: Guajardo, [place] [[id number]] - pt returned call this morning 02/18/21 - scheduled for Wed 11/16,  but per 2nd ER visit 02/16/21, MRI done at ER - do we need to move up pt to [facility], 02/21/21?

## 2021-02-22 NOTE — Telephone Encounter (Signed)
Return for needs f/u appt with August Saucer ASAP for multiligamentous knee.  Per Dr Roda Shutters urgent app needed  Please call pt

## 2021-02-22 NOTE — Telephone Encounter (Signed)
Per Dr Mort Sawyers note/review 02/18/21, see response.   - per staff administrator Toniann Fail, patient scheduled and aware of appointment with Dr Roda Shutters (for 02/22/21)

## 2021-02-22 NOTE — Progress Notes (Signed)
Office Visit Note   Patient: Meagan Oneill           Date of Birth: [birth year]           MRN: [id number] Visit Date: 02/22/2021              Requested by: No referring provider defined for this encounter. PCP: Patient, No Pcp Per (Inactive)   Assessment & Plan: Visit Diagnoses:  1. Left knee dislocation, initial encounter     Plan: Impression is status post left knee dislocation with multi ligamentous injuries.  We will figure with a better knee immobilizer.  She is to be nonweightbearing.  Prescription for wheelchair.  We will send a referral to Dr. August Saucer for surgical consultation.  I will take her out of work for now.  Follow-Up Instructions: Return for needs f/u appt with August Saucer ASAP for multiligamentous knee.   Orders:  No orders of the defined types were placed in this encounter.  No orders of the defined types were placed in this encounter.     Procedures: No procedures performed   Clinical Data: No additional findings.   Subjective: Chief Complaint  Patient presents with   Left Knee - New Patient (Initial Visit)    Patient is a 30 year old female follow-up from the ED on 11/7.  She had this reduced under sedation by the ED provider and placed in a knee immobilizer.  She then tried to place weight on the leg and felt like it slipped out again and went back to the ER.  Fortunately x-rays show that the knee was still in place.  She follows up today for further evaluation and treatment.  She reports some numbness and tingling in her toes.  Denies any motor deficits.   Review of Systems  Constitutional: Negative.   HENT: Negative.    Eyes: Negative.   Respiratory: Negative.    Cardiovascular: Negative.   Endocrine: Negative.   Musculoskeletal: Negative.   Neurological: Negative.   Hematological: Negative.   Psychiatric/Behavioral: Negative.    All other systems reviewed and are negative.   Objective: Vital Signs: There were no vitals taken for this  visit.  Physical Exam Vitals and nursing note reviewed.  Constitutional:      Appearance: She is well-developed.  Pulmonary:     Effort: Pulmonary effort is normal.  Skin:    General: Skin is warm.     Capillary Refill: Capillary refill takes less than 2 seconds.  Neurological:     Mental Status: She is alert and oriented to person, place, and time.  Psychiatric:        Behavior: Behavior normal.        Thought Content: Thought content normal.        Judgment: Judgment normal.    Ortho Exam  Left knee exam is limited by large habitus and soft tissue envelope.  Alignment of the knee is grossly normal.  No neurovascular compromise distally.  Ligamentous exam not performed.  Specialty Comments:  No specialty comments available.  Imaging: No results found.   PMFS History: There are no problems to display for this patient.  Past Medical History:  Diagnosis Date   Asthma    Environmental allergies    Hypertension     Family History  Problem Relation Age of Onset   Hypertension Mother    Diabetes Mother    Hypertension Sister     Past Surgical History:  Procedure Laterality Date   NO PAST SURGERIES  Social History   Occupational History   Not on file  Tobacco Use   Smoking status: Never   Smokeless tobacco: Never  Substance and Sexual Activity   Alcohol use: Yes    Alcohol/week: 1.0 standard drink    Types: 1 Glasses of wine per week   Drug use: No   Sexual activity: Not on file

## 2021-02-23 ENCOUNTER — Ambulatory Visit (INDEPENDENT_AMBULATORY_CARE_PROVIDER_SITE_OTHER): Payer: Worker's Compensation | Admitting: Orthopedic Surgery

## 2021-02-23 ENCOUNTER — Ambulatory Visit: Payer: Medicaid Other | Admitting: Orthopedic Surgery

## 2021-02-23 ENCOUNTER — Other Ambulatory Visit: Payer: Self-pay

## 2021-02-23 DIAGNOSIS — S83105A Unspecified dislocation of left knee, initial encounter: Secondary | ICD-10-CM

## 2021-02-24 ENCOUNTER — Encounter: Payer: Self-pay | Admitting: Orthopedic Surgery

## 2021-02-24 NOTE — Progress Notes (Signed)
Office Visit Note   Patient: Meagan Oneill           Date of Birth: 03-06-1991           MRN: [id number] Visit Date: 02/23/2021 Requested by: No referring provider defined for this encounter. PCP: Patient, No Pcp Per (Inactive)  Subjective: Chief Complaint  Patient presents with   Left Knee - Pain    HPI: Meagan Oneill is a 30 y.o. female who presents to the office complaining of left knee pain and instability.  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  She landed awkwardly on her left knee and instantly felt severe pain with difficulty weightbearing.  She was not really able to get up and was escorted to the emergency department where she was diagnosed with left knee dislocation and left knee was reduced.  By her history, the left knee was dislocated for 5 hours..  She had subsequent MRI of the left knee on 02/16/2021 that demonstrated multi ligament injury with complete ACL tear, PCL tear, grade 1 MCL sprain, grade 2 LCL sprain.  Currently she is having severe pain in the left knee diffusely with inability to bear weight on the leg.  The knee is unstable and she needs 1-2 person assist just for ambulation out of her wheelchair.  She denies any history of previous left knee injury.  Only medical history that she has is seasonal allergies.  She denies any smoking, use of blood thinners, family/personal history of DVT/PE.              ROS: All systems reviewed are negative as they relate to the chief complaint within the history of present illness.  Patient denies fevers or chills.  Assessment & Plan: Visit Diagnoses:  1. Left knee dislocation, initial encounter     Plan: Patient is a 30 year old female who presents for evaluation of left knee injury.  She sustained left knee dislocation on 02/14/2021.  No history of prior left knee injury.  This injury happened at work when she slipped on water and fell onto her knee.  She has had MRI that after  review does demonstrate complete ACL tear with what appears to be a complete PCL tear and proximal LCL injury as well.  Discussed options available to patient.  She cannot live her life as her knee is currently and the best option for her would be to pursue multi ligament reconstruction.  This multi ligament procedure will likely consist of ACL reconstruction with bone patellar tendon bone allograft, PCL reconstruction with quad allograft versus PCL repair with sutures placed through the proximal PCL and brought through a tunnel with a cortical button, posterior lateral corner reconstruction with semitendinosis allograft.  Discussed the length of the procedure as well as the risks and benefits of the procedure.  Risk and benefits include nerve/vessel damage, knee stiffness, continued knee instability, need for revision surgery, medical complication from surgery such as DVT/PE, infection/wound dehiscence.  Anticipate using incisional wound VAC on the lateral incision for the posterior lateral corner reconstruction.  She is neurovascularly intact on exam today.  After lengthy discussion of procedure, patient wishes to proceed and she will be posted for surgery.  She was arranged to have CPM to use 3 hours/day and she will return to the office in 1 week for reevaluation of range of motion.  Follow-Up Instructions: No follow-ups on file.   Orders:  No orders of  the defined types were placed in this encounter.  No orders of the defined types were placed in this encounter.     Procedures: No procedures performed   Clinical Data: No additional findings.  Objective: Vital Signs: There were no vitals taken for this visit.  Physical Exam:  Constitutional: Patient appears well-developed HEENT:  Head: Normocephalic Eyes:EOM are normal Neck: Normal range of motion Cardiovascular: Normal rate Pulmonary/chest: Effort normal Neurologic: Patient is alert Skin: Skin is warm Psychiatric: Patient has  normal mood and affect  Ortho Exam: Ortho exam demonstrates left knee with moderate to large effusion present.  About 3 to 5 degrees of hyperextension with flexion unable to be evaluated due to patient's severe guarding and pain.  She really only flexes to about 15 to 20 degrees in the clinic today.  She has tenderness diffusely throughout the left knee.  Due to patient's guarding and body habitus it is very difficult to perform adequate ligamentous exam.  She is able to perform a painful straight leg raise with extensor mechanism intact and no extensor lag.  No calf tenderness.  Negative Homans' sign.  No significant laxity to valgus stress when compared with contralateral knee but she does have what feels to be some increased laxity to varus stress compared with the contralateral leg.  Unable to really assess posterior lateral corner stability as patient cannot really tolerate enough flexion to evaluate it and she cannot make her self prone for dial test.  2+ DP pulse and 2+ PT pulse of left lower extremity.  Intact dorsiflexion, plantarflexion, EHL.  Sensation intact through dorsal and plantar foot as well as the first webspace.  Specialty Comments:  No specialty comments available.  Imaging: No results found.   PMFS History: There are no problems to display for this patient.  Past Medical History:  Diagnosis Date   Asthma    Environmental allergies    Hypertension     Family History  Problem Relation Age of Onset   Hypertension Mother    Diabetes Mother    Hypertension Sister     Past Surgical History:  Procedure Laterality Date   NO PAST SURGERIES     Social History   Occupational History   Not on file  Tobacco Use   Smoking status: Never   Smokeless tobacco: Never  Substance and Sexual Activity   Alcohol use: Yes    Alcohol/week: 1.0 standard drink    Types: 1 Glasses of wine per week   Drug use: No   Sexual activity: Not on file

## 2021-02-25 ENCOUNTER — Encounter: Payer: Self-pay | Admitting: Orthopedic Surgery

## 2021-03-02 ENCOUNTER — Other Ambulatory Visit: Payer: Self-pay

## 2021-03-02 ENCOUNTER — Ambulatory Visit (INDEPENDENT_AMBULATORY_CARE_PROVIDER_SITE_OTHER): Payer: Self-pay | Admitting: Orthopedic Surgery

## 2021-03-02 DIAGNOSIS — S83105A Unspecified dislocation of left knee, initial encounter: Secondary | ICD-10-CM

## 2021-03-06 ENCOUNTER — Encounter: Payer: Self-pay | Admitting: Orthopedic Surgery

## 2021-03-06 NOTE — Progress Notes (Signed)
   Office Visit Note   Patient: Meagan Oneill           Date of Birth: December 02, 1990           MRN: [id number] Visit Date: 03/02/2021 Requested by: No referring provider defined for this encounter. PCP: Patient, No Pcp Per (Inactive)  Subjective: Chief Complaint  Patient presents with   Left Knee - Follow-up    DOI: 02/14/21    HPI: MBR is a 30 year old patient with left knee dislocation.  Date of injury 02/14/2021.  She is walking with a walker.  Here for recheck on motion.  She has ACL PCL and lateral sided injury.  She has had no symptoms of instability since her initial injury.              ROS: All systems reviewed are negative as they relate to the chief complaint within the history of present illness.  Patient denies  fevers or chills.   Assessment & Plan: Visit Diagnoses:  1. Left knee dislocation, initial encounter     Plan: Impression is slow improvement in range of motion left knee.  I think she is likely heading for some type of reconstruction; however, her knee does feel somewhat more stable today.  Plan is continue to work on range of motion and come back in a week.  When she gets to 90 degrees we can do a supine assessment of stability.  No calf tenderness negative Homans today.  Follow-Up Instructions: No follow-ups on file.   Orders:  No orders of the defined types were placed in this encounter.  No orders of the defined types were placed in this encounter.     Procedures: No procedures performed   Clinical Data: No additional findings.  Objective: Vital Signs: There were no vitals taken for this visit.  Physical Exam:   Constitutional: Patient appears well-developed HEENT:  Head: Normocephalic Eyes:EOM are normal Neck: Normal range of motion Cardiovascular: Normal rate Pulmonary/chest: Effort normal Neurologic: Patient is alert Skin: Skin is warm Psychiatric: Patient has normal mood and affect   Ortho Exam: Ortho exam demonstrates palpable pedal  pulses with intact ankle dorsiflexion on the left.  Does have some lateral sided laxity.  AP laxity more difficult to assess due to soft tissue envelope.  Extensor mechanism is intact.  Specialty Comments:  No specialty comments available.  Imaging: No results found.   PMFS History: There are no problems to display for this patient.  Past Medical History:  Diagnosis Date   Asthma    Environmental allergies    Hypertension     Family History  Problem Relation Age of Onset   Hypertension Mother    Diabetes Mother    Hypertension Sister     Past Surgical History:  Procedure Laterality Date   NO PAST SURGERIES     Social History   Occupational History   Not on file  Tobacco Use   Smoking status: Never   Smokeless tobacco: Never  Substance and Sexual Activity   Alcohol use: Yes    Alcohol/week: 1.0 standard drink    Types: 1 Glasses of wine per week   Drug use: No   Sexual activity: Not on file

## 2021-03-09 ENCOUNTER — Other Ambulatory Visit: Payer: Self-pay

## 2021-03-09 ENCOUNTER — Encounter: Payer: Self-pay | Admitting: Orthopedic Surgery

## 2021-03-09 ENCOUNTER — Ambulatory Visit (INDEPENDENT_AMBULATORY_CARE_PROVIDER_SITE_OTHER): Payer: Worker's Compensation | Admitting: Orthopedic Surgery

## 2021-03-09 DIAGNOSIS — S83105A Unspecified dislocation of left knee, initial encounter: Secondary | ICD-10-CM

## 2021-03-09 NOTE — Progress Notes (Signed)
   Office Visit Note   Patient: Meagan Oneill           Date of Birth: [birth year]           MRN: [id number] Visit Date: 03/09/2021 Requested by: No referring provider defined for this encounter. PCP: Patient, No Pcp Per (Inactive)  Subjective: Chief Complaint  Patient presents with   Left Knee - Follow-up    HPI: Meagan is a 30 year old patient with left knee dislocation.  Date of injury 02/14/2021.  She has been able to pivot and stand with a walker.  Also using knee immobilizer.  Still has some mild or symptomatic instability by description.  She has been working on range of motion particularly flexion.              ROS: All systems reviewed are negative as they relate to the chief complaint within the history of present illness.  Patient denies  fevers or chills.   Assessment & Plan: Visit Diagnoses:  1. Left knee dislocation, initial encounter     Plan: Impression is slowly improving range of motion.  The knee feels a little bit more stable than it did right at the time of her first evaluation.  Plan is to make definitive recommendation for or against surgery when she is achieved 90 degrees of motion.  Check her back in a week for clinical recheck.  Follow-Up Instructions: Return in about 1 week (around 03/16/2021).   Orders:  No orders of the defined types were placed in this encounter.  No orders of the defined types were placed in this encounter.     Procedures: No procedures performed   Clinical Data: No additional findings.  Objective: Vital Signs: There were no vitals taken for this visit.  Physical Exam:   Constitutional: Patient appears well-developed HEENT:  Head: Normocephalic Eyes:EOM are normal Neck: Normal range of motion Cardiovascular: Normal rate Pulmonary/chest: Effort normal Neurologic: Patient is alert Skin: Skin is warm Psychiatric: Patient has normal mood and affect   Ortho Exam: Ortho exam demonstrates no calf tenderness negative Homans.   Range of motion is about 0-70.  Extensor mechanism intact.  Does feel like she has less anterior posterior instability but the posterior lateral rotatory instability is about the same.  Pedal pulses palpable.  Dorsiflexion intact.  Specialty Comments:  No specialty comments available.  Imaging: No results found.   PMFS History: There are no problems to display for this patient.  Past Medical History:  Diagnosis Date   Asthma    Environmental allergies    Hypertension     Family History  Problem Relation Age of Onset   Hypertension Mother    Diabetes Mother    Hypertension Sister     Past Surgical History:  Procedure Laterality Date   NO PAST SURGERIES     Social History   Occupational History   Not on file  Tobacco Use   Smoking status: Never   Smokeless tobacco: Never  Substance and Sexual Activity   Alcohol use: Yes    Alcohol/week: 1.0 standard drink    Types: 1 Glasses of wine per week   Drug use: No   Sexual activity: Not on file

## 2021-03-16 ENCOUNTER — Other Ambulatory Visit: Payer: Self-pay

## 2021-03-16 ENCOUNTER — Ambulatory Visit (INDEPENDENT_AMBULATORY_CARE_PROVIDER_SITE_OTHER): Payer: Self-pay | Admitting: Surgical

## 2021-03-16 DIAGNOSIS — S83105A Unspecified dislocation of left knee, initial encounter: Secondary | ICD-10-CM

## 2021-03-20 ENCOUNTER — Encounter: Payer: Self-pay | Admitting: Orthopedic Surgery

## 2021-03-20 NOTE — Progress Notes (Signed)
Office Visit Note   Patient: Meagan Oneill           Date of Birth: 1990-05-19           MRN: [id number] Visit Date: 03/16/2021 Requested by: No referring provider defined for this encounter. PCP: Patient, No Pcp Per (Inactive)  Subjective: Chief Complaint  Patient presents with   Left Knee - Routine Post Op, Follow-up    Date of injury 02/14/2021    HPI: Meagan Seefeld is a 30 y.o. female who presents to the office complaining of left knee instability.  Patient is here today for range of motion recheck on the left knee prior to her planned surgery on 03/25/2021 she has been working on achieving as much flexion as she can primarily by doing it herself and not using any CP machine or therapy.  She is having difficulty getting Worker's Comp. to approve her treatment and so she has had to acquire an attorney.  She denies any chest pain or shortness of breath or calf pain currently.  No cool foot or any complaint of numbness or tingling in the distal extremity..                ROS: All systems reviewed are negative as they relate to the chief complaint within the history of present illness.  Patient denies fevers or chills.  Assessment & Plan: Visit Diagnoses: No diagnosis found.  Plan: Patient is a 30 year old female who presents for repeat evaluation of left knee following left knee dislocation.  Here today for range of motion recheck and she has done a great job achieving motion from 0 degrees of extension to 95 degrees of knee flexion.  She is neurovascular intact on exam today.  Plan to proceed with multi ligament reconstruction on 03/25/2021.  Due to the extensive surgery and rehabilitation she will need as well as her body habitus and nonweightbearing status after surgery, we will have to strongly consider discharge to skilled nursing facility after her procedure.  This was discussed in depth with the patient today and she ultimately agreed that this would be the best course of action.  Also  discussed an option of waiting until [employer]. approves her procedure but she states that she is stretched thin due to having family help her live her life and having to pay bills continuously without any steady income as she is not working so she cannot afford to take several months to see how long it will take for [employer]. to approve her situation.  Plan to press on with surgery next week.  Follow-Up Instructions: No follow-ups on file.   Orders:  No orders of the defined types were placed in this encounter.  No orders of the defined types were placed in this encounter.     Procedures: No procedures performed   Clinical Data: No additional findings.  Objective: Vital Signs: There were no vitals taken for this visit.  Physical Exam:  Constitutional: Patient appears well-developed HEENT:  Head: Normocephalic Eyes:EOM are normal Neck: Normal range of motion Cardiovascular: Normal rate Pulmonary/chest: Effort normal Neurologic: Patient is alert Skin: Skin is warm Psychiatric: Patient has normal mood and affect  Ortho Exam: Ortho exam demonstrates left knee with 0 degrees extension 95 degrees of knee flexion.  DP and PT pulses are palpable in the left leg.  Intact dorsiflexion and plantarflexion.  No calf tenderness.  Negative Homans' sign.  Specialty Comments:  No specialty comments available.  Imaging: No results found.  PMFS History: There are no problems to display for this patient.  Past Medical History:  Diagnosis Date   Asthma    Environmental allergies    Hypertension     Family History  Problem Relation Age of Onset   Hypertension Mother    Diabetes Mother    Hypertension Sister     Past Surgical History:  Procedure Laterality Date   NO PAST SURGERIES     Social History   Occupational History   Not on file  Tobacco Use   Smoking status: Never   Smokeless tobacco: Never  Substance and Sexual Activity   Alcohol use: Yes     Alcohol/week: 1.0 standard drink    Types: 1 Glasses of wine per week   Drug use: No   Sexual activity: Not on file

## 2021-03-21 NOTE — Patient Instructions (Signed)
DUE TO COVID-19 ONLY ONE VISITOR IS ALLOWED TO COME WITH YOU AND STAY IN THE WAITING ROOM ONLY DURING PRE OP AND PROCEDURE.   **NO VISITORS ARE ALLOWED IN THE SHORT STAY AREA OR RECOVERY ROOM!!**  IF YOU WILL BE ADMITTED INTO THE HOSPITAL YOU ARE ALLOWED ONLY TWO SUPPORT PEOPLE DURING VISITATION HOURS ONLY (7 AM -8PM)   The support person(s) must pass our screening, gel in and out, and wear a mask at all times, including in the patient's room. Patients must also wear a mask when staff or their support person are in the room. Visitors GUEST BADGE MUST BE WORN VISIBLY  One adult visitor may remain with you overnight and MUST be in the room by 8 P.M.  No visitors under the age of 82. Any visitor under the age of 89 must be accompanied by an adult.    COVID SWAB TESTING MUST BE COMPLETED ON: 03/23/21  **MUST PRESENT COMPLETED FORM AT TESTING SITE**    [address] [place] (backside of the building) You are not required to quarantine, however you are required to wear a well-fitted mask when you are out and around people not in your household.  Hand Hygiene often Do NOT share personal items Notify your provider if you are in close contact with someone who has COVID or you develop fever 100.4 or greater, new onset of sneezing, cough, sore throat, shortness of breath or body aches.  [facility] Entrance [address], [address], must go inside of the hospital, NOT A DRIVE THRU!  (Must self quarantine after testing. Follow instructions on handout.)       Your procedure is scheduled on: 03/25/21   Report to [facility] Main Entrance    Report to short stay at : 5:15 AM   Call this number if you have problems the morning of surgery [phone]   Do not eat food :After Midnight.   May have liquids until: 4:30 AM    day of surgery  CLEAR LIQUID DIET  Foods Allowed                                                                      Foods Excluded  Water, Black Coffee and tea, regular and decaf                             liquids that you cannot  Plain Jell-O in any flavor  (No red)                                           see through such as: Fruit ices (not with fruit pulp)                                     milk, soups, orange juice              Iced Popsicles (No red)  All solid food                                   Apple juices Sports drinks like Gatorade (No red) Lightly seasoned clear broth or consume(fat free) Sugar  Sample Menu Breakfast                                Lunch                                     Supper Cranberry juice                    Beef broth                            Chicken broth Jell-O                                     Grape juice                           Apple juice Coffee or tea                        Jell-O                                      Popsicle                                                Coffee or tea                        Coffee or tea     Oral Hygiene is also important to reduce your risk of infection.                                    Remember - BRUSH YOUR TEETH THE MORNING OF SURGERY WITH YOUR REGULAR TOOTHPASTE   Do NOT smoke after Midnight   Take these medicines the morning of surgery with A SIP OF WATER: N/A  DO NOT TAKE ANY ORAL DIABETIC MEDICATIONS DAY OF YOUR SURGERY                              You may not have any metal on your body including hair pins, jewelry, and body piercing             Do not wear make-up, lotions, powders, perfumes/cologne, or deodorant  Do not wear nail polish including gel and S&S, artificial/acrylic nails, or any other type of covering on natural nails including finger and toenails. If you have artificial nails, gel coating, etc. that needs to be removed by a nail salon please have this removed prior to surgery or surgery  may need to be canceled/ delayed if the surgeon/ anesthesia feels  like they are unable to be safely monitored.   Do not shave  48 hours prior to surgery.    Do not bring valuables to the hospital. [place].   Contacts, dentures or bridgework may not be worn into surgery.   Bring small overnight bag day of surgery.    Patients discharged on the day of surgery will not be allowed to drive home.   Special Instructions: Bring a copy of your healthcare power of attorney and living will documents         the day of surgery if you haven't scanned them before.              Please read over the following fact sheets you were given: IF YOU HAVE QUESTIONS ABOUT YOUR PRE-OP INSTRUCTIONS PLEASE CALL [phone]     [facility] - Preparing for Surgery Before surgery, you can play an important role.  Because skin is not sterile, your skin needs to be as free of germs as possible.  You can reduce the number of germs on your skin by washing with CHG (chlorahexidine gluconate) soap before surgery.  CHG is an antiseptic cleaner which kills germs and bonds with the skin to continue killing germs even after washing. Please DO NOT use if you have an allergy to CHG or antibacterial soaps.  If your skin becomes reddened/irritated stop using the CHG and inform your nurse when you arrive at Short Stay. Do not shave (including legs and underarms) for at least 48 hours prior to the first CHG shower.  You may shave your face/neck. Please follow these instructions carefully:  1.  Shower with CHG Soap the night before surgery and the  morning of Surgery.  2.  If you choose to wash your hair, wash your hair first as usual with your  normal  shampoo.  3.  After you shampoo, rinse your hair and body thoroughly to remove the  shampoo.                           4.  Use CHG as you would any other liquid soap.  You can apply chg directly  to the skin and wash                       Gently with a scrungie or clean washcloth.  5.  Apply the CHG  Soap to your body ONLY FROM THE NECK DOWN.   Do not use on face/ open                           Wound or open sores. Avoid contact with eyes, ears mouth and genitals (private parts).                       Wash face,  Genitals (private parts) with your normal soap.             6.  Wash thoroughly, paying special attention to the area where your surgery  will be performed.  7.  Thoroughly rinse your body with warm water from the neck down.  8.  DO NOT shower/wash with your normal soap after using and rinsing off  the CHG Soap.  9.  Pat yourself dry with a clean towel.            10.  Wear clean pajamas.            11.  Place clean sheets on your bed the night of your first shower and do not  sleep with pets. Day of Surgery : Do not apply any lotions/deodorants the morning of surgery.  Please wear clean clothes to the hospital/surgery center.  FAILURE TO FOLLOW THESE INSTRUCTIONS MAY RESULT IN THE CANCELLATION OF YOUR SURGERY PATIENT SIGNATURE_________________________________  NURSE SIGNATURE__________________________________  ________________________________________________________________________

## 2021-03-22 ENCOUNTER — Other Ambulatory Visit: Payer: Self-pay

## 2021-03-22 ENCOUNTER — Other Ambulatory Visit: Payer: Self-pay | Admitting: Orthopedic Surgery

## 2021-03-22 ENCOUNTER — Encounter (HOSPITAL_COMMUNITY)
Admission: RE | Admit: 2021-03-22 | Discharge: 2021-03-22 | Disposition: A | Discharge status: Home / Self Care | Payer: No Typology Code available for payment source | Source: Ambulatory Visit | Attending: Orthopedic Surgery | Admitting: Orthopedic Surgery

## 2021-03-22 ENCOUNTER — Encounter (HOSPITAL_COMMUNITY): Payer: Self-pay

## 2021-03-22 VITALS — BP 134/90 | HR 94 | Temp 98.2°F | Ht 63.0 in

## 2021-03-22 DIAGNOSIS — Z01818 Encounter for other preprocedural examination: Secondary | ICD-10-CM

## 2021-03-22 DIAGNOSIS — I2510 Atherosclerotic heart disease of native coronary artery without angina pectoris: Secondary | ICD-10-CM | POA: Insufficient documentation

## 2021-03-22 HISTORY — DX: Anemia, unspecified: D64.9

## 2021-03-22 LAB — BASIC METABOLIC PANEL
Anion gap: 8 (ref 5–15)
BUN: 12 mg/dL (ref 6–20)
CO2: 27 mmol/L (ref 22–32)
Calcium: 9.3 mg/dL (ref 8.9–10.3)
Chloride: 104 mmol/L (ref 98–111)
Creatinine, Ser: 0.68 mg/dL (ref 0.44–1.00)
GFR, Estimated: >60 mL/min (ref >60)
Glucose, Bld: 119 mg/dL — ABNORMAL HIGH (ref 70–99)
Potassium: 3.8 mmol/L (ref 3.5–5.1)
Sodium: 139 mmol/L (ref 135–145)

## 2021-03-22 LAB — CBC
HCT: 37 % (ref 36.0–46.0)
Hemoglobin: 11.4 g/dL — ABNORMAL LOW (ref 12.0–15.0)
MCH: 20.7 pg — ABNORMAL LOW (ref 26.0–34.0)
MCHC: 30.8 g/dL (ref 30.0–36.0)
MCV: 67.2 fL — ABNORMAL LOW (ref 80.0–100.0)
Platelets: 221 10*3/uL (ref 150–400)
RBC: 5.51 MIL/uL — ABNORMAL HIGH (ref 3.87–5.11)
RDW: 21.2 % — ABNORMAL HIGH (ref 11.5–15.5)
WBC: 8.3 10*3/uL (ref 4.0–10.5)
nRBC: 0 % (ref 0.0–0.2)

## 2021-03-22 NOTE — Progress Notes (Signed)
COVID Vaccine Completed:Yes Date COVID Vaccine completed: 2021 x 2 COVID vaccine manufacturer: Pfizer    COVID Test: 03/22/21 PCP - NO PCP Cardiologist -   Chest x-ray -  EKG -  Stress Test -  ECHO -  Cardiac Cath -  Pacemaker/ICD device last checked:  Sleep Study -  CPAP -   Fasting Blood Sugar -  Checks Blood Sugar _____ times a day  Blood Thinner Instructions: Aspirin Instructions: Last Dose:  Anesthesia review: Hx: HTN  Patient denies shortness of breath, fever, cough and chest pain at PAT appointment   Patient verbalized understanding of instructions that were given to them at the PAT appointment. Patient was also instructed that they will need to review over the PAT instructions again at home before surgery.

## 2021-03-22 NOTE — Progress Notes (Signed)
PAT appointment was done at [facility] and EKG were done as well.Instructions about chlorhexidine showers ,pre-op diet instructions,mouth care before surgery were provided.Pt. went for COVID test after the appointment.

## 2021-03-23 LAB — SARS CORONAVIRUS 2 (TAT 6-24 HRS): SARS Coronavirus 2: NEGATIVE

## 2021-03-24 ENCOUNTER — Other Ambulatory Visit: Payer: Self-pay

## 2021-03-24 ENCOUNTER — Encounter (HOSPITAL_COMMUNITY): Payer: Self-pay | Admitting: Orthopedic Surgery

## 2021-03-24 NOTE — Anesthesia Preprocedure Evaluation (Addendum)
Anesthesia Evaluation  Patient identified by MRN, date of birth, ID band Patient awake    Reviewed: Allergy & Precautions, NPO status , Patient's Chart, lab work & pertinent test results  Airway Mallampati: III  TM Distance: >3 FB Neck ROM: Full    Dental no notable dental hx.    Pulmonary asthma ,    Pulmonary exam normal        Cardiovascular hypertension,  Rhythm:Regular Rate:Normal     Neuro/Psych negative neurological ROS  negative psych ROS   GI/Hepatic negative GI ROS, Neg liver ROS,   Endo/Other  negative endocrine ROS  Renal/GU negative Renal ROS  negative genitourinary   Musculoskeletal Left knee dislocation    Abdominal Normal abdominal exam  (+)   Peds  Hematology  (+) anemia ,   Anesthesia Other Findings   Reproductive/Obstetrics                            Anesthesia Physical Anesthesia Plan  ASA: 3  Anesthesia Plan: General and Regional   Post-op Pain Management: Regional block   Induction: Intravenous  PONV Risk Score and Plan: 3 and Ondansetron, Dexamethasone, Midazolam and Treatment may vary due to age or medical condition  Airway Management Planned: Mask and Oral ETT  Additional Equipment: None  Intra-op Plan:   Post-operative Plan: Extubation in OR  Informed Consent: I have reviewed the patients History and Physical, chart, labs and discussed the procedure including the risks, benefits and alternatives for the proposed anesthesia with the patient or authorized representative who has indicated his/her understanding and acceptance.     Dental advisory given  Plan Discussed with: CRNA  Anesthesia Plan Comments: (Lab Results      Component                Value               Date                      WBC                      8.3                 03/22/2021                HGB                      11.4 (L)            03/22/2021                HCT                       37.0                03/22/2021                MCV                      67.2 (L)            03/22/2021                PLT                      221  03/22/2021           Lab Results      Component                Value               Date                      NA                       139                 03/22/2021                K                        3.8                 03/22/2021                CO2                      27                  03/22/2021                GLUCOSE                  119 (H)             03/22/2021                BUN                      12                  03/22/2021                CREATININE               0.68                03/22/2021                CALCIUM                  9.3                 03/22/2021                GFRNONAA                 >60                 03/22/2021          )       Anesthesia Quick Evaluation

## 2021-03-24 NOTE — Progress Notes (Signed)
Pt's surgery moved to [facility] OR. Pt had PAT appt at [facility] on 03/22/21.   Reports no changes in medications or health history.  Reviewed instructions: Arrival time/location, NPO after MN except for clear liquids until 4:15am, shower with CHG soap tonight and in the morning, no makeup/jewelry/lotion/deodorant/nailpolish, no NSAIDs, medications to take in AM.  Pt verbalized understanding and asked appropriate questions.

## 2021-03-25 ENCOUNTER — Ambulatory Visit (HOSPITAL_COMMUNITY): Payer: No Typology Code available for payment source | Admitting: Anesthesiology

## 2021-03-25 ENCOUNTER — Ambulatory Visit (HOSPITAL_COMMUNITY): Payer: No Typology Code available for payment source

## 2021-03-25 ENCOUNTER — Inpatient Hospital Stay (HOSPITAL_COMMUNITY)
Admission: RE | Admit: 2021-03-25 | Discharge: 2021-04-05 | DRG: 488 | Disposition: A | Discharge status: Rehabilitation Facility | Payer: No Typology Code available for payment source | Source: Ambulatory Visit | Attending: Orthopedic Surgery | Admitting: Orthopedic Surgery

## 2021-03-25 ENCOUNTER — Encounter (HOSPITAL_COMMUNITY)
Admission: RE | Disposition: A | Discharge status: Rehabilitation Facility | Payer: Self-pay | Source: Ambulatory Visit | Attending: Orthopedic Surgery

## 2021-03-25 ENCOUNTER — Other Ambulatory Visit: Payer: Self-pay

## 2021-03-25 ENCOUNTER — Encounter (HOSPITAL_COMMUNITY): Payer: Self-pay | Admitting: Orthopedic Surgery

## 2021-03-25 DIAGNOSIS — S8992XD Unspecified injury of left lower leg, subsequent encounter: Secondary | ICD-10-CM

## 2021-03-25 DIAGNOSIS — Z87440 Personal history of urinary (tract) infections: Secondary | ICD-10-CM | POA: Diagnosis not present

## 2021-03-25 DIAGNOSIS — Z885 Allergy status to narcotic agent status: Secondary | ICD-10-CM | POA: Diagnosis not present

## 2021-03-25 DIAGNOSIS — S83282A Other tear of lateral meniscus, current injury, left knee, initial encounter: Secondary | ICD-10-CM | POA: Diagnosis present

## 2021-03-25 DIAGNOSIS — N390 Urinary tract infection, site not specified: Secondary | ICD-10-CM | POA: Diagnosis not present

## 2021-03-25 DIAGNOSIS — S83512A Sprain of anterior cruciate ligament of left knee, initial encounter: Secondary | ICD-10-CM

## 2021-03-25 DIAGNOSIS — W19XXXA Unspecified fall, initial encounter: Secondary | ICD-10-CM | POA: Diagnosis present

## 2021-03-25 DIAGNOSIS — I10 Essential (primary) hypertension: Secondary | ICD-10-CM | POA: Diagnosis present

## 2021-03-25 DIAGNOSIS — G5702 Lesion of sciatic nerve, left lower limb: Secondary | ICD-10-CM

## 2021-03-25 DIAGNOSIS — Z6841 Body Mass Index (BMI) 40.0 and over, adult: Secondary | ICD-10-CM | POA: Diagnosis not present

## 2021-03-25 DIAGNOSIS — Z419 Encounter for procedure for purposes other than remedying health state, unspecified: Secondary | ICD-10-CM

## 2021-03-25 DIAGNOSIS — I471 Supraventricular tachycardia: Secondary | ICD-10-CM | POA: Diagnosis not present

## 2021-03-25 DIAGNOSIS — Z8249 Family history of ischemic heart disease and other diseases of the circulatory system: Secondary | ICD-10-CM | POA: Diagnosis not present

## 2021-03-25 DIAGNOSIS — D62 Acute posthemorrhagic anemia: Secondary | ICD-10-CM | POA: Diagnosis not present

## 2021-03-25 DIAGNOSIS — S83282D Other tear of lateral meniscus, current injury, left knee, subsequent encounter: Secondary | ICD-10-CM

## 2021-03-25 DIAGNOSIS — S83105A Unspecified dislocation of left knee, initial encounter: Secondary | ICD-10-CM | POA: Diagnosis present

## 2021-03-25 DIAGNOSIS — S83522D Sprain of posterior cruciate ligament of left knee, subsequent encounter: Secondary | ICD-10-CM

## 2021-03-25 DIAGNOSIS — Z9889 Other specified postprocedural states: Secondary | ICD-10-CM

## 2021-03-25 DIAGNOSIS — F32A Depression, unspecified: Secondary | ICD-10-CM | POA: Diagnosis present

## 2021-03-25 DIAGNOSIS — S83422A Sprain of lateral collateral ligament of left knee, initial encounter: Secondary | ICD-10-CM | POA: Diagnosis present

## 2021-03-25 DIAGNOSIS — Z79899 Other long term (current) drug therapy: Secondary | ICD-10-CM | POA: Diagnosis not present

## 2021-03-25 DIAGNOSIS — S83512D Sprain of anterior cruciate ligament of left knee, subsequent encounter: Secondary | ICD-10-CM

## 2021-03-25 DIAGNOSIS — S83522A Sprain of posterior cruciate ligament of left knee, initial encounter: Secondary | ICD-10-CM | POA: Diagnosis present

## 2021-03-25 DIAGNOSIS — Z01818 Encounter for other preprocedural examination: Secondary | ICD-10-CM

## 2021-03-25 HISTORY — PX: KNEE ARTHROSCOPY WITH ANTERIOR CRUCIATE LIGAMENT (ACL) REPAIR WITH HAMSTRING GRAFT: SHX5645

## 2021-03-25 LAB — POCT PREGNANCY, URINE: Preg Test, Ur: NEGATIVE

## 2021-03-25 SURGERY — KNEE ARTHROSCOPY WITH ANTERIOR CRUCIATE LIGAMENT (ACL) REPAIR WITH HAMSTRING GRAFT
Anesthesia: Regional | Site: Knee | Laterality: Left

## 2021-03-25 MED ORDER — SUCCINYLCHOLINE CHLORIDE 200 MG/10ML IV SOSY
PREFILLED_SYRINGE | INTRAVENOUS | Status: AC
  Filled 2021-03-25: qty 10

## 2021-03-25 MED ORDER — ONDANSETRON HCL 4 MG/2ML IJ SOLN
INTRAMUSCULAR | Status: DC | PRN
  Administered 2021-03-25: 4 mg via INTRAVENOUS

## 2021-03-25 MED ORDER — CLONIDINE HCL (ANALGESIA) 100 MCG/ML EP SOLN
EPIDURAL | Status: AC
  Filled 2021-03-25: qty 10

## 2021-03-25 MED ORDER — EPINEPHRINE PF 1 MG/ML IJ SOLN
INTRAMUSCULAR | Status: AC
  Filled 2021-03-25: qty 1

## 2021-03-25 MED ORDER — LIDOCAINE 2% (20 MG/ML) 5 ML SYRINGE
INTRAMUSCULAR | Status: DC | PRN
  Administered 2021-03-25: 60 mg via INTRAVENOUS

## 2021-03-25 MED ORDER — METHOCARBAMOL 500 MG PO TABS
500.0000 mg | ORAL_TABLET | Freq: Four times a day (QID) | ORAL | Status: DC | PRN
  Administered 2021-03-26 – 2021-04-03 (×8): 500 mg via ORAL
  Filled 2021-03-25 (×9): qty 1

## 2021-03-25 MED ORDER — ACETAMINOPHEN 500 MG PO TABS
1000.0000 mg | ORAL_TABLET | Freq: Four times a day (QID) | ORAL | Status: AC
  Administered 2021-03-26 (×4): 1000 mg via ORAL
  Filled 2021-03-25 (×4): qty 2

## 2021-03-25 MED ORDER — CEFAZOLIN SODIUM 1 G IJ SOLR
INTRAMUSCULAR | Status: AC
  Filled 2021-03-25: qty 20

## 2021-03-25 MED ORDER — PROPOFOL 10 MG/ML IV BOLUS
INTRAVENOUS | Status: DC | PRN
  Administered 2021-03-25: 50 mg via INTRAVENOUS
  Administered 2021-03-25 (×2): 30 mg via INTRAVENOUS
  Administered 2021-03-25: 250 mg via INTRAVENOUS

## 2021-03-25 MED ORDER — CHLORHEXIDINE GLUCONATE CLOTH 2 % EX PADS
6.0000 | MEDICATED_PAD | Freq: Every day | CUTANEOUS | Status: DC
  Administered 2021-03-26 – 2021-04-05 (×10): 6 via TOPICAL

## 2021-03-25 MED ORDER — BUPIVACAINE-EPINEPHRINE (PF) 0.25% -1:200000 IJ SOLN
INTRAMUSCULAR | Status: DC | PRN
  Administered 2021-03-25: 30 mL

## 2021-03-25 MED ORDER — PROPOFOL 10 MG/ML IV BOLUS
INTRAVENOUS | Status: AC
  Filled 2021-03-25: qty 20

## 2021-03-25 MED ORDER — ACETAMINOPHEN 10 MG/ML IV SOLN
1000.0000 mg | Freq: Once | INTRAVENOUS | Status: DC | PRN
  Administered 2021-03-25: 1000 mg via INTRAVENOUS

## 2021-03-25 MED ORDER — METOCLOPRAMIDE HCL 5 MG PO TABS: 5.0000 mg | ORAL_TABLET | Freq: Three times a day (TID) | ORAL | Status: DC | PRN

## 2021-03-25 MED ORDER — BUPIVACAINE HCL (PF) 0.25 % IJ SOLN
INTRAMUSCULAR | Status: AC
  Filled 2021-03-25: qty 30

## 2021-03-25 MED ORDER — OXYCODONE HCL 5 MG PO TABS
5.0000 mg | ORAL_TABLET | ORAL | Status: DC | PRN
  Administered 2021-03-25 – 2021-03-26 (×3): 10 mg via ORAL
  Administered 2021-03-26: 5 mg via ORAL
  Administered 2021-03-26: 10 mg via ORAL
  Administered 2021-03-27: 09:00:00 5 mg via ORAL
  Administered 2021-03-27 (×2): 10 mg via ORAL
  Administered 2021-03-27: 18:00:00 5 mg via ORAL
  Administered 2021-03-28 (×2): 10 mg via ORAL
  Administered 2021-03-28: 10:00:00 5 mg via ORAL
  Administered 2021-03-28 – 2021-03-31 (×7): 10 mg via ORAL
  Administered 2021-04-01 (×2): 5 mg via ORAL
  Filled 2021-03-25: qty 1
  Filled 2021-03-25 (×11): qty 2
  Filled 2021-03-25: qty 1
  Filled 2021-03-25 (×7): qty 2

## 2021-03-25 MED ORDER — LIDOCAINE 2% (20 MG/ML) 5 ML SYRINGE
INTRAMUSCULAR | Status: AC
  Filled 2021-03-25: qty 5

## 2021-03-25 MED ORDER — DEXAMETHASONE SODIUM PHOSPHATE 10 MG/ML IJ SOLN
INTRAMUSCULAR | Status: AC
  Filled 2021-03-25: qty 1

## 2021-03-25 MED ORDER — MIDAZOLAM HCL 2 MG/2ML IJ SOLN
INTRAMUSCULAR | Status: AC
  Filled 2021-03-25: qty 2

## 2021-03-25 MED ORDER — MIDAZOLAM HCL 5 MG/5ML IJ SOLN
INTRAMUSCULAR | Status: DC | PRN
  Administered 2021-03-25: 2 mg via INTRAVENOUS

## 2021-03-25 MED ORDER — FENTANYL CITRATE (PF) 250 MCG/5ML IJ SOLN
INTRAMUSCULAR | Status: AC
  Filled 2021-03-25: qty 5

## 2021-03-25 MED ORDER — CELECOXIB 200 MG PO CAPS
200.0000 mg | ORAL_CAPSULE | Freq: Two times a day (BID) | ORAL | Status: DC
  Administered 2021-03-25 – 2021-04-05 (×22): 200 mg via ORAL
  Filled 2021-03-25 (×22): qty 1

## 2021-03-25 MED ORDER — CEFAZOLIN SODIUM-DEXTROSE 2-4 GM/100ML-% IV SOLN
2.0000 g | Freq: Three times a day (TID) | INTRAVENOUS | Status: AC
  Administered 2021-03-25 – 2021-03-27 (×6): 2 g via INTRAVENOUS
  Filled 2021-03-25 (×6): qty 100

## 2021-03-25 MED ORDER — BUPIVACAINE LIPOSOME 1.3 % IJ SUSP
INTRAMUSCULAR | Status: DC | PRN
  Administered 2021-03-25: 10 mL via PERINEURAL

## 2021-03-25 MED ORDER — HYDROMORPHONE HCL 1 MG/ML IJ SOLN
0.5000 mg | INTRAMUSCULAR | Status: DC | PRN
  Administered 2021-03-25 – 2021-03-26 (×2): 1 mg via INTRAVENOUS
  Filled 2021-03-25 (×3): qty 1

## 2021-03-25 MED ORDER — BUPIVACAINE HCL (PF) 0.5 % IJ SOLN
INTRAMUSCULAR | Status: DC | PRN
  Administered 2021-03-25: 20 mL via PERINEURAL

## 2021-03-25 MED ORDER — FENTANYL CITRATE (PF) 100 MCG/2ML IJ SOLN
INTRAMUSCULAR | Status: AC
  Filled 2021-03-25: qty 2

## 2021-03-25 MED ORDER — VANCOMYCIN HCL 1 G IV SOLR
INTRAVENOUS | Status: DC | PRN
  Administered 2021-03-25 (×2): 1000 mg

## 2021-03-25 MED ORDER — 0.9 % SODIUM CHLORIDE (POUR BTL) OPTIME
TOPICAL | Status: DC | PRN
  Administered 2021-03-25: 1000 mL

## 2021-03-25 MED ORDER — FENTANYL CITRATE (PF) 100 MCG/2ML IJ SOLN
25.0000 ug | INTRAMUSCULAR | Status: DC | PRN
  Administered 2021-03-25: 50 ug via INTRAVENOUS
  Administered 2021-03-25 (×2): 25 ug via INTRAVENOUS
  Administered 2021-03-25: 50 ug via INTRAVENOUS

## 2021-03-25 MED ORDER — VANCOMYCIN HCL 1000 MG IV SOLR
INTRAVENOUS | Status: AC
  Filled 2021-03-25: qty 20

## 2021-03-25 MED ORDER — POVIDONE-IODINE 10 % EX SWAB
2.0000 "application " | Freq: Once | CUTANEOUS | Status: AC
  Administered 2021-03-25: 2 via TOPICAL

## 2021-03-25 MED ORDER — DOCUSATE SODIUM 100 MG PO CAPS
100.0000 mg | ORAL_CAPSULE | Freq: Two times a day (BID) | ORAL | Status: DC
  Administered 2021-03-25 – 2021-04-03 (×17): 100 mg via ORAL
  Filled 2021-03-25 (×21): qty 1

## 2021-03-25 MED ORDER — METOPROLOL TARTRATE 5 MG/5ML IV SOLN
INTRAVENOUS | Status: AC
  Filled 2021-03-25: qty 5

## 2021-03-25 MED ORDER — ROCURONIUM BROMIDE 10 MG/ML (PF) SYRINGE
PREFILLED_SYRINGE | INTRAVENOUS | Status: DC | PRN
  Administered 2021-03-25 (×2): 20 mg via INTRAVENOUS
  Administered 2021-03-25: 30 mg via INTRAVENOUS
  Administered 2021-03-25: 50 mg via INTRAVENOUS
  Administered 2021-03-25: 20 mg via INTRAVENOUS

## 2021-03-25 MED ORDER — POVIDONE-IODINE 10 % EX SWAB: 2.0000 "application " | Freq: Once | CUTANEOUS | Status: DC

## 2021-03-25 MED ORDER — ONDANSETRON HCL 4 MG/2ML IJ SOLN
INTRAMUSCULAR | Status: AC
  Filled 2021-03-25: qty 2

## 2021-03-25 MED ORDER — ONDANSETRON HCL 4 MG/2ML IJ SOLN: 4.0000 mg | Freq: Four times a day (QID) | INTRAMUSCULAR | Status: DC | PRN

## 2021-03-25 MED ORDER — ORAL CARE MOUTH RINSE: 15.0000 mL | Freq: Once | OROMUCOSAL | Status: AC

## 2021-03-25 MED ORDER — TRANEXAMIC ACID-NACL 1000-0.7 MG/100ML-% IV SOLN
INTRAVENOUS | Status: AC
  Filled 2021-03-25: qty 100

## 2021-03-25 MED ORDER — SUCCINYLCHOLINE CHLORIDE 200 MG/10ML IV SOSY
PREFILLED_SYRINGE | INTRAVENOUS | Status: DC | PRN
  Administered 2021-03-25: 180 mg via INTRAVENOUS

## 2021-03-25 MED ORDER — EPINEPHRINE PF 1 MG/ML IJ SOLN
INTRAMUSCULAR | Status: AC
  Filled 2021-03-25: qty 3

## 2021-03-25 MED ORDER — ESMOLOL HCL 100 MG/10ML IV SOLN
INTRAVENOUS | Status: DC | PRN
  Administered 2021-03-25 (×2): 20 mg via INTRAVENOUS

## 2021-03-25 MED ORDER — BUPIVACAINE-EPINEPHRINE (PF) 0.25% -1:200000 IJ SOLN
INTRAMUSCULAR | Status: AC
  Filled 2021-03-25: qty 30

## 2021-03-25 MED ORDER — EPINEPHRINE PF 1 MG/ML IJ SOLN
INTRAMUSCULAR | Status: DC | PRN
  Administered 2021-03-25 (×4): 1 mg via INTRAMUSCULAR

## 2021-03-25 MED ORDER — ONDANSETRON HCL 4 MG PO TABS: 4.0000 mg | ORAL_TABLET | Freq: Four times a day (QID) | ORAL | Status: DC | PRN

## 2021-03-25 MED ORDER — DEXMEDETOMIDINE (PRECEDEX) IN NS 20 MCG/5ML (4 MCG/ML) IV SYRINGE
PREFILLED_SYRINGE | INTRAVENOUS | Status: DC | PRN
  Administered 2021-03-25 (×3): 8 ug via INTRAVENOUS
  Administered 2021-03-25: 4 ug via INTRAVENOUS
  Administered 2021-03-25: 12 ug via INTRAVENOUS

## 2021-03-25 MED ORDER — ACETAMINOPHEN 325 MG PO TABS
325.0000 mg | ORAL_TABLET | Freq: Four times a day (QID) | ORAL | Status: DC | PRN
  Administered 2021-03-27 – 2021-04-04 (×9): 650 mg via ORAL
  Filled 2021-03-25 (×4): qty 2
  Filled 2021-03-25: qty 1
  Filled 2021-03-25 (×5): qty 2

## 2021-03-25 MED ORDER — ROCURONIUM BROMIDE 10 MG/ML (PF) SYRINGE
PREFILLED_SYRINGE | INTRAVENOUS | Status: AC
  Filled 2021-03-25: qty 10

## 2021-03-25 MED ORDER — ESMOLOL HCL 100 MG/10ML IV SOLN
INTRAVENOUS | Status: AC
  Filled 2021-03-25: qty 10

## 2021-03-25 MED ORDER — SODIUM CHLORIDE 0.9 % IR SOLN
Status: DC | PRN
  Administered 2021-03-25: 3000 mL
  Administered 2021-03-25: 6000 mL
  Administered 2021-03-25 (×13): 3000 mL

## 2021-03-25 MED ORDER — FENTANYL CITRATE (PF) 250 MCG/5ML IJ SOLN
INTRAMUSCULAR | Status: DC | PRN
  Administered 2021-03-25 (×10): 50 ug via INTRAVENOUS

## 2021-03-25 MED ORDER — OXYCODONE HCL 5 MG PO TABS
ORAL_TABLET | ORAL | Status: AC
  Filled 2021-03-25: qty 2

## 2021-03-25 MED ORDER — BUPIVACAINE HCL (PF) 0.25 % IJ SOLN
INTRAMUSCULAR | Status: DC | PRN
  Administered 2021-03-25: 30 mL

## 2021-03-25 MED ORDER — METOCLOPRAMIDE HCL 5 MG/ML IJ SOLN: 5.0000 mg | Freq: Three times a day (TID) | INTRAMUSCULAR | Status: DC | PRN

## 2021-03-25 MED ORDER — CHLORHEXIDINE GLUCONATE 0.12 % MT SOLN
15.0000 mL | Freq: Once | OROMUCOSAL | Status: AC
  Administered 2021-03-25: 15 mL via OROMUCOSAL
  Filled 2021-03-25: qty 15

## 2021-03-25 MED ORDER — CLONIDINE HCL (ANALGESIA) 100 MCG/ML EP SOLN
EPIDURAL | Status: DC | PRN
  Administered 2021-03-25: 100 ug

## 2021-03-25 MED ORDER — METHOCARBAMOL 1000 MG/10ML IJ SOLN
500.0000 mg | Freq: Four times a day (QID) | INTRAVENOUS | Status: DC | PRN
  Filled 2021-03-25: qty 5

## 2021-03-25 MED ORDER — LACTATED RINGERS IV SOLN: INTRAVENOUS | Status: AC

## 2021-03-25 MED ORDER — CEFAZOLIN IN SODIUM CHLORIDE 3-0.9 GM/100ML-% IV SOLN
3.0000 g | INTRAVENOUS | Status: AC
  Administered 2021-03-25: 3 g via INTRAVENOUS
  Administered 2021-03-25 (×2): 2 g via INTRAVENOUS
  Filled 2021-03-25: qty 100

## 2021-03-25 MED ORDER — TRANEXAMIC ACID-NACL 1000-0.7 MG/100ML-% IV SOLN
INTRAVENOUS | Status: DC | PRN
  Administered 2021-03-25: 1000 mg via INTRAVENOUS

## 2021-03-25 MED ORDER — LACTATED RINGERS IV SOLN: INTRAVENOUS | Status: DC

## 2021-03-25 MED ORDER — LACTATED RINGERS IV SOLN: INTRAVENOUS | Status: DC | PRN

## 2021-03-25 MED ORDER — DEXAMETHASONE SODIUM PHOSPHATE 10 MG/ML IJ SOLN
INTRAMUSCULAR | Status: DC | PRN
  Administered 2021-03-25: 10 mg via INTRAVENOUS

## 2021-03-25 MED ORDER — ACETAMINOPHEN 10 MG/ML IV SOLN
INTRAVENOUS | Status: AC
  Filled 2021-03-25: qty 100

## 2021-03-25 MED ORDER — PROMETHAZINE HCL 25 MG/ML IJ SOLN: 6.2500 mg | INTRAMUSCULAR | Status: DC | PRN

## 2021-03-25 MED ORDER — POVIDONE-IODINE 7.5 % EX SOLN: Freq: Once | CUTANEOUS | Status: DC

## 2021-03-25 MED ORDER — METOPROLOL TARTRATE 5 MG/5ML IV SOLN
INTRAVENOUS | Status: DC | PRN
  Administered 2021-03-25 (×3): 1 mg via INTRAVENOUS

## 2021-03-25 SURGICAL SUPPLY — 131 items
ANCH SUT SWLK 19.1X4.75 VT (Anchor) ×3 IMPLANT
ANCH SUT SWLK 19.1X5.5 CLS EL (Anchor) ×1 IMPLANT
ANCHOR BUTTON TIGHTROPE II FT (Anchor) ×3 IMPLANT
ANCHOR BUTTON TIGHTROPE II OP (Anchor) ×1 IMPLANT
ANCHOR BUTTON TIGHTROPE RN 14 (Anchor) ×1 IMPLANT
ANCHOR PEEK 4.75X19.1 SWLK C (Anchor) ×3 IMPLANT
ANCHOR PEEK SWIVEL LOCK 5.5 (Anchor) ×1 IMPLANT
BAG COUNTER SPONGE SURGICOUNT (BAG) IMPLANT
BAG SPNG CNTER NS LX DISP (BAG)
BANDAGE ESMARK 6X9 LF (GAUZE/BANDAGES/DRESSINGS) ×1 IMPLANT
BLADE EXCALIBUR 4.0X13 (MISCELLANEOUS) ×3 IMPLANT
BLADE SURG 10 STRL SS (BLADE) ×2 IMPLANT
BLADE SURG 15 STRL LF DISP TIS (BLADE) ×2 IMPLANT
BLADE SURG 15 STRL SS (BLADE) ×4
BNDG CMPR 9X6 STRL LF SNTH (GAUZE/BANDAGES/DRESSINGS) ×1
BNDG CMPR MED 15X6 ELC VLCR LF (GAUZE/BANDAGES/DRESSINGS) ×1
BNDG ELASTIC 6X15 VLCR STRL LF (GAUZE/BANDAGES/DRESSINGS) ×2 IMPLANT
BNDG ESMARK 6X9 LF (GAUZE/BANDAGES/DRESSINGS) ×2
BURR OVAL 8 FLU 4.0X13 (MISCELLANEOUS) ×1 IMPLANT
CANNULA 5.75X71 LONG (CANNULA) ×1 IMPLANT
CANNULA TWIST IN 8.25X9CM (CANNULA) ×1 IMPLANT
CLSR STERI-STRIP ANTIMIC 1/2X4 (GAUZE/BANDAGES/DRESSINGS) ×3 IMPLANT
COVER SURGICAL LIGHT HANDLE (MISCELLANEOUS) ×2 IMPLANT
CUFF TOURN SGL QUICK 34 (TOURNIQUET CUFF)
CUFF TOURN SGL QUICK 42 (TOURNIQUET CUFF) IMPLANT
CUFF TRNQT CYL 34X4.125X (TOURNIQUET CUFF) IMPLANT
CUTTER BONE 4.0MM X 13CM (MISCELLANEOUS) ×2 IMPLANT
DECANTER SPIKE VIAL GLASS SM (MISCELLANEOUS) ×2 IMPLANT
DRAPE ARTHROSCOPY W/POUCH 114 (DRAPES) ×2 IMPLANT
DRAPE C-ARM 42X120 X-RAY (DRAPES) ×2 IMPLANT
DRAPE C-ARMOR (DRAPES) ×1 IMPLANT
DRAPE INCISE 23X17 IOBAN STRL (DRAPES) ×1
DRAPE INCISE 23X17 STRL (DRAPES) IMPLANT
DRAPE INCISE IOBAN 23X17 STRL (DRAPES) ×1 IMPLANT
DRAPE INCISE IOBAN 66X45 STRL (DRAPES) ×2 IMPLANT
DRAPE OEC MINIVIEW 54X84 (DRAPES) IMPLANT
DRAPE ORTHO SPLIT 77X108 STRL (DRAPES) ×2
DRAPE SURG ORHT 6 SPLT 77X108 (DRAPES) ×1 IMPLANT
DRAPE U-SHAPE 47X51 STRL (DRAPES) ×2 IMPLANT
DRESSING PEEL AND PLAC PRVNA20 (GAUZE/BANDAGES/DRESSINGS) IMPLANT
DRILL FLIPCUTTER III 6-12 (ORTHOPEDIC DISPOSABLE SUPPLIES) IMPLANT
DRSG PAD ABDOMINAL 8X10 ST (GAUZE/BANDAGES/DRESSINGS) ×6 IMPLANT
DRSG PEEL AND PLACE PREVENA 20 (GAUZE/BANDAGES/DRESSINGS) ×2
DRSG TEGADERM 4X4.5 CHG (GAUZE/BANDAGES/DRESSINGS) ×6 IMPLANT
DW OUTFLOW CASSETTE/TUBE SET (MISCELLANEOUS) ×2 IMPLANT
ELECT REM PT RETURN 9FT ADLT (ELECTROSURGICAL) ×2
ELECTRODE REM PT RTRN 9FT ADLT (ELECTROSURGICAL) ×1 IMPLANT
FIBERLOOP 2 0 (SUTURE) ×1 IMPLANT
FIBERSTICK 2 (SUTURE) ×4 IMPLANT
FLIPCUTTER III 6-12 AR-1204FF (ORTHOPEDIC DISPOSABLE SUPPLIES) ×2
GAUZE SPONGE 4X4 12PLY STRL (GAUZE/BANDAGES/DRESSINGS) ×3 IMPLANT
GAUZE XEROFORM 1X8 LF (GAUZE/BANDAGES/DRESSINGS) ×2 IMPLANT
GAUZE XEROFORM 5X9 LF (GAUZE/BANDAGES/DRESSINGS) ×1 IMPLANT
GLOVE SRG 8 PF TXTR STRL LF DI (GLOVE) ×1 IMPLANT
GLOVE SURG ENC MOIS LTX SZ7 (GLOVE) ×4 IMPLANT
GLOVE SURG LTX SZ8 (GLOVE) ×2 IMPLANT
GLOVE SURG UNDER LTX SZ7 (GLOVE) ×2 IMPLANT
GLOVE SURG UNDER POLY LF SZ8 (GLOVE) ×2
GOWN STRL REUS W/ TWL LRG LVL3 (GOWN DISPOSABLE) ×3 IMPLANT
GOWN STRL REUS W/ TWL XL LVL3 (GOWN DISPOSABLE) ×1 IMPLANT
GOWN STRL REUS W/TWL LRG LVL3 (GOWN DISPOSABLE) ×6
GOWN STRL REUS W/TWL XL LVL3 (GOWN DISPOSABLE) ×2
GRAFT TISS 230-320 GRACILIS (Bone Implant) IMPLANT
GRAFT TISS 65-80 FRZN TENDON (Tissue) IMPLANT
IMP SYS 2ND FIX PEEK 4.75X19.1 (Miscellaneous) ×2 IMPLANT
IMPL ARTHROCELL 2.5 (Tissue) IMPLANT
IMPL SYS 2ND FX PEEK 4.75X19.1 (Miscellaneous) IMPLANT
IMPLANT ARTHROCELL 2.5 (Tissue) ×2 IMPLANT
KIT BASIN OR (CUSTOM PROCEDURE TRAY) ×2 IMPLANT
KIT BIOCARTILAGE LG JOINT MIX (KITS) ×3 IMPLANT
KIT GRAFT DELIVERY BIOEXPRESS (KITS) ×1 IMPLANT
KIT ROOT REPAIR MEINISCAL PEEK (Anchor) IMPLANT
KIT TRANSTIBIAL (DISPOSABLE) ×1 IMPLANT
KIT TURNOVER KIT B (KITS) ×2 IMPLANT
LOOP 2 FIBERLINK CLOSED (SUTURE) ×2 IMPLANT
LOOP VESSEL MAXI BLUE (MISCELLANEOUS) ×1 IMPLANT
MANIFOLD NEPTUNE II (INSTRUMENTS) ×2 IMPLANT
MARKER SKIN DUAL TIP RULER LAB (MISCELLANEOUS) ×1 IMPLANT
MEINISCAL ROOT REPAIR KIT PEEK (Anchor) ×2 IMPLANT
NDL 1/2 CIR CATGUT .05X1.09 (NEEDLE) IMPLANT
NDL 18GX1X1/2 (RX/OR ONLY) (NEEDLE) ×1 IMPLANT
NDL SUT 2-0 SCORPION KNEE (NEEDLE) IMPLANT
NEEDLE 1/2 CIR CATGUT .05X1.09 (NEEDLE) ×2 IMPLANT
NEEDLE 18GX1X1/2 (RX/OR ONLY) (NEEDLE) ×2 IMPLANT
NEEDLE SUT 2-0 SCORPION KNEE (NEEDLE) ×2 IMPLANT
NS IRRIG 1000ML POUR BTL (IV SOLUTION) ×2 IMPLANT
PACK ARTHROSCOPY DSU (CUSTOM PROCEDURE TRAY) ×2 IMPLANT
PAD ARMBOARD 7.5X6 YLW CONV (MISCELLANEOUS) ×4 IMPLANT
PAD CAST 4YDX4 CTTN HI CHSV (CAST SUPPLIES) ×1 IMPLANT
PAD COLD SHLDR WRAP-ON (PAD) ×9 IMPLANT
PADDING CAST COTTON 4X4 STRL (CAST SUPPLIES) ×2
PADDING CAST COTTON 6X4 STRL (CAST SUPPLIES) ×6 IMPLANT
PENCIL BUTTON HOLSTER BLD 10FT (ELECTRODE) ×1 IMPLANT
PUTTY DBM ALLOSYNC PURE 1CC (Putty) ×3 IMPLANT
SCREW BIOCOMPOSITE 8X20 INTER (Screw) ×1 IMPLANT
SPONGE T-LAP 4X18 ~~LOC~~+RFID (SPONGE) ×4 IMPLANT
SUCTION FRAZIER HANDLE 10FR (MISCELLANEOUS) ×1
SUCTION TUBE FRAZIER 10FR DISP (MISCELLANEOUS) ×1 IMPLANT
SUT 2 FIBERLOOP 20 STRT BLUE (SUTURE)
SUT ETHILON 3 0 PS 1 (SUTURE) ×4 IMPLANT
SUT FIBERWIRE #2 38 T-5 BLUE (SUTURE)
SUT MNCRL AB 4-0 PS2 18 (SUTURE) ×4 IMPLANT
SUT SILK 2 0 TIES 10X30 (SUTURE) ×1 IMPLANT
SUT VIC AB 0 CT1 27 (SUTURE) ×12
SUT VIC AB 0 CT1 27XBRD ANBCTR (SUTURE) ×1 IMPLANT
SUT VIC AB 1 CT1 27 (SUTURE) ×6
SUT VIC AB 1 CT1 27XBRD ANBCTR (SUTURE) IMPLANT
SUT VIC AB 2-0 CT1 27 (SUTURE) ×10
SUT VIC AB 2-0 CT1 TAPERPNT 27 (SUTURE) ×1 IMPLANT
SUT VICRYL 0 AB UR-6 (SUTURE) ×6 IMPLANT
SUTURE 2 FIBERLOOP 20 STRT BLU (SUTURE) IMPLANT
SUTURE FIBERWR #2 38 T-5 BLUE (SUTURE) IMPLANT
SUTURE TAPE 1.3 40 TPR END (SUTURE) IMPLANT
SUTURE TAPE 1.3 FIBERLOP 20 ST (SUTURE) IMPLANT
SUTURE TAPE TIGERLINK 1.3MM BL (SUTURE) IMPLANT
SUTURETAPE 1.3 40 TPR END (SUTURE) ×6
SUTURETAPE 1.3 FIBERLOOP 20 ST (SUTURE) ×4
SUTURETAPE TIGERLINK 1.3MM BL (SUTURE) ×4
SYR 30ML LL (SYRINGE) ×2 IMPLANT
SYR BULB IRRIG 60ML STRL (SYRINGE) ×2 IMPLANT
SYR TB 1ML LUER SLIP (SYRINGE) ×2 IMPLANT
TENDON GRACILIS FROZEN (Bone Implant) ×2 IMPLANT
TENDON GRACILIS FROZEN 230-320 (Bone Implant) ×1 IMPLANT
TISSUE GRAFTLINK 65-95MML (Tissue) ×2 IMPLANT
TISSUE TENDON PILOTGRAFT BTB (Tissue) ×1 IMPLANT
TOWEL GREEN STERILE (TOWEL DISPOSABLE) ×4 IMPLANT
TOWEL GREEN STERILE FF (TOWEL DISPOSABLE) ×2 IMPLANT
TUBING ARTHROSCOPY IRRIG 16FT (MISCELLANEOUS) ×2 IMPLANT
UNDERPAD 30X36 HEAVY ABSORB (UNDERPADS AND DIAPERS) ×2 IMPLANT
WAND APOLLO RF 50D ABLATOR (BUR) ×1 IMPLANT
WATER STERILE IRR 1000ML POUR (IV SOLUTION) ×2 IMPLANT

## 2021-03-25 NOTE — Transfer of Care (Signed)
Immediate Anesthesia Transfer of Care Note  Patient: Meagan Oneill  Procedure(s) Performed: left knee anterior cruciate ligament, posterior cruciate ligment, posterolateral corner reconstruction arthroscopy; allograft ligaments for reconstruction, meniscal debridement vs repair (Left: Knee)  Patient Location: PACU  Anesthesia Type:General  Level of Consciousness: oriented, drowsy and patient cooperative  Airway & Oxygen Therapy: Patient Spontanous Breathing and Patient connected to face mask oxygen  Post-op Assessment: Report given to RN and Post -op Vital signs reviewed and stable  Post vital signs: Reviewed  Last Vitals:  Vitals Value Taken Time  BP 131/88 03/25/21 1633  Temp 36.8 C 03/25/21 1633  Pulse 106 03/25/21 1638  Resp 14 03/25/21 1638  SpO2 100 % 03/25/21 1638  Vitals shown include unvalidated device data.  Last Pain:  Vitals:   03/25/21 0620  TempSrc: Oral  PainSc:          Complications: No notable events documented.

## 2021-03-25 NOTE — Anesthesia Procedure Notes (Signed)
Anesthesia Regional Block: Adductor canal block   Pre-Anesthetic Checklist: , timeout performed,  Correct Patient, Correct Site, Correct Laterality,  Correct Procedure, Correct Position, site marked,  Risks and benefits discussed,  Surgical consent,  Pre-op evaluation,  At surgeon's request and post-op pain management  Laterality: Left  Prep: Dura Prep       Needles:  Injection technique: Single-shot  Needle Type: Echogenic Stimulator Needle     Needle Length: 10cm  Needle Gauge: 20     Additional Needles:   Procedures:,,,, ultrasound used (permanent image in chart),,    Narrative:  Start time: 03/25/2021 6:58 AM End time: 03/25/2021 7:03 AM Injection made incrementally with aspirations every 5 mL.  Performed by: Personally  Anesthesiologist: Atilano Median, DO  Additional Notes: Patient identified. Risks/Benefits/Options discussed with patient including but not limited to bleeding, infection, nerve damage, failed block, incomplete pain control. Patient expressed understanding and wished to proceed. All questions were answered. Sterile technique was used throughout the entire procedure. Please see nursing notes for vital signs. Aspirated in 5cc intervals with injection for negative confirmation. Patient was given instructions on fall risk and not to get out of bed. All questions and concerns addressed with instructions to call with any issues or inadequate analgesia.

## 2021-03-25 NOTE — Anesthesia Procedure Notes (Addendum)
Procedure Name: Intubation Date/Time: 03/25/2021 7:32 AM Performed by: Lovie Chol, CRNA Pre-anesthesia Checklist: Patient identified, Emergency Drugs available, Suction available and Patient being monitored Patient Re-evaluated:Patient Re-evaluated prior to induction Oxygen Delivery Method: Circle system utilized Preoxygenation: Pre-oxygenation with 100% oxygen Induction Type: IV induction Ventilation: Mask ventilation without difficulty Laryngoscope Size: Miller and 3 Grade View: Grade I Tube type: Oral Tube size: 7.5 mm Number of attempts: 1 Airway Equipment and Method: Stylet Placement Confirmation: ETT inserted through vocal cords under direct vision, positive ETCO2 and breath sounds checked- equal and bilateral Secured at: 22 cm Tube secured with: Tape Dental Injury: Teeth and Oropharynx as per pre-operative assessment

## 2021-03-25 NOTE — Brief Op Note (Signed)
° °  03/25/2021  5:32 PM  PATIENT:  Meagan Oneill  30 y.o. female  PRE-OPERATIVE DIAGNOSIS:  left knee dislocation  POST-OPERATIVE DIAGNOSIS:  left knee dislocation  PROCEDURE:  Procedure(s): left knee anterior cruciate ligament, posterior cruciate ligment, posterolateral corner reconstruction of popliteal fibular ligament and lateral collateral ligament, arthroscopy; allograft ligaments for reconstruction, lateral meniscal root repair  SURGEON:  Surgeon(s): August Saucer, Corrie Mckusick, MD  ASSISTANT: Karenann Cai, PA  ANESTHESIA:   general  EBL: 75 ml    Total I/O In: 2820 [I.V.:2700; IV Piggyback:120] Out: 1075 [Urine:1000; Blood:75]  BLOOD ADMINISTERED: none  DRAINS: none   LOCAL MEDICATIONS USED: Marcaine morphine clonidine vancomycin powder  SPECIMEN:  No Specimen  COUNTS:  YES  TOURNIQUET:  * Missing tourniquet times found for documented tourniquets in log: [id number] *approximately 95 minutes at 325 mmHg  DICTATION: .Other Dictation: Dictation Number [id number]  PLAN OF CARE: Admit to inpatient   PATIENT DISPOSITION:  PACU - hemodynamically stable

## 2021-03-25 NOTE — Progress Notes (Signed)
Pt has arrive to 5N08. Alert and oriented x 4, VS stable, no signs of acute distress.  Pt oriented to room and equipment, identified appropriately, instructed to use call bell for assistance, and call bell left within pt reach. Will continue to monitor pt and treat per MD orders.

## 2021-03-25 NOTE — H&P (Signed)
Meagan Oneill is an 30 y.o. female.   Chief Complaint: Left knee pain HPI: Meagan is a 30 year old female with left knee pain.  Approximately 6 weeks ago she sustained a low-energy knee dislocation.  MRI scan shows injury to the ACL with PCL femoral avulsion and lateral sided injury.  She has been working on regaining her range of motion.  Presents now for operative management of her multi ligament knee injury.  Past Medical History:  Diagnosis Date   Anemia    Asthma    Environmental allergies    Hypertension     Past Surgical History:  Procedure Laterality Date   NO PAST SURGERIES      Family History  Problem Relation Age of Onset   Hypertension Mother    Diabetes Mother    Hypertension Sister    Social History:  reports that she has never smoked. She has never used smokeless tobacco. She reports current alcohol use of about 1.0 standard drink per week. She reports that she does not use drugs.  Allergies:  Allergies  Allergen Reactions   Morphine And Related Hives    Medications Prior to Admission  Medication Sig Dispense Refill   acetaminophen (TYLENOL) 500 MG tablet Take 1,000 mg by mouth every 6 (six) hours as needed for moderate pain.     ibuprofen (ADVIL) 600 MG tablet Take 1 tablet (600 mg total) by mouth every 6 (six) hours as needed. (Patient not taking: Reported on 03/16/2021) 30 tablet 0   metroNIDAZOLE (FLAGYL) 500 MG tablet Take 1 tablet (500 mg total) by mouth 2 (two) times daily. (Patient not taking: Reported on 03/16/2021) 14 tablet 0   oxyCODONE-acetaminophen (PERCOCET/ROXICET) 5-325 MG tablet Take 1 tablet by mouth every 6 (six) hours as needed for severe pain. 20 tablet 0    Results for orders placed or performed during the hospital encounter of 03/25/21 (from the past 48 hour(s))  Pregnancy, urine POC     Status: None   Collection Time: 03/25/21  6:21 AM  Result Value Ref Range   Preg Test, Ur NEGATIVE NEGATIVE    Comment:        THE SENSITIVITY OF  THIS METHODOLOGY IS >24 mIU/mL    No results found.  Review of Systems  Musculoskeletal:  Positive for arthralgias.  All other systems reviewed and are negative.  Blood pressure 130/84, pulse 89, temperature 98.7 F (37.1 C), temperature source Oral, resp. rate 18, weight (!) 171.9 kg, last menstrual period 06/17/2020, SpO2 97 %. Physical Exam Vitals reviewed.  HENT:     Head: Normocephalic.     Nose: Nose normal.     Mouth/Throat:     Mouth: Mucous membranes are moist.  Eyes:     Pupils: Pupils are equal, round, and reactive to light.  Cardiovascular:     Rate and Rhythm: Normal rate.     Pulses: Normal pulses.  Pulmonary:     Effort: Pulmonary effort is normal.  Abdominal:     General: Abdomen is flat.  Musculoskeletal:     Cervical back: Normal range of motion.  Skin:    General: Skin is warm.     Capillary Refill: Capillary refill takes less than 2 seconds.  Neurological:     General: No focal deficit present.     Mental Status: She is alert.  Psychiatric:        Mood and Affect: Mood normal.  Examination of the left knee demonstrates lateral sided laxity.  Patient also has anterior  posterior laxity but stable MCL.  Examination is somewhat difficult due to the patient's body habitus.  Pedal pulses intact and ankle dorsiflexion intact  Assessment/Plan Impression is left knee low-energy dislocation and morbidly obese patient who is having continued knee instability.  She has regained full extension and flexion past 90 degrees.  No asymmetric hyperextension in either knee.  MRI scan shows ACL tear with PCL avulsion off the femoral side.  There is also lateral sided injury involving the lateral collateral ligament.  At this time the plan is for arthroscopy with ACL reconstruction and evaluation of the PCL.  Possible PCL reconstruction depending on stability and examination under anesthesia.  Patient will also require lateral sided reconstruction.  This will likely include  reconstruction of the fibular collateral ligament and popliteal fibular ligament.  Do not anticipate having to do transtibial limb of the posterior lateral corner reconstruction.  Meniscal pathology will also be addressed as needed.  The risk and benefits of the procedure discussed with the patient including not limited to knee stiffness continued instability possible infection.  Patient will need to be an inpatient due to her diminished mobility options after surgery.  Will likely need skilled nursing stay for 1 to 2 weeks until she is more functional.  Patient understands the risk and benefits and wishes to proceed.  Burnard Bunting, MD 03/25/2021, 7:09 AM

## 2021-03-26 LAB — BASIC METABOLIC PANEL
Anion gap: 7 (ref 5–15)
BUN: 8 mg/dL (ref 6–20)
CO2: 27 mmol/L (ref 22–32)
Calcium: 8.6 mg/dL — ABNORMAL LOW (ref 8.9–10.3)
Chloride: 104 mmol/L (ref 98–111)
Creatinine, Ser: 0.87 mg/dL (ref 0.44–1.00)
GFR, Estimated: >60 mL/min (ref >60)
Glucose, Bld: 123 mg/dL — ABNORMAL HIGH (ref 70–99)
Potassium: 3.8 mmol/L (ref 3.5–5.1)
Sodium: 138 mmol/L (ref 135–145)

## 2021-03-26 LAB — CBC
HCT: 34.5 % — ABNORMAL LOW (ref 36.0–46.0)
Hemoglobin: 10.3 g/dL — ABNORMAL LOW (ref 12.0–15.0)
MCH: 20.2 pg — ABNORMAL LOW (ref 26.0–34.0)
MCHC: 29.9 g/dL — ABNORMAL LOW (ref 30.0–36.0)
MCV: 67.8 fL — ABNORMAL LOW (ref 80.0–100.0)
Platelets: 130 10*3/uL — ABNORMAL LOW (ref 150–400)
RBC: 5.09 MIL/uL (ref 3.87–5.11)
RDW: 21.1 % — ABNORMAL HIGH (ref 11.5–15.5)
WBC: 13.3 10*3/uL — ABNORMAL HIGH (ref 4.0–10.5)
nRBC: 0 % (ref 0.0–0.2)

## 2021-03-26 MED ORDER — RIVAROXABAN 10 MG PO TABS
10.0000 mg | ORAL_TABLET | Freq: Every day | ORAL | Status: DC
  Administered 2021-03-26 – 2021-04-05 (×11): 10 mg via ORAL
  Filled 2021-03-26 (×11): qty 1

## 2021-03-26 NOTE — Progress Notes (Signed)
PT Cancellation Note  Patient Details Name: [place] Chevez MRN: [id number] DOB: 1991-01-27   Cancelled Treatment:    Reason Eval/Treat Not Completed: Patient not medically ready (Pt on strict bedrest for today, PT to start tomorrow per MD orders.  F/u in AM.)  Tyra Gural A. Nashid Pellum, PT, DPT Acute Rehabilitation Services Office: [phone]   Haseeb Fiallos A Tesa Meadors 03/26/2021, 8:07 AM

## 2021-03-26 NOTE — Progress Notes (Signed)
°  Subjective: Patient stable.  Pain controlled.   Objective: Vital signs in last 24 hours: Temp:  [97.4 F (36.3 C)-98.2 F (36.8 C)] 97.8 F (36.6 C) (12/17 0749) Pulse Rate:  [96-112] 100 (12/17 0749) Resp:  [12-20] 17 (12/17 0749) BP: (119-148)/(84-103) 120/86 (12/17 0749) SpO2:  [92 %-100 %] 97 % (12/17 0749)  Intake/Output from previous day: 12/16 0701 - 12/17 0700 In: 2820 [I.V.:2700; IV Piggyback:120] Out: 2400 [Urine:2325; Blood:75] Intake/Output this shift: No intake/output data recorded.  Exam:  Sensation intact distally Dorsiflexion/Plantar flexion intact Compartment soft  Labs: Recent Labs    03/26/21 0254  HGB 10.3*   Recent Labs    03/26/21 0254  WBC 13.3*  RBC 5.09  HCT 34.5*  PLT 130*   Recent Labs    03/26/21 0254  NA 138  K 3.8  CL 104  CO2 27  BUN 8  CREATININE 0.87  GLUCOSE 123*  CALCIUM 8.6*   No results for input(s): LABPT, INR in the last 72 hours.  Assessment/Plan: Plan at this time is okay to stand and pivot and transfer on the right leg.  Nonweightbearing on the left.  We will keep the knee immobilized and straight for the first week and then begin CPM machine.  Patient will need skilled nursing home placement.  Okay for pure wick.  Xarelto for DVT prophylaxis.  Patient will need bilateral lower extremity ultrasounds to rule out DVT prior to discharge   G Dorene Grebe 03/26/2021, 9:19 AM

## 2021-03-26 NOTE — Evaluation (Signed)
Physical Therapy Evaluation Patient Details Name: Meagan Oneill MRN: [id number] DOB: [birth year] Today's Date: 03/26/2021  History of Present Illness  Pt. is 30 yr old F admitted on 12/16 admitted for planned surgery to correct multiligament knee injury that occured 6 weeks ago.  Sx included L ACL, PCL, popliteal fibular ligament, lateral collateral ligament, and laternal meniscus root repair. PMH: anemia, asthma, HTN  Clinical Impression  Pt injured L knee 6 weeks ago and has been mod IND with functional mobility and use of RW since injury. After multiligament repair, pt is now requiring max/total A for bed mobility and is unable to sit EOB due to intense L knee pain.  Pt demos dec LE strength, dec activity tolerance, difficulty with bed mobility and will require skilled PT in acute care to address deficits indicated.  Pt is unsafe to return home at this time and would benefit from SNF placement upon D/C.       Recommendations for follow up therapy are one component of a multi-disciplinary discharge planning process, led by the attending physician.  Recommendations may be updated based on patient status, additional functional criteria and insurance authorization.  Follow Up Recommendations Skilled nursing-short term rehab (<3 hours/day)    Assistance Recommended at Discharge Frequent or constant Supervision/Assistance  Functional Status Assessment Patient has had a recent decline in their functional status and demonstrates the ability to make significant improvements in function in a reasonable and predictable amount of time.  Equipment Recommendations  Rolling walker (2 wheels);Wheelchair (measurements PT);BSC/3in1    Recommendations for Other Services OT consult     Precautions / Restrictions Precautions Precautions: Fall Required Braces or Orthoses: Knee Immobilizer - Left Knee Immobilizer - Left: On at all times Restrictions Weight Bearing Restrictions: Yes LLE Weight Bearing: Non  weight bearing      Mobility  Bed Mobility Overal bed mobility: Needs Assistance Bed Mobility: Rolling Rolling: Max assist         General bed mobility comments: Able to partial roll to side with use of bedrail.  Needs max A for full roll, limited by pain.  Able to scoot self up in bed with bed in trendelenberg with use of rail and strong R LE.  Attempted to sit EOB, pt unable to tolerate negotiation of L LE to EOB.  Trialed multiple times.  Worked on moving L LE in bed side to side to improve tolerance to movement, but still unable to negotiate L LE off EOB.    Transfers                        Ambulation/Gait                  Stairs            Wheelchair Mobility    Modified Rankin (Stroke Patients Only)       Balance                                             Pertinent Vitals/Pain Pain Assessment: 0-10 Pain Score: 10-Worst pain ever Pain Location: L knee Pain Descriptors / Indicators: Burning Pain Intervention(s): Limited activity within patient's tolerance;Monitored during session;Premedicated before session    Home Living Family/patient expects to be discharged to:: Private residence Living Arrangements: Parent Available Help at Discharge: Family Type of Home: House  Home Layout: One level Home Equipment: Agricultural consultant (2 wheels)      Prior Function Prior Level of Function : Needs assist       Physical Assist : ADLs (physical)     Mobility Comments: Pt. states she has been using RW since knee injury 6 weeks ago and staying at her mom's house. Prior to injury was indepedent ADLs Comments: Mom has been assisting with ADLs     Hand Dominance        Extremity/Trunk Assessment   Upper Extremity Assessment Upper Extremity Assessment: Overall WFL for tasks assessed    Lower Extremity Assessment Lower Extremity Assessment: LLE deficits/detail LLE Deficits / Details: Grossly 1/5 LLE: Unable to  fully assess due to pain       Communication   Communication: No difficulties  Cognition Arousal/Alertness: Awake/alert Behavior During Therapy: WFL for tasks assessed/performed Overall Cognitive Status: Within Functional Limits for tasks assessed                                 General Comments: Alert and oriented x 3.  Supine in bed when PT arrives.  Reluctant to try to move due to pain.  Needs inc encouragement.        General Comments      Exercises     Assessment/Plan    PT Assessment Patient needs continued PT services  PT Problem List Decreased strength;Decreased mobility;Decreased range of motion;Decreased activity tolerance;Decreased balance;Pain       PT Treatment Interventions DME instruction;Therapeutic exercise;Gait training;Balance training;Functional mobility training;Therapeutic activities;Patient/family education    PT Goals (Current goals can be found in the Care Plan section)  Acute Rehab PT Goals Patient Stated Goal: Pt's goal is to decrease pain PT Goal Formulation: With patient Time For Goal Achievement: 04/09/21 Potential to Achieve Goals: Good    Frequency Min 3X/week   Barriers to discharge Other (comment) (Pt. currently requiring total A.)      Co-evaluation               AM-PAC PT "6 Clicks" Mobility  Outcome Measure Help needed turning from your back to your side while in a flat bed without using bedrails?: A Lot Help needed moving from lying on your back to sitting on the side of a flat bed without using bedrails?: Total Help needed moving to and from a bed to a chair (including a wheelchair)?: Total Help needed standing up from a chair using your arms (e.g., wheelchair or bedside chair)?: Total Help needed to walk in hospital room?: Total Help needed climbing 3-5 steps with a railing? : Total 6 Click Score: 7    End of Session   Activity Tolerance: Patient limited by pain Patient left: in bed;with bed alarm  set;with call bell/phone within reach   PT Visit Diagnosis: History of falling (Z91.81);Pain;Other abnormalities of gait and mobility (R26.89) Pain - Right/Left: Left Pain - part of body: Knee    Time: 1236-1300 PT Time Calculation (min) (ACUTE ONLY): 24 min   Charges:   PT Evaluation $PT Eval Low Complexity: 1 Low PT Treatments $Therapeutic Activity: 8-22 mins       [facility] A. Milon Dethloff, PT, DPT Acute Rehabilitation Services Office: [phone]   Roselinda Bahena A Eleonor Ocon 03/26/2021, 1:03 PM

## 2021-03-27 LAB — CBC
HCT: 30.8 % — ABNORMAL LOW (ref 36.0–46.0)
Hemoglobin: 9.6 g/dL — ABNORMAL LOW (ref 12.0–15.0)
MCH: 20.7 pg — ABNORMAL LOW (ref 26.0–34.0)
MCHC: 31.2 g/dL (ref 30.0–36.0)
MCV: 66.5 fL — ABNORMAL LOW (ref 80.0–100.0)
Platelets: 183 10*3/uL (ref 150–400)
RBC: 4.63 MIL/uL (ref 3.87–5.11)
RDW: 20.5 % — ABNORMAL HIGH (ref 11.5–15.5)
WBC: 9.6 10*3/uL (ref 4.0–10.5)
nRBC: 0 % (ref 0.0–0.2)

## 2021-03-27 NOTE — Progress Notes (Signed)
Patient ID: [place] Name, female   DOB: 23-Jan-1991, 30 y.o.   MRN: [id number] Patient is status post left knee reconstruction.  She states she has some burning pain over the incision.  Recommend that she open the knee immobilizer to apply ice.  Make sure the knee immobilizer is in place fourth therapy.  Patient states she is having difficulty mobilizing.

## 2021-03-27 NOTE — Progress Notes (Signed)
Inpatient Rehab Admissions Coordinator Note:   Per PT/OT patient was screened for CIR candidacy by Thomas Mabry Luvenia Starch, CCC-SLP. At this time, pt has not yet attempted transfers and is pain limited.  Need to discuss medical necessity with physiatrist. If it is deemed pt has medical necessity, pt may have potential to progress to becoming a potential CIR candidate. CIR admissions team will follow.    Wolfgang Phoenix, MS, CCC-SLP Admissions Coordinator [phone] 03/27/21 2:34 PM

## 2021-03-27 NOTE — Evaluation (Signed)
Occupational Therapy Evaluation Patient Details Name: Meagan Oneill MRN: [id number] DOB: Jun 07, 1990 Today's Date: 03/27/2021   History of Present Illness Pt. is 30 yr old F admitted on 12/16 admitted for planned surgery to correct multiligament knee injury that occured 6 weeks ago.  Sx included L ACL, PCL, popliteal fibular ligament, lateral collateral ligament, and laternal meniscus root repair. PMH: anemia, asthma, HTN   Clinical Impression   Pt presents with pain and decreased balance, activity tolerance, and mobility. Pt currently requiring Min A for UB ADLs, Max A for LB ADLs, and Total A for toileting. Pt also requiring Max A for bed mobility. Pt lives alone and is able to stay with mother at d/c, however reports mother will be unable to provide level of assistance she needs at this time. Pt is highly motivated and could be good candidate for AIR given PLOF and current level of debility. Will follow acutely.     Recommendations for follow up therapy are one component of a multi-disciplinary discharge planning process, led by the attending physician.  Recommendations may be updated based on patient status, additional functional criteria and insurance authorization.   Follow Up Recommendations  Acute inpatient rehab (3hours/day)    Assistance Recommended at Discharge Frequent or constant Supervision/Assistance  Functional Status Assessment  Patient has had a recent decline in their functional status and demonstrates the ability to make significant improvements in function in a reasonable and predictable amount of time.  Equipment Recommendations  BSC/3in1;Tub/shower bench;Wheelchair (measurements OT);Wheelchair cushion (measurements OT);Hospital bed (bariatric, drop arm for [facility] and w/c)    Recommendations for Other Services       Precautions / Restrictions Precautions Precautions: Fall Required Braces or Orthoses: Knee Immobilizer - Left Knee Immobilizer - Left: On at all times       Mobility Bed Mobility Overal bed mobility: Needs Assistance Bed Mobility: Supine to Sit;Sit to Supine     Supine to sit: Max assist;HOB elevated Sit to supine: Max assist        Transfers                   General transfer comment: Deferred.      Balance Overall balance assessment: Needs assistance Sitting-balance support: Feet supported Sitting balance-Leahy Scale: Fair                                     ADL either performed or assessed with clinical judgement   ADL Overall ADL's : Needs assistance/impaired Eating/Feeding: Independent;Sitting   Grooming: Set up;Sitting   Upper Body Bathing: Minimal assistance;Sitting   Lower Body Bathing: Maximal assistance;Sitting/lateral leans   Upper Body Dressing : Set up;Sitting   Lower Body Dressing: Maximal assistance;Sitting/lateral leans       Toileting- Clothing Manipulation and Hygiene: Total assistance;+2 for physical assistance;+2 for safety/equipment;Sitting/lateral lean               Vision Patient Visual Report: No change from baseline       Perception     Praxis      Pertinent Vitals/Pain Pain Location: L knee Pain Descriptors / Indicators: Burning     Hand Dominance Right   Extremity/Trunk Assessment         Cervical / Trunk Assessment Cervical / Trunk Assessment: Normal   Communication Communication Communication: No difficulties   Cognition Arousal/Alertness: Awake/alert Behavior During Therapy: WFL for tasks assessed/performed Overall Cognitive Status: Within Functional Limits  for tasks assessed                                       General Comments       Exercises     Shoulder Instructions      Home Living Family/patient expects to be discharged to:: Private residence Living Arrangements: Parent Available Help at Discharge: Family Type of Home: House Home Access: Level entry     Home Layout: One level     Bathroom  Shower/Tub: Chief Strategy Officer: Standard     Home Equipment: Agricultural consultant (2 wheels);Shower seat          Prior Functioning/Environment Prior Level of Function : Needs assist       Physical Assist : ADLs (physical)     Mobility Comments: Pt. states she has been using RW since knee injury 6 weeks ago and staying at her mom's house. Prior to injury was indepedent ADLs Comments: Mom has been assisting with ADLs        OT Problem List: Decreased activity tolerance;Impaired balance (sitting and/or standing);Decreased knowledge of use of DME or AE;Decreased knowledge of precautions;Pain;Obesity      OT Treatment/Interventions: Self-care/ADL training;Therapeutic exercise;DME and/or AE instruction;Therapeutic activities;Patient/family education;Balance training    OT Goals(Current goals can be found in the care plan section) Acute Rehab OT Goals OT Goal Formulation: With patient Time For Goal Achievement: 04/10/21 Potential to Achieve Goals: Good  OT Frequency: Min 2X/week   Barriers to D/C: Decreased caregiver support          Co-evaluation              AM-PAC OT "6 Clicks" Daily Activity     Outcome Measure Help from another person eating meals?: None Help from another person taking care of personal grooming?: A Little Help from another person toileting, which includes using toliet, bedpan, or urinal?: Total Help from another person bathing (including washing, rinsing, drying)?: A Lot Help from another person to put on and taking off regular upper body clothing?: A Little Help from another person to put on and taking off regular lower body clothing?: A Lot 6 Click Score: 15   End of Session Equipment Utilized During Treatment: Left knee immobilizer Nurse Communication: Mobility status  Activity Tolerance: Patient limited by pain Patient left: in bed;with call bell/phone within reach  OT Visit Diagnosis: Other abnormalities of gait and mobility  (R26.89);Pain Pain - Right/Left: Left Pain - part of body: Knee                Time: 1610-9604 OT Time Calculation (min): 42 min Charges:  OT General Charges $OT Visit: 1 Visit OT Evaluation $OT Eval Moderate Complexity: 1 Mod OT Treatments $Self Care/Home Management : 8-22 mins $Therapeutic Activity: 8-22 mins  Meagan Oneill C, OT/L  Acute Rehab [phone]  Lenice Llamas 03/27/2021, 1:05 PM

## 2021-03-27 NOTE — TOC Initial Note (Signed)
Transition of Care [facility]) - Initial/Assessment Note    Patient Details  Name: Meagan Oneill MRN: [id number] Date of Birth: 23-Feb-1991  Transition of Care [facility] CM/SW Contact:    Jimmy Picket, LCSW Phone Number: 03/27/2021, 1:04 PM  Clinical Narrative:                  CSW spoke to pt via phone. CSW introduced self and explained role. Pt states PTA she has been at her moms house for the past 6 weeks. Prior to that she was living at home independent. Pt reports she was injured at work and that she has Art therapist and a Clinical research associate working with her. Pt stated she didn't have the information on her but she can get it.   CSW explained that pt is recommended for SNF. CSW also explained that due to pts age and no insurance that SNF will be unlikely. Pt inquired about if workmans comp will pay for it. CSW explained that she will have to look further into that.   Pt shared that although she is staying with her mother, her mother is disabled and she will not be able to assist her much if she goes home.   CSW secure chatted PT/OT if inpatient rehab ([facility] or [facility])  may be an option? CSW also inquired about the [facility] program?  Expected Discharge Plan: Skilled Nursing Facility Barriers to Discharge: Inadequate or no insurance, Continued Medical Work up   Patient Goals and CMS Choice Patient states their goals for this hospitalization and ongoing recovery are:: to get better      Expected Discharge Plan and Services Expected Discharge Plan: Skilled Nursing Facility       Living arrangements for the past 2 months: Single Family Home                                      Prior Living Arrangements/Services Living arrangements for the past 2 months: Single Family Home Lives with:: Self Patient language and need for interpreter reviewed:: Yes Do you feel safe going back to the place where you live?: Yes      Need for Family Participation in Patient Care: Yes (Comment) Care giver support  system in place?: Yes (comment)   Criminal Activity/Legal Involvement Pertinent to Current Situation/Hospitalization: No - Comment as needed  Activities of Daily Living Home Assistive Devices/Equipment: None ADL Screening (condition at time of admission) Patient's cognitive ability adequate to safely complete daily activities?: Yes Is the patient deaf or have difficulty hearing?: No Does the patient have difficulty seeing, even when wearing glasses/contacts?: No Does the patient have difficulty concentrating, remembering, or making decisions?: No Patient able to express need for assistance with ADLs?: Yes Does the patient have difficulty dressing or bathing?: Yes Independently performs ADLs?: No Communication: Independent Dressing (OT): Needs assistance Is this a change from baseline?: Change from baseline, expected to last >3 days Grooming: Needs assistance Is this a change from baseline?: Change from baseline, expected to last >3 days Feeding: Independent Bathing: Needs assistance Is this a change from baseline?: Change from baseline, expected to last >3 days Toileting: Needs assistance Is this a change from baseline?: Change from baseline, expected to last >3days In/Out Bed: Needs assistance Is this a change from baseline?: Change from baseline, expected to last >3 days Walks in Home: Independent Does the patient have difficulty walking or climbing stairs?: Yes Weakness of Legs:  Both Weakness of Arms/Hands: None  Permission Sought/Granted Permission sought to share information with : Facility Industrial/product designer granted to share information with : Yes, Verbal Permission Granted              Emotional Assessment Appearance:: Appears stated age Attitude/Demeanor/Rapport: Engaged Affect (typically observed): Appropriate Orientation: : Oriented to Situation, Oriented to  Time, Oriented to Place, Oriented to Self Alcohol / Substance Use: Not Applicable Psych  Involvement: No (comment)  Admission diagnosis:  S/P left knee surgery [Z98.890] Patient Active Problem List   Diagnosis Date Noted   S/P left knee surgery 03/25/2021   PCP:  Patient, No Pcp Per (Inactive) Pharmacy:   [id number] [place], [place] - [address] AT [facility] [address] [place] [address] Phone: [phone] Fax: [phone]  [facility] #[id number] - [place], [place] - [address] AT [facility] OF [address] [place] [address] Phone: [phone] Fax: [phone]  [facility] [id number] - [place],  - [address] AT [facility] [address]  [place] [address] Phone: [phone] Fax: [phone]     Social Determinants of Health (SDOH) Interventions    Readmission Risk Interventions No flowsheet data found.  Jimmy Picket, LCSW Clinical Social Worker

## 2021-03-27 NOTE — Plan of Care (Signed)

## 2021-03-27 NOTE — Progress Notes (Signed)
Physical Therapy Treatment Patient Details Name: Meagan Oneill MRN: [id number] DOB: [birth year] Today's Date: 03/27/2021   History of Present Illness Pt. is 30 yr old F admitted on 12/16 admitted for planned surgery to correct multiligament knee injury that occured 6 weeks ago.  Sx included L ACL, PCL, popliteal fibular ligament, lateral collateral ligament, and laternal meniscus root repair. PMH: anemia, asthma, HTN    PT Comments    Pt required max assist bed mobility and demonstrates fair sitting balance. Unable to progress OOB due to pain.Pt returned to supine in bed to perform LE exercises. Ice in place L knee for pain. Pt is motivated to participate in therapy and progress her mobility. Considering age and PLOF, recommending CIR consult.     Recommendations for follow up therapy are one component of a multi-disciplinary discharge planning process, led by the attending physician.  Recommendations may be updated based on patient status, additional functional criteria and insurance authorization.  Follow Up Recommendations  Acute inpatient rehab (3hours/day)     Assistance Recommended at Discharge Frequent or constant Supervision/Assistance  Equipment Recommendations  Rolling walker (2 wheels);Wheelchair (measurements PT);BSC/3in1 (bariatric)    Recommendations for Other Services       Precautions / Restrictions Precautions Precautions: Fall Required Braces or Orthoses: Knee Immobilizer - Left Knee Immobilizer - Left: On at all times Restrictions LLE Weight Bearing: Non weight bearing     Mobility  Bed Mobility Overal bed mobility: Needs Assistance Bed Mobility: Supine to Sit;Sit to Supine     Supine to sit: Max assist;HOB elevated Sit to supine: Max assist   General bed mobility comments: assist with LLE and trunk, +rail, increased time    Transfers                   General transfer comment: unable to progress beyond EOB due to pain    Ambulation/Gait                    Stairs             Wheelchair Mobility    Modified Rankin (Stroke Patients Only)       Balance Overall balance assessment: Needs assistance Sitting-balance support: Feet supported Sitting balance-Leahy Scale: Fair                                      Cognition Arousal/Alertness: Awake/alert Behavior During Therapy: WFL for tasks assessed/performed Overall Cognitive Status: Within Functional Limits for tasks assessed                                          Exercises General Exercises - Lower Extremity Ankle Circles/Pumps: AROM;Both;10 reps;Supine Gluteal Sets: AROM;Both;10 reps;Supine Heel Slides: AROM;Right;10 reps;Supine    General Comments General comments (skin integrity, edema, etc.): resting HR in 100s      Pertinent Vitals/Pain Pain Assessment: 0-10 Pain Score: 6  Pain Location: L knee Pain Descriptors / Indicators: Burning;Discomfort Pain Intervention(s): Limited activity within patient's tolerance;Monitored during session;Ice applied;Premedicated before session    [place] expects to be discharged to:: Private residence Living Arrangements: Parent Available Help at Discharge: Family Type of Home: House Home Access: Level entry       [place]: [place]: Conservation officer, nature (2 wheels);Shower seat  Prior Function            PT Goals (current goals can now be found in the care plan section) Acute Rehab PT Goals Patient Stated Goal: home Progress towards PT goals: Progressing toward goals    Frequency    Min 3X/week      PT Plan Discharge plan needs to be updated    Co-evaluation              AM-PAC PT "6 Clicks" Mobility   Outcome Measure  Help needed turning from your back to your side while in a flat bed without using bedrails?: A Lot Help needed moving from lying on your back to sitting on the side of a flat bed without using  bedrails?: Total Help needed moving to and from a bed to a chair (including a wheelchair)?: Total Help needed standing up from a chair using your arms (e.g., wheelchair or bedside chair)?: Total Help needed to walk in hospital room?: Total Help needed climbing 3-5 steps with a railing? : Total 6 Click Score: 7    End of Session   Activity Tolerance: Patient limited by pain Patient left: in bed;with call bell/phone within reach   PT Visit Diagnosis: History of falling (Z91.81);Pain;Other abnormalities of gait and mobility (R26.89) Pain - Right/Left: Left Pain - part of body: Knee     Time: 0124-0139 PT Time Calculation (min) (ACUTE ONLY): 15 min  Charges:  $Therapeutic Exercise: 8-22 mins                     Aida Raider, PT  Office # [phone] Pager [phone]    Ilda Foil 03/27/2021, 1:22 PM

## 2021-03-28 ENCOUNTER — Encounter (HOSPITAL_COMMUNITY): Payer: Self-pay | Admitting: Orthopedic Surgery

## 2021-03-28 DIAGNOSIS — R002 Palpitations: Secondary | ICD-10-CM

## 2021-03-28 NOTE — Op Note (Signed)
NAME: Meagan Oneill, Meagan Oneill MEDICAL RECORD NO: [id number] ACCOUNT NO: [id number] DATE OF BIRTH: 12/11/1990 FACILITY: [facility] LOCATION: [facility] PHYSICIAN: Yetta Barre. Marlou Sa, MD  Operative Report   DATE OF PROCEDURE: 03/25/2021  PREOPERATIVE DIAGNOSES:  Left knee dislocation with 1.  Posterior cruciate ligament tear. 2.  Anterior cruciate ligament tear. 3.  Lateral meniscal root avulsion. 4.  Posterolateral corner injury involving the popliteal fibular ligament and lateral collateral ligament. 5.  Peroneal nerve scarring.  POSTOPERATIVE DIAGNOSES:  Left knee dislocation with 1.  Posterior cruciate ligament tear. 2.  Anterior cruciate ligament tear. 3.  Lateral meniscal root avulsion. 4.  Posterolateral corner injury involving the popliteal fibular ligament and lateral collateral ligament. 5.  Peroneal nerve scarring.  PROCEDURE PERFORMED:    1.  Arthroscopic PCL reconstruction using dual Endobutton allograft fixation of 11.5 mm all tendon allograft. 2.  Arthroscopic ACL reconstruction using bone-patellar tendon-bone allograft with femoral Endobutton fixation and interference screw fixation on the tibia with backup fixation on the tibia with SwiveLock. 3.  Lateral posterior horn medial meniscal root repair, arthroscopic. 4.  Posterolateral corner reconstruction with popliteal fibular ligament reconstruction and lateral collateral ligament reconstruction using 5 mm gracilis allograft. 5.  Peroneal nerve neurolysis.  INDICATIONS:  The patient is a 30 year old patient who is 5 weeks out left knee dislocation.  Presents for operative management after explanation of risks and benefits.  Case complexity increased by BMI of 67.  DESCRIPTION OF PROCEDURE:  The patient was brought to the operating room where general endotracheal anesthesia was induced.  Preoperative antibiotics administered.  Timeout was called.  It took some time to position the patient on the bed due to her  size.  Left knee was examined  under anesthesia and found to have an anterior, posterior instability consistent with ACL and PCL injury.  The patient also had increased lateral instability to varus stress at 0 and 30 degrees compared to the right hand  side.  There was slight increase in posterolateral rotatory instability on the left knee compared to the right with examination under anesthesia.  The patient's left leg was prescrubbed with alcohol and Betadine, allowed to air dry.  Prepped with  DuraPrep solution and draped in a sterile manner.  Ioban used to cover the operative field.  Tourniquet was not utilized for the arthroscopic portion of the case.  After calling timeout, the portals were anesthetized using Marcaine with epinephrine.   Anterior inferolateral and anterior inferomedial portals were established.  Diagnostic arthroscopy was performed.  The patient did have tears of the ACL and PCL.  The intercondylar notch region was prepared with both the shaver and the Arthrocare wand.   The posterior portal was created with some difficulty due to the patient's size.  This posterior medial portal was initially attempted to localize with a spinal needle.  Then, we used the switching stick.  We had to wait for equipment for the dilator to  allow for placement of a cannula.  We had to use a long cannula, 9 cm, due to the patient's size.  Through that posterior cannula, we were using the Arthrocare wand.  The footprint of the PCL was debrided.  Care was taken to avoid injury to posterior  neurovascular structures.  Next, under fluoroscopic guidance, an incision made along the anteromedial tibial crest.  With the setting of 60 degrees, the tibial tunnel for the PCL was drilled in the native PCL tibial footprint.  This was confirmed under  fluoroscopy.  An 11.5 mm FlipCutter was  utilized.  Next, the femoral tunnel was created at the 11 o'clock position on the medial femoral condyle.  Same incision we used for the portal was used for this  tunnel creation.  Next, the graft was passed and  secured first into the femur using Endobutton technique, which was confirmed under fluoroscopy.  Then, with the knee flexed at 90 degrees, tibial step-off was restored and this was tightened over a button.  Very good stability was achieved with a very  robust allograft.  Next, attention was directed towards ACL tunnels.  Notchplasty performed.  At this time, also, menisci were inspected and found to have a meniscal root avulsion on the lateral side.  Medial meniscus was intact.  This was repaired by  avoiding the ACL and PCL tunnels.  Arthroscopic drill was utilized and two grasping 0 FiberTapes were placed.  This gave a nice placement into the native posterior meniscal root attachment site.  A small tunnel was drilled about 6-7 mm.  Sutures were  then pulled into this tunnel and an effective meniscal root repair was achieved.  ACL tunnels had been drilled first, particularly on the tibial side.  ACL graft was passed and secured with Endobutton on the femoral side, which was confirmed under  fluoroscopy.  Then, it was secured on the tibial side using interference screw and backup fixation and extension.  Now, the backup fixation was also utilized on the PCL side into which the meniscal root repair sutures were tied with the knee in about 20  degrees of flexion.  This gave very secure fixation of that meniscal root.  Thorough irrigation of the knee joint was performed.  Anterior, posterior stability was restored.  The lateral meniscal repair was intact. Not too much arthritis was present on  either the lateral or medial compartments.  The multiple incisions for the portals were irrigated and then vancomycin powder placed and these incisions were closed using 0 Vicryl suture, 2-0 Vicryl suture, and 3-0 Monocryl.  Next, attention directed  towards the lateral side of the knee.  The tourniquet was inflated at this time and an incision was made centered over the  distal femur and extending down between Gerdy's tubercle and the fibular head.  Skin and subcutaneous tissue were sharply divided.   The peroneal nerve was identified.  There was some scar tissue around it from the injury.  Neurolysis was performed under direct visualization.  The peroneal nerve was protected at all times during the case.  Fibular head was exposed at the level just  below the equator on the fibular head.  Due to the patient's size, we actually had to create an accessory incision in order to appropriately drill the fibular head.  Fibular head was drilled, 5 mm drill and sutures passed.  Next, an incision was made  over the mid equator of the distal lateral femoral condyle.  The capsule and iliotibial band was divided.  The drill guide was then utilized and a tunnel was made in the superior aspect of the popliteal hiatus.  A 5 mm allograft was then placed with a  SwiveLock, obtaining very good purchase in the bone.  This was then passed behind the iliotibial band to a point in the posterior aspect of the fibular head.  The graft was then passed from back to front through the fibular head for the popliteal fibular  ligament reconstruction.  Good rotational stability was present.  This was secured with an interference screw and bone grafting into the  fibular head region.  This gave very good fixation.  Next, the graft was then taken back up underneath the  iliotibial band, but on top of the popliteus to the native FCL attachment site.  This was then tacked down using another interference screw with the leg in valgus alignment and about 20 degrees of flexion.  This gave good stability.  Under fluoroscopy,  the lateral side opened up about 5-6 mm beforehand and opened up afterwards about 2-3 mm with good endpoint.  Tourniquet was released, bleeding points encountered were controlled using electrocautery.  Thorough irrigation was performed.  Iliotibial band  was then closed using #1 Vicryl suture.   The peroneal nerve which had vessel loop around it and was protected, was also inspected and found to be intact.  Vessel loop removed.  Vancomycin powder placed.  This incision then closed in layers to remove dead  space using #1 Vicryl suture, 0 Vicryl suture, 2-0 Vicryl suture, and 3-0 nylon.  A Prevena wound VAC was then applied.  Impervious dressing was then applied to the rest of the knee.  The patient tolerated the procedure well without immediate  complication.  Ace wrap and knee immobilizer were placed.  Luke's assistance was required for graft preparation, opening, closing, mobilization of tissue.  His assistance was a medical necessity.  The patient was transferred to the recovery room in  stable condition.  It should be noted that prior to final closure, a solution of Marcaine, morphine, clonidine injected into the knee for postoperative pain relief.   SHW D: 03/25/2021 5:46:58 pm T: 03/26/2021 6:31:00 am  JOB: [id number]/ [id number]

## 2021-03-28 NOTE — Discharge Instructions (Signed)

## 2021-03-28 NOTE — Progress Notes (Signed)
Mobility Specialist Criteria Algorithm Info.   03/28/21 1140  Pain Assessment  Pain Assessment 0-10  Pain Score 6  Pain Location L knee (Only while moving)  Pain Descriptors / Indicators Burning  Pain Intervention(s) Limited activity within patient's tolerance;Monitored during session;Premedicated before session  Mobility  Activity Sat and stood x 3;Stood at bedside  Range of Motion/Exercises Passive;Left leg  Level of Assistance Moderate assist, patient does 50-74%  Assistive Device Front wheel walker  LLE Weight Bearing NWB  Minutes Stood 7 minutes  Mobility Sit up in bed/chair position for meals  Mobility Response Tolerated well  Mobility performed by Mobility specialist  Bed Position Semi-fowlers   Patient received lying in bed eager and motivated to participate in mobility. Covered questions regarding therapy goals and difference between SNF and CIR. Completed education on NWB precautions and standing mechanics with receptive teach back. Pt tolerated better since being admitted. Got to EOB with mod A, more so assisting LLE to EOB. Performed sit>stand x3. Was min guard to stand first trial but required min A as pain increased with progression. Cues for hand and foot placement to adhere NWB on LLE. Declined sitting in recliner or further mobility. Returned to EOB needing assistance with LE's from sit>supine. Was left with all needs met and call bell in reach.    03/28/2021 11:40 AM

## 2021-03-28 NOTE — Consult Note (Signed)
Cardiology Consultation:   Patient ID: Meagan Oneill MRN: [id number]; DOB: 1990-04-11  Admit date: 03/25/2021 Date of Consult: 03/28/2021  PCP:  Patient, No Pcp Per (Inactive)   [facility] Providers Cardiologist:  None        Patient Profile:   Meagan Oneill is a 30 y.o. female with a hx of anemia,asthma, HTN, allergies, who is being seen 03/28/2021 for the evaluation of palpitations at the request of Dr August Saucer.  History of Present Illness:   Meagan Oneill had fallen and dislocated her L knee on 02/14/2021. She was to have surgery when she got to 90 degrees ROM. MRI showed ACL/PCL injury. She came to the hospital for surgery on 03/25/2021.  She had left knee anterior cruciate ligament, posterior cruciate ligment, posterolateral corner reconstruction of popliteal fibular ligament and lateral collateral ligament, arthroscopy; allograft ligaments for reconstruction, lateral meniscal root repair.  She tolerated the procedure well.   On 12/19, as she was working on increasing her mobility, she had palpitations, HR up to 150 >> Cards asked to see.   Meagan Oneill had never had palpitations or dizziness before she injured her knee.  While she was at home waiting for surgery, she was having orthostatic dizziness, but no syncope or true presyncope, and no additional falls.  She had no palpitations.  After the surgery, for the first couple of days, she did fine.  However, last night, she was lying in bed trying to go to sleep and had tachypalpitations with heart rate as high as 150.  They lasted for about 10 minutes and she was a little lightheaded during this.  Her palpitations resolved without intervention.  No vital signs or EKG was obtained while she was having the palpitations.  An ECG obtained today did not show any acute abnormalities.    Past Medical History:  Diagnosis Date   Anemia    Asthma    Environmental allergies    Hypertension     Past Surgical History:  Procedure Laterality  Date   ANTERIOR CRUCIATE LIGAMENT REPAIR Left 02/24/2021     Home Medications:  Prior to Admission medications   Medication Sig Start Date End Date Taking? Authorizing Provider  acetaminophen (TYLENOL) 500 MG tablet Take 1,000 mg by mouth every 6 (six) hours as needed for moderate pain.   Yes [provider]  ibuprofen (ADVIL) 600 MG tablet Take 1 tablet (600 mg total) by mouth every 6 (six) hours as needed. Patient not taking: Reported on 03/16/2021 10/21/18   Couture, Cortni S, PA-C  metroNIDAZOLE (FLAGYL) 500 MG tablet Take 1 tablet (500 mg total) by mouth 2 (two) times daily. Patient not taking: Reported on 03/16/2021 09/11/19   Alvino Chapel [place], PA-C  oxyCODONE-acetaminophen (PERCOCET/ROXICET) 5-325 MG tablet Take 1 tablet by mouth every 6 (six) hours as needed for severe pain. 02/16/21   Triplett, Tammy, PA-C  ferrous sulfate 325 (65 FE) MG tablet Take 1 tablet (325 mg total) by mouth daily for 14 days. 10/21/18 09/11/19  Couture, Cortni S, PA-C    Inpatient Medications: Scheduled Meds:  celecoxib  200 mg Oral BID   Chlorhexidine Gluconate Cloth  6 each Topical Daily   docusate sodium  100 mg Oral BID   rivaroxaban  10 mg Oral Daily   Continuous Infusions:  methocarbamol (ROBAXIN) IV     PRN Meds: acetaminophen, HYDROmorphone (DILAUDID) injection, methocarbamol **OR** methocarbamol (ROBAXIN) IV, metoCLOPramide **OR** metoCLOPramide (REGLAN) injection, ondansetron **OR** ondansetron (ZOFRAN) IV, oxyCODONE  Allergies:    Allergies  Allergen  Reactions   Morphine And Related Hives    Social History:   Social History   Socioeconomic History   Marital status: Single    Spouse name: Not on file   Number of children: Not on file   Years of education: Not on file   Highest education level: Not on file  Occupational History   Not on file  Tobacco Use   Smoking status: Never   Smokeless tobacco: Never  Vaping Use   Vaping Use: Never used  Substance and Sexual Activity    Alcohol use: Yes    Alcohol/week: 1.0 standard drink    Types: 1 Glasses of wine per week    Comment: occas.   Drug use: No   Sexual activity: Not on file  Other Topics Concern   Not on file  Social History Narrative   Not on file   Social Determinants of Health   Financial Resource Strain: Not on file  Food Insecurity: Not on file  Transportation Needs: Not on file  Physical Activity: Not on file  Stress: Not on file  Social Connections: Not on file  Intimate Partner Violence: Not on file    Family History:   Family History  Problem Relation Age of Onset   Hypertension Mother    Diabetes Mother    Hypertension Sister      ROS:  Please see the history of present illness.  All other ROS reviewed and negative.     Physical Exam/Data:   Vitals:   03/27/21 1149 03/27/21 2041 03/28/21 0300 03/28/21 0757  BP: 121/85 (!) 141/87 125/90 127/76  Pulse: 100 100 75 (!) 101  Resp: 17 20 18 17   Temp: 98.1 F (36.7 C) 98.4 F (36.9 C) 97.7 F (36.5 C) 98.6 F (37 C)  TempSrc:  Oral Oral Oral  SpO2: 99% 100% 100% 96%  Weight:        Intake/Output Summary (Last 24 hours) at 03/28/2021 1430 Last data filed at 03/28/2021 0733 Gross per 24 hour  Intake --  Output 650 ml  Net -650 ml   Last 3 Weights 03/25/2021 03/22/2021 02/16/2021  Weight (lbs) 379 lb 379 lb 380 lb  Weight (kg) 171.913 kg 171.913 kg 172.367 kg     Body mass index is 67.14 kg/m.  General:  Well nourished, well developed, in no acute distress HEENT: normal Neck: no JVD Vascular: No carotid bruits; Distal pulses 2+ bilaterally Cardiac:  normal S1, S2; RRR; no murmur  Lungs:  clear to auscultation bilaterally, no wheezing, rhonchi or rales  Abd: soft, nontender, no hepatomegaly  Ext: no edema Musculoskeletal:  No deformities, BUE strength normal and equal, LLE immobilized and not disturbed, RLE normal Skin: warm and dry  Neuro:  CNs 2-12 intact, no focal abnormalities noted Psych:  Normal affect    EKG:  The EKG was personally reviewed and demonstrates:   12/13 ECG is SR, HR 78, no acute ischemic changes 5/19 ECG is sinus tach, heart rate 100s, no acute abnormalities Telemetry:  Telemetry was personally reviewed and demonstrates:  SR  Relevant CV Studies:  None previously  Laboratory Data:  High Sensitivity Troponin:  No results for input(s): TROPONINIHS in the last 720 hours.   Chemistry Recent Labs  Lab 03/22/21 1304 03/26/21 0254  NA 139 138  K 3.8 3.8  CL 104 104  CO2 27 27  GLUCOSE 119* 123*  BUN 12 8  CREATININE 0.68 0.87  CALCIUM 9.3 8.6*  GFRNONAA >60 >60  ANIONGAP  8 7    No results for input(s): PROT, ALBUMIN, AST, ALT, ALKPHOS, BILITOT in the last 168 hours. Lipids No results for input(s): CHOL, TRIG, HDL, LABVLDL, LDLCALC, CHOLHDL in the last 168 hours.  Hematology Recent Labs  Lab 03/22/21 1304 03/26/21 0254 03/27/21 0431  WBC 8.3 13.3* 9.6  RBC 5.51* 5.09 4.63  HGB 11.4* 10.3* 9.6*  HCT 37.0 34.5* 30.8*  MCV 67.2* 67.8* 66.5*  MCH 20.7* 20.2* 20.7*  MCHC 30.8 29.9* 31.2  RDW 21.2* 21.1* 20.5*  PLT 221 130* 183   Thyroid No results for input(s): TSH, FREET4 in the last 168 hours.  BNPNo results for input(s): BNP, PROBNP in the last 168 hours.  DDimer No results for input(s): DDIMER in the last 168 hours.   Radiology/Studies:  DG Knee 1-2 Views Left  Result Date: 03/25/2021 CLINICAL DATA:  Fluoroscopic assistance for left knee surgery EXAM: LEFT KNEE - 1-2 VIEW COMPARISON:  None. FINDINGS: Fluoroscopic guidance was provided for left knee surgery. Fluoroscopic time 17.7 seconds. Fluoroscopic dose 2.19 mGy. IMPRESSION: Fluoroscopic assistance was provided for left knee surgery. Electronically Signed   By: Ernie Avena M.D.   On: 03/25/2021 12:58   DG C-Arm 1-60 Min-No Report  Result Date: 03/25/2021 Fluoroscopy was utilized by the requesting physician.  No radiographic interpretation.   DG C-Arm 1-60 Min-No Report  Result Date:  03/25/2021 Fluoroscopy was utilized by the requesting physician.  No radiographic interpretation.   DG C-Arm 1-60 Min-No Report  Result Date: 03/25/2021 Fluoroscopy was utilized by the requesting physician.  No radiographic interpretation.     Assessment and Plan:   Palpitations - unclear cause because they have not been captured on tele/ECG - on telemetry now, continue this - MD advise if she should wear a monitor at d/c - increased catecholamines could influence palpitations - will ck TSH as well. - reportedly has hx HTN, on no meds pta - MD advise on adding BB, think BP will tolerate - MD advise on checking echo  2. Reconstructive knee surgery - lasted 8 hr, per pt - she has struggled w/ pain control - lives w/ mother who is not able to do much for her - discussions ongoing re: placement for rehab   Risk Assessment/Risk Scores:       For questions or updates, please contact [facility] Please consult [email or web address] for contact info under    Signed, Theodore Demark, PA-C  03/28/2021 2:30 PM

## 2021-03-28 NOTE — Progress Notes (Signed)
°  Subjective: Patient is a 30 year old female who presents POD 3 s/p left knee multi ligament reconstruction with ACL, PCL, posterior lateral corner as well as meniscal root repair.  She reports that she is doing well overall with pain that is significantly improved compared with POD 1.  She has not been able to ambulate but she was able to get out of bed and stand up at bedside today which is an improvement.  She denies any chest pain or shortness of breath, calf pain, fevers, chills, night sweats.  She does complain of palpitations intermittently that are not related to her pain level.  This is new for her since her procedure.  Objective: Vital signs in last 24 hours: Temp:  [97.7 F (36.5 C)-98.6 F (37 C)] 98.6 F (37 C) (12/19 0757) Pulse Rate:  [75-101] 101 (12/19 0757) Resp:  [17-20] 17 (12/19 0757) BP: (125-141)/(76-90) 127/76 (12/19 0757) SpO2:  [96 %-100 %] 96 % (12/19 0757)  Intake/Output from previous day: 12/18 0701 - 12/19 0700 In: -  Out: 850 [Urine:850] Intake/Output this shift: Total I/O In: -  Out: 300 [Urine:300]  Exam:  Ortho exam demonstrates left knee with positive effusion.  Postop dressings are intact.  Wound VAC sponge in good position with suction intact.  No significant calf tenderness, negative Homans' sign.  Palpable DP pulse of the operative extremity rated 2+.  Intact dorsiflexion and plantarflexion strength.  Labs: Recent Labs    03/26/21 0254 03/27/21 0431  HGB 10.3* 9.6*   Recent Labs    03/26/21 0254 03/27/21 0431  WBC 13.3* 9.6  RBC 5.09 4.63  HCT 34.5* 30.8*  PLT 130* 183   Recent Labs    03/26/21 0254  NA 138  K 3.8  CL 104  CO2 27  BUN 8  CREATININE 0.87  GLUCOSE 123*  CALCIUM 8.6*   No results for input(s): LABPT, INR in the last 72 hours.  Assessment/Plan: Patient is POD 3 s/p left knee multi ligament reconstruction.  Pain is doing well and her mobility is slowly but steadily improving.  Plan is working toward CIR  transfer or if that is not possible then plan for skilled nursing facility.  However, with her complaint of palpitations with her heart rate reaching up to 150 at times, plan for consult to cardiology for further evaluation.  Placed on cardiac monitoring for now.   Courtny Bennison L Elora Wolter 03/28/2021, 1:17 PM

## 2021-03-28 NOTE — Anesthesia Postprocedure Evaluation (Signed)
Anesthesia Post Note  Patient: Meagan Oneill  Procedure(s) Performed: left knee anterior cruciate ligament, posterior cruciate ligment, posterolateral corner reconstruction arthroscopy; allograft ligaments for reconstruction, meniscal debridement vs repair (Left: Knee)     Patient location during evaluation: PACU Anesthesia Type: General Level of consciousness: awake and alert Pain management: pain level controlled Vital Signs Assessment: post-procedure vital signs reviewed and stable Respiratory status: spontaneous breathing, nonlabored ventilation, respiratory function stable and patient connected to nasal cannula oxygen Cardiovascular status: blood pressure returned to baseline and stable Postop Assessment: no apparent nausea or vomiting Anesthetic complications: no   No notable events documented.  Last Vitals:  Vitals:   03/28/21 1540 03/28/21 1924  BP: 136/86 120/83  Pulse: (!) 102 (!) 109  Resp: 16 16  Temp: 36.8 C 36.7 C  SpO2: 96% 96%    Last Pain:  Vitals:   03/28/21 1924  TempSrc: Oral  PainSc:                  Kennieth Rad

## 2021-03-29 DIAGNOSIS — R000 Tachycardia, unspecified: Secondary | ICD-10-CM

## 2021-03-29 LAB — CBC WITH DIFFERENTIAL/PLATELET
Abs Immature Granulocytes: 0.04 10*3/uL (ref 0.00–0.07)
Basophils Absolute: 0 10*3/uL (ref 0.0–0.1)
Basophils Relative: 0 %
Eosinophils Absolute: 0.3 10*3/uL (ref 0.0–0.5)
Eosinophils Relative: 3 %
HCT: 32.6 % — ABNORMAL LOW (ref 36.0–46.0)
Hemoglobin: 9.8 g/dL — ABNORMAL LOW (ref 12.0–15.0)
Immature Granulocytes: 0 %
Lymphocytes Relative: 25 %
Lymphs Abs: 2.6 10*3/uL (ref 0.7–4.0)
MCH: 20.2 pg — ABNORMAL LOW (ref 26.0–34.0)
MCHC: 30.1 g/dL (ref 30.0–36.0)
MCV: 67.4 fL — ABNORMAL LOW (ref 80.0–100.0)
Monocytes Absolute: 0.5 10*3/uL (ref 0.1–1.0)
Monocytes Relative: 5 %
Neutro Abs: 7 10*3/uL (ref 1.7–7.7)
Neutrophils Relative %: 67 %
Platelets: 241 10*3/uL (ref 150–400)
RBC: 4.84 MIL/uL (ref 3.87–5.11)
RDW: 20.7 % — ABNORMAL HIGH (ref 11.5–15.5)
WBC: 10.4 10*3/uL (ref 4.0–10.5)
nRBC: 0 % (ref 0.0–0.2)

## 2021-03-29 LAB — COMPREHENSIVE METABOLIC PANEL
ALT: 15 U/L (ref 0–44)
AST: 17 U/L (ref 15–41)
Albumin: 2.5 g/dL — ABNORMAL LOW (ref 3.5–5.0)
Alkaline Phosphatase: 55 U/L (ref 38–126)
Anion gap: 7 (ref 5–15)
BUN: 9 mg/dL (ref 6–20)
CO2: 27 mmol/L (ref 22–32)
Calcium: 8.7 mg/dL — ABNORMAL LOW (ref 8.9–10.3)
Chloride: 101 mmol/L (ref 98–111)
Creatinine, Ser: 0.61 mg/dL (ref 0.44–1.00)
GFR, Estimated: >60 mL/min (ref >60)
Glucose, Bld: 97 mg/dL (ref 70–99)
Potassium: 3.9 mmol/L (ref 3.5–5.1)
Sodium: 135 mmol/L (ref 135–145)
Total Bilirubin: 0.6 mg/dL (ref 0.3–1.2)
Total Protein: 6.7 g/dL (ref 6.5–8.1)

## 2021-03-29 MED ORDER — POLYETHYLENE GLYCOL 3350 17 G PO PACK
17.0000 g | PACK | Freq: Every day | ORAL | Status: DC | PRN
  Administered 2021-03-29 – 2021-03-30 (×2): 17 g via ORAL
  Filled 2021-03-29 (×2): qty 1

## 2021-03-29 MED ORDER — METOPROLOL TARTRATE 12.5 MG HALF TABLET
12.5000 mg | ORAL_TABLET | Freq: Two times a day (BID) | ORAL | Status: DC
  Administered 2021-03-29 – 2021-03-30 (×3): 12.5 mg via ORAL
  Filled 2021-03-29 (×2): qty 1

## 2021-03-29 NOTE — Progress Notes (Signed)
Progress Note  Patient Name: Meagan Oneill Date of Encounter: 03/29/2021  [facility] Cardiologist: Meagan Oneill , MD  Subjective   She reports feeling fast heart rate overnight. Rates jumped to 120s, otherwise mild sinus tachycardia to sinus rhythm  Inpatient Medications    Scheduled Meds:  celecoxib  200 mg Oral BID   Chlorhexidine Gluconate Cloth  6 each Topical Daily   docusate sodium  100 mg Oral BID   rivaroxaban  10 mg Oral Daily   Continuous Infusions:  methocarbamol (ROBAXIN) IV     PRN Meds: acetaminophen, HYDROmorphone (DILAUDID) injection, methocarbamol **OR** methocarbamol (ROBAXIN) IV, metoCLOPramide **OR** metoCLOPramide (REGLAN) injection, ondansetron **OR** ondansetron (ZOFRAN) IV, oxyCODONE   Vital Signs    Vitals:   03/29/21 0103 03/29/21 0328 03/29/21 0524 03/29/21 0839  BP: 135/70 (!) 150/64 133/77 119/72  Pulse: (!) 120 (!) 115 (!) 107 96  Resp: 16 14 16 16   Temp: 98.2 F (36.8 C) 98.8 F (37.1 C)  (!) 97.4 F (36.3 C)  TempSrc:      SpO2: 98% 98% 97% 100%  Weight:        Intake/Output Summary (Last 24 hours) at 03/29/2021 0846 Last data filed at 03/28/2021 2124 Gross per 24 hour  Intake --  Output 0 ml  Net 0 ml   Last 3 Weights 03/25/2021 03/22/2021 02/16/2021  Weight (lbs) 379 lb 379 lb 380 lb  Weight (kg) 171.913 kg 171.913 kg 172.367 kg      Telemetry    Episode of SVT likely AT in the early AM, mild sinus tachycardia, sinus rhythm - Personally Reviewed  ECG    Sinus tachycardia HR 115 bpm - Personally Reviewed  Physical Exam   Vitals:   03/29/21 0524 03/29/21 0839  BP: 133/77 119/72  Pulse: (!) 107 96  Resp: 16 16  Temp:  (!) 97.4 F (36.3 C)  SpO2: 97% 100%     GEN: No acute distress.   Neck: challenging JVD with habitus Cardiac: RRR, no murmurs, rubs, or gallops.  Respiratory: Clear to auscultation bilaterally. GI:  non-distended  MS: Left knee in a brace, legs large but no edema Neuro:  Nonfocal  Psych:  Normal affect   Labs    High Sensitivity Troponin:  No results for input(s): TROPONINIHS in the last 720 hours.   Chemistry Recent Labs  Lab 03/22/21 1304 03/26/21 0254  NA 139 138  K 3.8 3.8  CL 104 104  CO2 27 27  GLUCOSE 119* 123*  BUN 12 8  CREATININE 0.68 0.87  CALCIUM 9.3 8.6*  GFRNONAA >60 >60  ANIONGAP 8 7    Lipids No results for input(s): CHOL, TRIG, HDL, LABVLDL, LDLCALC, CHOLHDL in the last 168 hours.  Hematology Recent Labs  Lab 03/22/21 1304 03/26/21 0254 03/27/21 0431  WBC 8.3 13.3* 9.6  RBC 5.51* 5.09 4.63  HGB 11.4* 10.3* 9.6*  HCT 37.0 34.5* 30.8*  MCV 67.2* 67.8* 66.5*  MCH 20.7* 20.2* 20.7*  MCHC 30.8 29.9* 31.2  RDW 21.2* 21.1* 20.5*  PLT 221 130* 183   Thyroid No results for input(s): TSH, FREET4 in the last 168 hours.  BNPNo results for input(s): BNP, PROBNP in the last 168 hours.  DDimer No results for input(s): DDIMER in the last 168 hours.   Radiology    No results found.  Cardiac Studies  none  Patient Profile     30 y.o. female female with a hx of anemia,asthma, HTN, allergies, who is being seen 03/28/2021 for  the evaluation of palpitations at the request of Meagan Oneill. No cardiac hx. Patient is here s/p L knee ACL , posterolateral corner reconstruction arthroscopy, allograft ligaments for reconstruction, cardiology consulted for palpitations with sinus tachycardia, brief SVT  Assessment & Plan    #Sinus tachycardia/SVT: She had episode of rates in 120's that was brief. Possibly atrial tachycardia. She has sinus tachycardia can be in the setting of pain although does not look uncomfortable. Hgb is 9 , anemia can contribute. No fever or signs of infection. She's not hypotensive. This can be inappropriate sinus tachycardia. Considering she is symptomatic, will start low dose BB - consider iron studies with anemia (mildly worse than normal) - started low dose metop tartrate 12.5 mg BID     For questions or updates, please contact [facility] Please consult [email or web address] for contact info under        Signed, Meagan Fus, MD  03/29/2021, 8:46 AM

## 2021-03-29 NOTE — Progress Notes (Signed)
OT Cancellation Note  Patient Details Name: Meagan Oneill MRN: [id number] DOB: [birth year]   Cancelled Treatment:    Reason Eval/Treat Not Completed: Fatigue/lethargy limiting ability to participate. Pt somnolent on arrival and not easily roused. Will follow up as able.   Hillard Danker, OT/L  Acute Rehab [phone]  Meagan Oneill 03/29/2021, 5:23 PM

## 2021-03-29 NOTE — Final Progress Note (Signed)
Inpatient Rehab Admissions Coordinator:   At this time we are recommending a CIR consult and I will place an order per our protocol so we can complete full assessment.   Estill Dooms, PT, DPT Admissions Coordinator [phone] 03/29/21  12:42 PM

## 2021-03-29 NOTE — Progress Notes (Signed)
IP rehab admissions - I met with patient at the bedside and gave her rehab brochures.  She tells me that this is a workers comp case.  I let case manager know about possible workers comp case.  I will follow up in am with patient.  She is very sleepy right now.  Call for questions.  #[phone] °

## 2021-03-29 NOTE — Progress Notes (Signed)
°  Subjective: Patient is a 30 year old female who is POD 4 s/p left knee multi ligament reconstruction.  She reports that she is doing well today.  Denies any chest pain or shortness of breath.  Palpitations have improved since starting the beta-blocker.  Knee pain is controlled and she was able to stand at bedside today but still no ambulation.  She did not become dizzy or lightheaded when she was standing.  No increasing calf pain.   Objective: Vital signs in last 24 hours: Temp:  [97.4 F (36.3 C)-98.8 F (37.1 C)] 97.4 F (36.3 C) (12/20 0839) Pulse Rate:  [96-120] 96 (12/20 0839) Resp:  [14-16] 16 (12/20 0839) BP: (119-150)/(64-77) 119/72 (12/20 0839) SpO2:  [97 %-100 %] 100 % (12/20 0839)  Intake/Output from previous day: 12/19 0701 - 12/20 0700 In: -  Out: 300 [Urine:300] Intake/Output this shift: No intake/output data recorded.  Exam:  Ortho exam demonstrates left knee with intact postop dressings that are clean and dry.  Suction intact to the wound VAC sponge.  No significant calf tenderness.  Negative Homans' sign.  Intact active dorsiflexion plantarflexion of the operative extremity.  2+ DP pulse of the operative extremity.  Labs: Recent Labs    03/27/21 0431  HGB 9.6*   Recent Labs    03/27/21 0431  WBC 9.6  RBC 4.63  HCT 30.8*  PLT 183   No results for input(s): NA, K, CL, CO2, BUN, CREATININE, GLUCOSE, CALCIUM in the last 72 hours. No results for input(s): LABPT, INR in the last 72 hours.  Assessment/Plan: Patient is a 30 year old female who is POD 4 s/p left knee multi ligament reconstruction.  Appreciate cardiology input on [place].  Palpitations have improved since starting beta-blocker..  She has not been able to ambulate at all but she has been able to stand at bedside 2 days in a row.  Awaiting decision on CIR possibility and if this is not possible we will plan to press on with disposition for skilled nursing facility.   Joycie Peek Aastha Dayley 03/29/2021,  8:39 PM

## 2021-03-29 NOTE — Progress Notes (Signed)
Physical Therapy Treatment Patient Details Name: Meagan Oneill MRN: [id number] DOB: 13-Jan-1991 Today's Date: 03/29/2021   History of Present Illness Pt. is 30 yr old F admitted on 12/16 admitted for planned surgery to correct multiligament knee injury that occured 6 weeks ago.  Sx included L ACL, PCL, popliteal fibular ligament, lateral collateral ligament, and laternal meniscus root repair. PMH: anemia, asthma, HTN    PT Comments    Pt demos improved activity tolerance and strength and is able to initiate standing from EOB x 2 with mod A for safety.  Pt demos greatest difficulty with transitioning Ue's from bed to RW with inc R lateral lean/poor balance noted.  Transfers not attempted at this time as pt is continuing to require elevated bed height to complete transition and is fearful of transferring into chair and not being able to get back up.  PT to work with pt on building confidence with sit > stand from lower bed height prior to transfer to chair, or work towards transfer to elevated BSC.  Pt remains excellent candidate for CIR at this time as she is highly motivated, young, PLOF independent and has family support at D/C.   Recommendations for follow up therapy are one component of a multi-disciplinary discharge planning process, led by the attending physician.  Recommendations may be updated based on patient status, additional functional criteria and insurance authorization.  Follow Up Recommendations  Acute inpatient rehab (3hours/day)     Assistance Recommended at Discharge Frequent or constant Supervision/Assistance  Equipment Recommendations  Rolling walker (2 wheels);BSC/3in1;Wheelchair (measurements PT)    Recommendations for Other Services       Precautions / Restrictions Precautions Precautions: Fall Required Braces or Orthoses: Knee Immobilizer - Left Knee Immobilizer - Left: On at all times Restrictions Weight Bearing Restrictions: Yes LLE Weight Bearing: Non weight  bearing     Mobility  Bed Mobility Overal bed mobility: Needs Assistance Bed Mobility: Supine to Sit     Supine to sit: Min assist Sit to supine: Min assist   General bed mobility comments: Pt. transfers from sup > sit and BTB with min A only for L LE negotiation.  Pt. uses handrails to assist.  Also able to scoot up in bed with min A only for PT to unweight L LE. Patient Response: Cooperative  Transfers Overall transfer level: Needs assistance Equipment used: Rolling walker (2 wheels) Transfers: Sit to/from Stand Sit to Stand: Mod assist           General transfer comment: Pt transfers from sit > stand x 2 with mod A and use of RW.  VCs for safety with hand placement.  Pt. unable to hold L LE up, so PT holds LE with one hand and blocks R knee with other hand.  Pt. demos greatest difficulty with transferring  LUE from bedrail up to RW with heavy R sided lean/poor balance.  On 2nd attempt, PT requests that pt. attempt to leave L LE out in front with R sided weight shift and try to stand without PT holding LE, but pt. is unable to complete and 2nd sit > stand is performed in same manner as 1st.  Pt. performs all activity from raised bed height.  Transfer to chair not attempted at this time as pt is fearful of attempting to stand from low surface level.    Ambulation/Gait                   Stairs  Wheelchair Mobility    Modified Rankin (Stroke Patients Only)       Balance Overall balance assessment: Needs assistance Sitting-balance support: Bilateral upper extremity supported;Feet supported Sitting balance-Leahy Scale: Fair     Standing balance support: Bilateral upper extremity supported;During functional activity Standing balance-Leahy Scale: Poor                              Cognition Arousal/Alertness: Awake/alert Behavior During Therapy: WFL for tasks assessed/performed Overall Cognitive Status: Within Functional Limits for  tasks assessed                                 General Comments: Pt. is supine in bed when PT arrives.  Agreeable to work on standing.  States she just received her pain meds.        Exercises Total Joint Exercises Ankle Circles/Pumps: AROM;Both;20 reps Hip ABduction/ADduction: AAROM;Left;20 reps Long Arc Quad: AAROM;Left;20 reps    General Comments        Pertinent Vitals/Pain Pain Assessment: 0-10 Pain Score: 5  Pain Descriptors / Indicators: Aching;Burning;Discomfort Pain Intervention(s): Monitored during session    Home Living                          Prior Function            PT Goals (current goals can now be found in the care plan section) Progress towards PT goals: Progressing toward goals    Frequency    Min 5X/week      PT Plan Current plan remains appropriate    Co-evaluation              AM-PAC PT "6 Clicks" Mobility   Outcome Measure  Help needed turning from your back to your side while in a flat bed without using bedrails?: A Little Help needed moving from lying on your back to sitting on the side of a flat bed without using bedrails?: A Little Help needed moving to and from a bed to a chair (including a wheelchair)?: A Lot Help needed standing up from a chair using your arms (e.g., wheelchair or bedside chair)?: A Lot Help needed to walk in hospital room?: Total Help needed climbing 3-5 steps with a railing? : Total 6 Click Score: 12    End of Session   Activity Tolerance: Patient tolerated treatment well;Patient limited by pain Patient left: in bed;with call bell/phone within reach Nurse Communication: Mobility status       Time: 1194-1740 PT Time Calculation (min) (ACUTE ONLY): 36 min  Charges:  $Therapeutic Exercise: 8-22 mins $Therapeutic Activity: 23-37 mins                     Christl Fessenden A. Miamarie Moll, PT, DPT Acute Rehabilitation Services Office: [phone]    Zonia Caplin A  Bronwen Pendergraft 03/29/2021, 9:48 AM

## 2021-03-29 NOTE — Progress Notes (Signed)
Around 1am pt developed chest pain. Described as "sharp" and "on-going", 5/10. VS were  T98.2 BP 135/70 HR 120 RR16 O2 98% on room air. Last pain med was given at 2151.  Dr. Frazier Butt was notified. ECG was done, per pt she "feels better." Pt is on continuous pulse ox and tele monitoring. No orders per MD at this moment.

## 2021-03-29 NOTE — Plan of Care (Signed)
  Problem: Coping: Goal: Level of anxiety will decrease Outcome: Progressing   Problem: Safety: Goal: Ability to remain free from injury will improve Outcome: Progressing   Problem: Skin Integrity: Goal: Risk for impaired skin integrity will decrease Outcome: Progressing   

## 2021-03-29 NOTE — Plan of Care (Signed)
°  Problem: Education: Goal: Knowledge of General Education information will improve Description: Including pain rating scale, medication(s)/side effects and non-pharmacologic comfort measures Outcome: Progressing   Problem: Health Behavior/Discharge Planning: Goal: Ability to manage health-related needs will improve Outcome: Progressing   Problem: Clinical Measurements: Goal: Ability to maintain clinical measurements within normal limits will improve Outcome: Progressing Goal: Will remain free from infection Outcome: Progressing   Problem: Activity: Goal: Risk for activity intolerance will decrease Outcome: Progressing   Problem: Coping: Goal: Level of anxiety will decrease Outcome: Progressing   Problem: Elimination: Goal: Will not experience complications related to bowel motility Outcome: Progressing   Problem: Safety: Goal: Ability to remain free from injury will improve Outcome: Progressing   Problem: Skin Integrity: Goal: Risk for impaired skin integrity will decrease Outcome: Progressing

## 2021-03-30 DIAGNOSIS — I4729 Other ventricular tachycardia: Secondary | ICD-10-CM

## 2021-03-30 MED ORDER — FLEET ENEMA 7-19 GM/118ML RE ENEM
1.0000 | ENEMA | Freq: Once | RECTAL | Status: AC
  Administered 2021-03-30: 08:00:00 1 via RECTAL
  Filled 2021-03-30: qty 1

## 2021-03-30 MED ORDER — METOPROLOL TARTRATE 25 MG PO TABS
25.0000 mg | ORAL_TABLET | Freq: Two times a day (BID) | ORAL | Status: DC
  Administered 2021-03-30 – 2021-04-05 (×12): 25 mg via ORAL
  Filled 2021-03-30 (×12): qty 1

## 2021-03-30 NOTE — Progress Notes (Signed)
IP rehab admissions - I met with patient and her sister at the bedside.  I discussed inpatient rehab versus other rehab options with patient.  Patient would like inpatient rehab stay and then plans home with her mother.  Need to determine if patient has workers comp or [employer] or uninsured.  I will work on this issue.  Call for questions.  [phone]

## 2021-03-30 NOTE — Progress Notes (Signed)
Progress Note  Patient Name: Meagan Oneill Date of Encounter: 03/30/2021  [facility] Cardiologist: Carolan Clines , MD  Subjective   She says she did not feel palpitations yesterday. She had an episode of being anxious.  Inpatient Medications    Scheduled Meds:  celecoxib  200 mg Oral BID   Chlorhexidine Gluconate Cloth  6 each Topical Daily   docusate sodium  100 mg Oral BID   metoprolol tartrate  12.5 mg Oral BID   rivaroxaban  10 mg Oral Daily   Continuous Infusions:  methocarbamol (ROBAXIN) IV     PRN Meds: acetaminophen, HYDROmorphone (DILAUDID) injection, methocarbamol **OR** methocarbamol (ROBAXIN) IV, metoCLOPramide **OR** metoCLOPramide (REGLAN) injection, ondansetron **OR** ondansetron (ZOFRAN) IV, oxyCODONE, polyethylene glycol   Vital Signs    Vitals:   03/29/21 0839 03/29/21 2100 03/30/21 0730 03/30/21 0810  BP: 119/72 124/68  (!) 149/106  Pulse: 96 98  90  Resp: 16 16  17   Temp: (!) 97.4 F (36.3 C) 98.3 F (36.8 C)  (!) 97.3 F (36.3 C)  TempSrc:  Oral  Oral  SpO2: 100% 100%  100%  Weight:   (!) 172 kg   Height:   5\' 3"  (1.6 m)     Intake/Output Summary (Last 24 hours) at 03/30/2021 1113 Last data filed at 03/30/2021 0900 Gross per 24 hour  Intake 1080 ml  Output 2300 ml  Net -1220 ml   Last 3 Weights 03/30/2021 03/25/2021 03/22/2021  Weight (lbs) 379 lb 3.1 oz 379 lb 379 lb  Weight (kg) 172 kg 171.913 kg 171.913 kg      Telemetry    Sinus rhythm to mild sinus tachycardia, brief rates 130s - Personally Reviewed  ECG     No new-Personally Reviewed  Physical Exam   Vitals:   03/29/21 2100 03/30/21 0810  BP: 124/68 (!) 149/106  Pulse: 98 90  Resp: 16 17  Temp: 98.3 F (36.8 C) (!) 97.3 F (36.3 C)  SpO2: 100% 100%     GEN: No acute distress.  Sitting up in a chair. Morbidly obese Neck: challenging JVD with habitus Cardiac: RRR, no murmurs, rubs, or gallops.  Respiratory: Clear to auscultation bilaterally. GI:   non-distended  MS: Left knee in a brace, legs large but no edema Neuro:  Nonfocal  Psych: Normal affect   Labs    High Sensitivity Troponin:  No results for input(s): TROPONINIHS in the last 720 hours.   Chemistry Recent Labs  Lab 03/26/21 0254 03/29/21 2138  NA 138 135  K 3.8 3.9  CL 104 101  CO2 27 27  GLUCOSE 123* 97  BUN 8 9  CREATININE 0.87 0.61  CALCIUM 8.6* 8.7*  PROT  --  6.7  ALBUMIN  --  2.5*  AST  --  17  ALT  --  15  ALKPHOS  --  55  BILITOT  --  0.6  GFRNONAA >60 >60  ANIONGAP 7 7    Lipids No results for input(s): CHOL, TRIG, HDL, LABVLDL, LDLCALC, CHOLHDL in the last 168 hours.  Hematology Recent Labs  Lab 03/26/21 0254 03/27/21 0431 03/29/21 2138  WBC 13.3* 9.6 10.4  RBC 5.09 4.63 4.84  HGB 10.3* 9.6* 9.8*  HCT 34.5* 30.8* 32.6*  MCV 67.8* 66.5* 67.4*  MCH 20.2* 20.7* 20.2*  MCHC 29.9* 31.2 30.1  RDW 21.1* 20.5* 20.7*  PLT 130* 183 241   Thyroid No results for input(s): TSH, FREET4 in the last 168 hours.  BNPNo results for input(s): BNP,  PROBNP in the last 168 hours.  DDimer No results for input(s): DDIMER in the last 168 hours.   Radiology    No results found.  Cardiac Studies  none  Patient Profile     30 y.o. female female with a hx of anemia,asthma, HTN, allergies, who is being seen 03/28/2021 for the evaluation of palpitations at the request of Dr August Saucer. No cardiac hx. Patient is here s/p L knee ACL , posterolateral corner reconstruction arthroscopy, allograft ligaments for reconstruction, cardiology consulted for palpitations with sinus tachycardia, brief SVT  Assessment & Plan    #Sinus tachycardia/SVT: She had episode of rates in 120's that was brief. Possibly atrial tachycardia. She has sinus tachycardia can be in the setting of pain although does not look uncomfortable. Hgb is 9 , anemia can contribute. No fever or signs of infection. She's not hypotensive. This can be inappropriate sinus tachycardia. Considering she was  symptomatic, started BB. - consider iron studies with anemia (mildly worse than normal) - will increase metop tartrate 25 mg BID - can be discharged with these heart rates. Once she goes through rehab and less deconditioned rates may come down.  - she has follow up with me 04/29/2021  -cardiology will sign off. Don't hesitate to reach out for further questions.  For questions or updates, please contact [facility] Please consult [email or web address] for contact info under        Signed, Maisie Fus, MD  03/30/2021, 11:13 AM

## 2021-03-30 NOTE — Progress Notes (Signed)
Staff has made multiple attempts to motivate and encourage pt to become more mobile-educating on possible issues that can come from lack of mobility. Offered options to using bedpans: such as raised BSC, using steady for transfers.

## 2021-03-30 NOTE — Progress Notes (Signed)
Pt states she was injured @ [facility] Nov.7 while working.  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 NCM called pt's  attorney Devona Konig Farrin @ [phone] to obtain claim information surrounding Workmans Comp case. Called transfer to Sara Chu ( paralegal), voice message left...awaiting call back. TOC team will continue to monitor and assist with needs... Gae Gallop RN,BSN,CM [phone]

## 2021-03-30 NOTE — Progress Notes (Signed)
°  Subjective: Pt stable pain ok   Objective: Vital signs in last 24 hours: Temp:  [97.4 F (36.3 C)-98.3 F (36.8 C)] 98.3 F (36.8 C) (12/20 2100) Pulse Rate:  [96-98] 98 (12/20 2100) Resp:  [16] 16 (12/20 2100) BP: (119-124)/(68-72) 124/68 (12/20 2100) SpO2:  [100 %] 100 % (12/20 2100) Weight:  [172 kg] 172 kg (12/21 0730)  Intake/Output from previous day: 12/20 0701 - 12/21 0700 In: 840 [P.O.:840] Out: 2700 [Urine:2700] Intake/Output this shift: No intake/output data recorded.  Exam:  Sensation intact distally Dorsiflexion/Plantar flexion intact  Labs: Recent Labs    03/29/21 2138  HGB 9.8*   Recent Labs    03/29/21 2138  WBC 10.4  RBC 4.84  HCT 32.6*  PLT 241   Recent Labs    03/29/21 2138  NA 135  K 3.9  CL 101  CO2 27  BUN 9  CREATININE 0.61  GLUCOSE 97  CALCIUM 8.7*   No results for input(s): LABPT, INR in the last 72 hours.  Assessment/Plan: Plan for [facility] tomorrow Cpm to start sat left leg Xarelto for dvt prophylaxis Thx to cards for beta blocker and recs Awaiting dispo from [facility]   [employer] 03/30/2021, 7:56 AM

## 2021-03-30 NOTE — Progress Notes (Signed)
Physical Therapy Treatment Patient Details Name: Meagan Oneill MRN: [id number] DOB: 05-Nov-1990 Today's Date: 03/30/2021   History of Present Illness Pt. is 30 yr old F admitted on 12/16 admitted for planned surgery to correct multiligament knee injury that occured 6 weeks ago.  Sx included L ACL, PCL, popliteal fibular ligament, lateral collateral ligament, and laternal meniscus root repair. PMH: anemia, asthma, HTN    PT Comments    Pt admitted with above diagnosis. Pt was able to transfer to the recliner with the arm dropped laterally with mod assist of 2 persons for safety.  Then practiced sit to stand with pt needing min assist of 2 to stand and once up mod assist of 2 to maintain as pt needs constant assist to hold the left LE off the floor.  Pt with more confidence today and was able to stay up in chair. Pt currently with functional limitations due to balance and endurance deficits. Pt will benefit from skilled PT to increase their independence and safety with mobility to allow discharge to the venue listed below.      Recommendations for follow up therapy are one component of a multi-disciplinary discharge planning process, led by the attending physician.  Recommendations may be updated based on patient status, additional functional criteria and insurance authorization.  Follow Up Recommendations  Acute inpatient rehab (3hours/day)     Assistance Recommended at Discharge Frequent or constant Supervision/Assistance  Equipment Recommendations  Rolling walker (2 wheels);BSC/3in1;Wheelchair (measurements PT)    Recommendations for Other Services       Precautions / Restrictions Precautions Precautions: Fall Required Braces or Orthoses: Knee Immobilizer - Left Knee Immobilizer - Left: On at all times Restrictions Weight Bearing Restrictions: Yes LLE Weight Bearing: Non weight bearing     Mobility  Bed Mobility Overal bed mobility: Needs Assistance Bed Mobility: Supine to Sit      Supine to sit: Min assist     General bed mobility comments: Pt. transfers from sup > sit and BTB with min A only for L LE negotiation.  Pt. uses handrails to assist.    Transfers Overall transfer level: Needs assistance Equipment used: Rolling walker (2 wheels) Transfers: Sit to/from Stand;Bed to chair/wheelchair/BSC Sit to Stand: Min assist;+2 physical assistance;+2 safety/equipment          Lateral/Scoot Transfers: Mod assist;+2 physical assistance;+2 safety/equipment General transfer comment: Pt scooted laterally to the drop arm recliner with +2 mod assist for safety. Pt needed cues for safety and PT was holding the left LE off the floor as she cannot maintain NWB on her own. Pt transfered from sit > stand x  with min A of 2 and use of RW.  mod VCs for safety with hand placement.  Pt. needed assist  to hold L LE up, so PT holds LE with her foot.  Pt was able to take one hop forward but needed help to not put too much weight on the left LE therefore brought chair to pt for her to sit.    Ambulation/Gait                   Stairs             Wheelchair Mobility    Modified Rankin (Stroke Patients Only)       Balance Overall balance assessment: Needs assistance Sitting-balance support: Bilateral upper extremity supported;Feet supported Sitting balance-Leahy Scale: Fair     Standing balance support: Bilateral upper extremity supported;During functional activity Standing balance-Leahy Scale: Poor  Standing balance comment: relies on UE support for balance                            Cognition Arousal/Alertness: Awake/alert Behavior During Therapy: WFL for tasks assessed/performed Overall Cognitive Status: Within Functional Limits for tasks assessed                                 General Comments: Pt. is supine in bed when PT arrives.  Agreeable to work on standing.  States she just received her pain meds.        Exercises  Total Joint Exercises Ankle Circles/Pumps: AROM;Both;20 reps Hip ABduction/ADduction: AAROM;Left;20 reps Long Arc Quad: AAROM;Left;20 reps General Exercises - Lower Extremity Ankle Circles/Pumps: AROM;Both;10 reps;Supine Gluteal Sets: AROM;Both;10 reps;Supine Heel Slides: AROM;Right;10 reps;Supine    General Comments General comments (skin integrity, edema, etc.): VSS      Pertinent Vitals/Pain Pain Assessment: Faces Faces Pain Scale: Hurts even more Pain Location: L knee Pain Descriptors / Indicators: Aching;Burning;Discomfort Pain Intervention(s): Limited activity within patient's tolerance;Monitored during session;Repositioned    Home Living                          Prior Function            PT Goals (current goals can now be found in the care plan section) Acute Rehab PT Goals Patient Stated Goal: home Progress towards PT goals: Progressing toward goals    Frequency    Min 5X/week      PT Plan Current plan remains appropriate    Co-evaluation PT/OT/SLP Co-Evaluation/Treatment: Yes Reason for Co-Treatment: Complexity of the patient's impairments (multi-system involvement);For patient/therapist safety PT goals addressed during session: Mobility/safety with mobility        AM-PAC PT "6 Clicks" Mobility   Outcome Measure  Help needed turning from your back to your side while in a flat bed without using bedrails?: A Little Help needed moving from lying on your back to sitting on the side of a flat bed without using bedrails?: A Little Help needed moving to and from a bed to a chair (including a wheelchair)?: A Lot Help needed standing up from a chair using your arms (e.g., wheelchair or bedside chair)?: A Lot Help needed to walk in hospital room?: Total Help needed climbing 3-5 steps with a railing? : Total 6 Click Score: 12    End of Session Equipment Utilized During Treatment: Gait belt Activity Tolerance: Patient tolerated treatment  well;Patient limited by pain Patient left: with call bell/phone within reach;in chair;with chair alarm set Nurse Communication: Mobility status PT Visit Diagnosis: History of falling (Z91.81);Pain;Other abnormalities of gait and mobility (R26.89) Pain - Right/Left: Left Pain - part of body: Knee     Time: [id number] PT Time Calculation (min) (ACUTE ONLY): 34 min  Charges:  $Therapeutic Activity: 8-22 mins                     Leighann Amadon M,PT Acute Rehab Services [phone] (pager)    Alvira Philips 03/30/2021, 12:23 PM

## 2021-03-30 NOTE — Progress Notes (Signed)
Patient was crying when RN came into the room. Pt verbalized that her aunt just died. Pt feels remorse and expresses concern for her mother and that she should be there to support her on this difficult time. Chaplain consulted.

## 2021-03-30 NOTE — Progress Notes (Signed)
Occupational Therapy Treatment Patient Details Name: Meagan Oneill MRN: [id number] DOB: [birth year] Today's Date: 03/30/2021   History of present illness Pt. is 30 yr old F admitted on 12/16 admitted for planned surgery to correct multiligament knee injury that occured 6 weeks ago.  Sx included L ACL, PCL, popliteal fibular ligament, lateral collateral ligament, and laternal meniscus root repair. PMH: anemia, asthma, HTN   OT comments  Pt is progressing towards OT goals. During session, pt simulated toilet transfer with lateral scoot from EOB to drop arm recliner. However, stood to RW from recliner and practiced hopping while maintaining NWB, performing well with assistance. Could likely practice pivot transfers next session to reduce need for drop arm. Continue to recommend AIR. Will continue to follow.   Recommendations for follow up therapy are one component of a multi-disciplinary discharge planning process, led by the attending physician.  Recommendations may be updated based on patient status, additional functional criteria and insurance authorization.    Follow Up Recommendations  Acute inpatient rehab (3hours/day)    Assistance Recommended at Discharge Frequent or constant Supervision/Assistance  Equipment Recommendations  BSC/3in1;Tub/shower bench;Wheelchair (measurements OT);Wheelchair cushion (measurements OT);Hospital bed    Recommendations for Other Services      Precautions / Restrictions Precautions Precautions: Fall Required Braces or Orthoses: Knee Immobilizer - Left Knee Immobilizer - Left: On at all times Restrictions Weight Bearing Restrictions: Yes LLE Weight Bearing: Non weight bearing       Mobility Bed Mobility Overal bed mobility: Needs Assistance Bed Mobility: Supine to Sit     Supine to sit: Min assist          Transfers Overall transfer level: Needs assistance Equipment used: Rolling walker (2 wheels) Transfers: Sit to/from Stand;Bed to  chair/wheelchair/BSC Sit to Stand: Min assist;+2 physical assistance;+2 safety/equipment          Lateral/Scoot Transfers: Mod assist;+2 physical assistance;+2 safety/equipment       Balance Overall balance assessment: Needs assistance Sitting-balance support: Bilateral upper extremity supported;Feet supported Sitting balance-Leahy Scale: Fair     Standing balance support: Bilateral upper extremity supported;During functional activity Standing balance-Leahy Scale: Poor                             ADL either performed or assessed with clinical judgement   ADL Overall ADL's : Needs assistance/impaired                         Toilet Transfer: Moderate assistance;+2 for physical assistance;+2 for safety/equipment;BSC/3in1;Requires drop arm Toilet Transfer Details (indicate cue type and reason): Simulated with lateral scoot from EOB to drop arm recliner. However, stood to RW from recliner and practiced hopping while maintaining NWB, performing well with assistance. Could likely practice pivot transfers next session without need for drop arm.                Extremity/Trunk Assessment              Vision       Perception     Praxis      Cognition Arousal/Alertness: Awake/alert Behavior During Therapy: WFL for tasks assessed/performed Overall Cognitive Status: Within Functional Limits for tasks assessed                                            Exercises  Shoulder Instructions       General Comments      Pertinent Vitals/ Pain       Pain Assessment: Faces Faces Pain Scale: Hurts even more Pain Location: L knee Pain Descriptors / Indicators: Aching;Burning;Discomfort Pain Intervention(s): Monitored during session;Repositioned  Home Living                                          Prior Functioning/Environment              Frequency  Min 2X/week        Progress Toward Goals  OT  Goals(current goals can now be found in the care plan section)  Progress towards OT goals: Progressing toward goals  Acute Rehab OT Goals Patient Stated Goal: return to PLOF OT Goal Formulation: With patient Time For Goal Achievement: 04/10/21 Potential to Achieve Goals: Good ADL Goals Pt Will Perform Lower Body Bathing: with min guard assist;with adaptive equipment;sitting/lateral leans Pt Will Perform Lower Body Dressing: with min guard assist;with adaptive equipment Pt Will Transfer to Toilet: with min guard assist;stand pivot transfer;bedside commode Pt Will Perform Toileting - Clothing Manipulation and hygiene: with min guard assist  Plan Discharge plan remains appropriate;Frequency remains appropriate    Co-evaluation                 AM-PAC OT "6 Clicks" Daily Activity     Outcome Measure   Help from another person eating meals?: None Help from another person taking care of personal grooming?: A Little Help from another person toileting, which includes using toliet, bedpan, or urinal?: Total Help from another person bathing (including washing, rinsing, drying)?: A Lot Help from another person to put on and taking off regular upper body clothing?: A Little Help from another person to put on and taking off regular lower body clothing?: A Lot 6 Click Score: 15    End of Session Equipment Utilized During Treatment: Left knee immobilizer  OT Visit Diagnosis: Other abnormalities of gait and mobility (R26.89);Pain Pain - Right/Left: Left Pain - part of body: Knee   Activity Tolerance Patient limited by pain   Patient Left in chair;with chair alarm set;with call bell/phone within reach   Nurse Communication Mobility status        Time: 1829-9371 OT Time Calculation (min): 22 min  Charges: OT General Charges $OT Visit: 1 Visit OT Treatments $Therapeutic Activity: 8-22 mins  Meagan Oneill, OT/L  Acute Rehab [phone]  Lenice Llamas 03/30/2021, 10:26 AM

## 2021-03-30 NOTE — PMR Pre-admission (Signed)
PMR Admission Coordinator Pre-Admission Assessment  Patient: Meagan Oneill is an 30 y.o., female MRN: [id number] DOB: 1991-01-05 Height: _0  (160 cm) Weight: (!) 172 kg  Insurance Information HMO:     PPO:      PCP:      IPA:      80/20:      OTHER:  PRIMARY: none        SECONDARY:       Policy#:      Phone#:   Development worker, community:       Phone#:   The Actuary for patients in Inpatient Rehabilitation Facilities with attached Privacy Act Marceline Records was provided and verbally reviewed with: N/A  Emergency Contact Information Contact Information     Name Relation Home Work Mobile   Valmy Mother [phone]  [phone]   Lindwood Coke [phone]  [phone]       Current Medical History  Patient Admitting Diagnosis: L knee ACL reconstruction  History of Present Illness: Meagan Santmyer is a 30 year old right-handed female with unremarkable past medical history except obesity with BMI 67.17.  Per chart review lives with parent who physically cannot provide assist.  1 level home.  Per chart review patient works as a Quarry manager.  Approximately 6 weeks ago patient sustained a low-energy knee dislocation while at work.  MRI showed injury to the ACL with PCL femoral avulsion of the lateral sided injury.  Presented 03/25/2021 with persistent increasing knee pain.  Patient underwent arthroscopic PCL reconstruction/arthroscopic ACL reconstruction using bone patellar tendon bone allograft/lateral posterior horn medial meniscal root repair.  Posterior lateral corner reconstruction with popliteal fibular ligament reconstruction and lateral collateral ligament reconstruction with peroneal nerve neurolysis 03/26/2021 per Dr. Marcene Duos.  Knee immobilizer at all times nonweightbearing left lower extremity.  Maintained on Xarelto for DVT prophylaxis.  Acute blood loss anemia 9.6 and monitored.  Hospital course dysuria urinalysis WBC 11-20,000 few  bacteria placed on Keflex 04/04/2021 empirically.  Therapy evaluations completed due to patient decreased functional mobility was admitted for a comprehensive rehab program.   Patient's medical record from [facility] has been reviewed by the rehabilitation admission coordinator and physician.  Past Medical History  Past Medical History:  Diagnosis Date   Anemia    Asthma    Environmental allergies    Hypertension     Has the patient had major surgery during 100 days prior to admission? Yes  Family History   family history includes Diabetes in her mother; Hypertension in her mother and sister.  Current Medications  Current Facility-Administered Medications:    acetaminophen (TYLENOL) tablet 325-650 mg, 325-650 mg, Oral, Q6H PRN, Magnant, Charles L, PA-C, 650 mg at 04/04/21 1048   celecoxib (CELEBREX) capsule 200 mg, 200 mg, Oral, BID, Magnant, Charles L, PA-C, 200 mg at 04/05/21 5625   cephALEXin (KEFLEX) capsule 500 mg, 500 mg, Oral, Q6H, Jessy Oto, MD, 500 mg at 04/05/21 6389   Chlorhexidine Gluconate Cloth 2 % PADS 6 each, 6 each, Topical, Daily, Marlou [place], MD, 6 each at 04/05/21 3734   docusate sodium (COLACE) capsule 100 mg, 100 mg, Oral, BID, Magnant, Charles L, PA-C, 100 mg at 04/03/21 2876   HYDROmorphone (DILAUDID) injection 0.5-1 mg, 0.5-1 mg, Intravenous, Q4H PRN, Magnant, Charles L, PA-C, 1 mg at 03/26/21 0851   methocarbamol (ROBAXIN) tablet 500 mg, 500 mg, Oral, Q6H PRN, 500 mg at 04/03/21 1610 **OR** methocarbamol (ROBAXIN) 500 mg in dextrose 5 % 50 mL IVPB, 500 mg,  Intravenous, Q6H PRN, Magnant, Charles L, PA-C   metoCLOPramide (REGLAN) tablet 5-10 mg, 5-10 mg, Oral, Q8H PRN **OR** metoCLOPramide (REGLAN) injection 5-10 mg, 5-10 mg, Intravenous, Q8H PRN, Magnant, Charles L, PA-C   metoprolol tartrate (LOPRESSOR) tablet 25 mg, 25 mg, Oral, BID, Branch, Mary E, MD, 25 mg at 04/05/21 0921   ondansetron (ZOFRAN) tablet 4 mg, 4 mg, Oral, Q6H PRN **OR**  ondansetron (ZOFRAN) injection 4 mg, 4 mg, Intravenous, Q6H PRN, Magnant, Charles L, PA-C   oxyCODONE (Oxy IR/ROXICODONE) immediate release tablet 5-10 mg, 5-10 mg, Oral, Q4H PRN, Magnant, Charles L, PA-C, 5 mg at 04/01/21 0841   polyethylene glycol (MIRALAX / GLYCOLAX) packet 17 g, 17 g, Oral, Daily PRN, Meredith Pel, MD, 17 g at 03/30/21 0941   rivaroxaban (XARELTO) tablet 10 mg, 10 mg, Oral, Daily, Paytes, Austin A, RPH, 10 mg at 04/05/21 9470  Patients Current Diet:  Diet Order             Diet regular Room service appropriate? Yes; Fluid consistency: Thin  Diet effective now                   Precautions / Restrictions Precautions Precautions: Fall Restrictions Weight Bearing Restrictions: Yes LLE Weight Bearing: Non weight bearing   Has the patient had 2 or more falls or a fall with injury in the past year? Yes  Prior Activity Level Community (5-7x/wk): Was working [employer] and went out daily  Prior Functional Level Self Care: Did the patient need help bathing, dressing, using the toilet or eating? Independent  Indoor Mobility: Did the patient need assistance with walking from room to room (with or without device)? Needed some help  Stairs: Did the patient need assistance with internal or external stairs (with or without device)? Needed some help  Functional Cognition: Did the patient need help planning regular tasks such as shopping or remembering to take medications? Independent  Patient Information Are you of Hispanic, Latino/a,or Spanish origin?: A. No, not of Hispanic, Latino/a, or Spanish origin What is your race?: B. Black or African American Do you need or want an interpreter to communicate with a doctor or health care staff?: 0. No  Patient's Response To:  Health Literacy and Transportation Is the patient able to respond to health literacy and transportation needs?: Yes Health Literacy - How often do you need to have someone help you when you read  instructions, pamphlets, or other written material from your doctor or pharmacy?: Never In the past 12 months, has lack of transportation kept you from medical appointments or from getting medications?: No In the past 12 months, has lack of transportation kept you from meetings, work, or from getting things needed for daily living?: No  Development worker, international aid / [place] Devices/Equipment: None Home Equipment: Conservation officer, nature (2 wheels), Shower seat  Prior Device Use: Indicate devices/aids used by the patient prior to current illness, exacerbation or injury? None of the above.  Did use a wheel chair in the home most recently.  Current Functional Level Cognition  Overall Cognitive Status: Within Functional Limits for tasks assessed Orientation Level: Oriented X4 General Comments: Pt tearful in chair on arrival requesting back to bed due to pain.    Extremity Assessment (includes Sensation/Coordination)  Upper Extremity Assessment: Overall WFL for tasks assessed  Lower Extremity Assessment: Defer to PT evaluation LLE Deficits / Details: Grossly 1/5 LLE: Unable to fully assess due to pain    ADLs  Overall ADL's : Needs  assistance/impaired Eating/Feeding: Independent, Sitting Grooming: Wash/dry face, Wash/dry hands, Standing, Min guard Upper Body Bathing: Set up, Supervision/ safety, Sitting Upper Body Bathing Details (indicate cue type and reason): simulated Lower Body Bathing: Maximal assistance, Sitting/lateral leans Upper Body Dressing : Set up, Sitting Lower Body Dressing: Maximal assistance, Sitting/lateral leans Toilet Transfer: Minimal assistance, Min guard, Ambulation, Rolling walker (2 wheels), Regular Toilet, Grab bars Toilet Transfer Details (indicate cue type and reason): awaiting bari [facility] Toileting- Clothing Manipulation and Hygiene: Moderate assistance, Sit to/from stand Functional mobility during ADLs: Minimal assistance, Min guard, Rolling walker (2  wheels)    Mobility  Overal bed mobility: Needs Assistance Bed Mobility: Supine to Sit, Sit to Supine Rolling: Max assist Supine to sit: Min assist, HOB elevated Sit to supine: Min guard General bed mobility comments: Min A to move LLE to EOB, able to bring LLE into bed to return to supine.    Transfers  Overall transfer level: Needs assistance Equipment used: Rolling walker (2 wheels) Transfers: Sit to/from Stand Sit to Stand: Min assist Bed to/from chair/wheelchair/BSC transfer type:: Step pivot Stand pivot transfers: Min assist Step pivot transfers: Min assist  Lateral/Scoot Transfers: Mod assist, +2 physical assistance, +2 safety/equipment General transfer comment: Min A to steady in standing, stood from EOB x2, from wheelchair x1, SPT bed to/from w/c x1. Pt hypermobile in right knee.    Ambulation / Gait / Stairs / Wheelchair Mobility  Ambulation/Gait Ambulation/Gait assistance: Min guard, Herbalist (Feet): 20 Feet Assistive device: Rolling walker (2 wheels) Gait Pattern/deviations: Step-to pattern General Gait Details: Hop to gait with use of RW, compliant with NWb LLE. Fatigues quickly. 1 LOB when turning need Min A for support. Heavy reliance on UEs. Gait velocity: decreased Gait velocity interpretation: <1.31 ft/sec, indicative of household Engineer, mining mobility: Yes Wheelchair propulsion: Both upper extremities Wheelchair parts: Supervision/cueing Distance: 110 Wheelchair Assistance Details (indicate cue type and reason): Fatigues needing a few rest breaks.    Posture / Balance Balance Overall balance assessment: Needs assistance Sitting-balance support: Feet supported, No upper extremity supported Sitting balance-Leahy Scale: Fair Standing balance support: During functional activity, Reliant on assistive device for balance Standing balance-Leahy Scale: Poor Standing balance comment: relies on UE support for balance     Special needs/care consideration Continuous Drip IV  KVO, Wound Vac Yes to Left knee, and Skin L post op knee incision with dressing and knee immobilizer   Previous Home Environment (from acute therapy documentation) Living Arrangements: Parent Available Help at Discharge: Family Type of Home: House Home Layout: One level Home Access: Level entry Bathroom Shower/Tub: Chiropodist: [place]: No  Discharge Living Setting Plans for Discharge Living Setting: Lives with (comment), Apartment (Will go home with her mother.) Type of Home at Discharge: Apartment Discharge Home Layout: One level Discharge Home Access: Level entry Discharge Bathroom Shower/Tub: Tub/shower unit, Curtain Discharge Bathroom Toilet: Handicapped height Discharge Bathroom Accessibility: Yes How Accessible: Accessible via walker Does the patient have any problems obtaining your medications?: No  Social/Family/Support Systems Patient Roles: Other (Comment) (Has mom and sisters.) Contact Information: Levell July - mom Anticipated Caregiver: mom Anticipated Caregiver's Contact Information: Luma Weesner - [phone]/Limitations of Caregiver: Mom is not working and can provide supervision to light assist. Caregiver Availability: 24/7 Discharge Plan Discussed with Primary Caregiver: Yes Is Caregiver In Agreement with Plan?: Yes Does Caregiver/Family have Issues with Lodging/Transportation while Pt is in Rehab?: No  Goals Patient/Family Goal for Rehab: PT/OT supervision  to min Assist goals Expected length of stay: 7-10 days Pt/Family Agrees to Admission and willing to participate: Yes Program Orientation Provided & Reviewed with Pt/Caregiver Including Roles  & Responsibilities: Yes  Decrease burden of Care through IP rehab admission: N/A  Possible need for SNF placement upon discharge: Not planned  Patient Condition: I have reviewed medical records from [facility], spoken with CM, and patient and family member. I met with patient at the bedside for inpatient rehabilitation assessment.  Patient will benefit from ongoing PT and OT, can actively participate in 3 hours of therapy a day 5 days of the week, and can make measurable gains during the admission.  Patient will also benefit from the coordinated team approach during an Inpatient Acute Rehabilitation admission.  The patient will receive intensive therapy as well as Rehabilitation physician, nursing, social worker, and care management interventions.  Due to bladder management, bowel management, safety, skin/wound care, disease management, medication administration, pain management, and patient education the patient requires 24 hour a day rehabilitation nursing.  The patient is currently min A with mobility and basic ADLs.  Discharge setting and therapy post discharge at home with home health is anticipated.  Patient has agreed to participate in the Acute Inpatient Rehabilitation Program and will admit today.  Preadmission Screen Completed By:  Genella Mech, 04/05/2021 12:22 PM with day of updates by Clemens Catholic ______________________________________________________________________   Discussed status with Dr. Dagoberto Ligas on 04/05/21 at 44 and received approval for admission today.  Admission Coordinator:  Clemens Catholic, [place], [place] , time 1220 /Date 04/05/21   Assessment/Plan: Diagnosis: Does the need for close, 24 hr/day Medical supervision in concert with the patient's rehab needs make it unreasonable for this patient to be served in a less intensive setting? Yes Co-Morbidities requiring supervision/potential complications: BMI 67; NWB LLE- multiligament- knee injury; UTI- on keflex; HTN; asthma Due to bladder management, bowel management, safety, skin/wound care, disease management, medication administration, pain management, and patient education, does the patient require 24 hr/day rehab nursing?  Yes Does the patient require coordinated care of a physician, rehab nurse, PT, OT, and SLP to address physical and functional deficits in the context of the above medical diagnosis(es)? Yes Addressing deficits in the following areas: balance, endurance, locomotion, strength, transferring, bowel/bladder control, bathing, dressing, feeding, grooming, and toileting Can the patient actively participate in an intensive therapy program of at least 3 hrs of therapy 5 days a week? Yes The potential for patient to make measurable gains while on inpatient rehab is good Anticipated functional outcomes upon discharge from inpatient rehab: supervision and min assist PT, supervision and min assist OT, n/a SLP Estimated rehab length of stay to reach the above functional goals is: 7-10 days Anticipated discharge destination: Home 10. Overall Rehab/Functional Prognosis: good   MD Signature:

## 2021-03-31 ENCOUNTER — Encounter (HOSPITAL_COMMUNITY): Payer: Self-pay | Admitting: Orthopedic Surgery

## 2021-03-31 ENCOUNTER — Encounter: Payer: Medicaid Other | Admitting: Orthopedic Surgery

## 2021-03-31 NOTE — Plan of Care (Signed)

## 2021-03-31 NOTE — Plan of Care (Signed)

## 2021-03-31 NOTE — Progress Notes (Signed)
Mobility Specialist Criteria Algorithm Info.   03/31/21 1130  Mobility  Activity Transferred:  Bed to chair;Stood at bedside  Range of Motion/Exercises Passive;Left leg  Level of Assistance Minimal assist, patient does 75% or more  Assistive Device Front wheel walker  LLE Weight Bearing NWB  Distance Ambulated (ft) 2 ft  Mobility Out of bed to chair with meals  Mobility Response Tolerated well  Mobility performed by Mobility specialist  Bed Position Chair   Patient received in bed very eager and motivated to participate in mobility. Required min A to EOB, more so assisting LLE. Stood from normal bed level height and pivoted to recliner chair with supervision. Was able to take hops backwards to chair as well. Tolerated OOB mobility well without incident or complaints of significant pain. Was left with all needs met and call bell in reach.   03/31/2021 12:22 PM

## 2021-03-31 NOTE — Progress Notes (Signed)
Chaplain responded to page from pt's nurse requesting support for pt whose aunt had died today.  Chaplain offered ministry of presence as pt explained how hard life has been recently and how distressed she is over her aunt's death.  Pt explained she can't be with her mother who is grieving, she can't go to the funeral bc she's in the hospital. Pt expressed concern over the hardship of getting back on her feet physically, monetarily, spiritually as a result of the fall.  Chaplain prayed with patient.  Vernell Morgans Chaplain

## 2021-03-31 NOTE — Progress Notes (Signed)
IP rehab admissions - Long discussion with patient today about workers comp versus medicaid versus self pay status.  We called medicaid and patient has medicaid for family planning only at this time.  She plans to call her lawyer about workers comp.  She does not have a claim number or a workers comp case yet, so I cannot seek authorization for rehab.  I will follow up again tomorrow.  Call for questions.  [phone]

## 2021-03-31 NOTE — Progress Notes (Signed)
Physical Therapy Treatment Patient Details Name: Meagan Oneill MRN: [id number] DOB: Aug 24, 1990 Today's Date: 03/31/2021   History of Present Illness Pt. is 30 yr old F admitted on 12/16 admitted for planned surgery to correct multiligament knee injury that occured 6 weeks ago.  Sx included L ACL, PCL, popliteal fibular ligament, lateral collateral ligament, and laternal meniscus root repair. PMH: anemia, asthma, HTN    PT Comments    Pt admitted with above diagnosis. Pt continues to gain confidence and progress daily. Was able to hop on right LE a short distance x 2 today. Discussed with pt that she needs to gain strength and practice on Rehab with bed mob, transfers, short distance ambulation and wheelchair mobility piror to d/c home and pt agrees. Will continue to prgoress pt as able.  Pt currently with functional limitations due to the deficits listed below (see PT Problem List). Pt will benefit from skilled PT to increase their independence and safety with mobility to allow discharge to the venue listed below.      Recommendations for follow up therapy are one component of a multi-disciplinary discharge planning process, led by the attending physician.  Recommendations may be updated based on patient status, additional functional criteria and insurance authorization.  Follow Up Recommendations  Acute inpatient rehab (3hours/day)     Assistance Recommended at Discharge Frequent or constant Supervision/Assistance  Equipment Recommendations  Rolling walker (2 wheels);BSC/3in1;Wheelchair (measurements PT)    Recommendations for Other Services OT consult     Precautions / Restrictions Precautions Precautions: Fall Required Braces or Orthoses: Knee Immobilizer - Left Knee Immobilizer - Left: On at all times Restrictions LLE Weight Bearing: Non weight bearing     Mobility  Bed Mobility               General bed mobility comments: Pt in chair    Transfers Overall transfer  level: Needs assistance Equipment used: Rolling walker (2 wheels) Transfers: Sit to/from Stand;Bed to chair/wheelchair/BSC Sit to Stand: Min assist;Min guard           General transfer comment: Pt stood to RW wtih min guard to min assist x 2. No cues for hand or foot placement and able to maintain NWB left LE>    Ambulation/Gait Ambulation/Gait assistance: Min guard;Min assist Gait Distance (Feet): 12 Feet (6 feet x 2) Assistive device: Rolling walker (2 wheels) Gait Pattern/deviations: Step-to pattern   Gait velocity interpretation: <1.31 ft/sec, indicative of household ambulator   General Gait Details: Pt able to hop 3 feet, turn and then back to chair x 2 attempts. Pt fatigues quickly but is able to maintain NWB left LE to hop a few feet forward and back.  Heavy use of RW for support.  Occasional assist to move RW as well.  Pt also needed cues to get squared up to chair.   Stairs             Wheelchair Mobility    Modified Rankin (Stroke Patients Only)       Balance Overall balance assessment: Needs assistance Sitting-balance support: Bilateral upper extremity supported;Feet supported Sitting balance-Leahy Scale: Fair     Standing balance support: Bilateral upper extremity supported;During functional activity Standing balance-Leahy Scale: Poor Standing balance comment: relies on UE support for balance                            Cognition Arousal/Alertness: Awake/alert Behavior During Therapy: WFL for tasks assessed/performed Overall Cognitive Status:  Within Functional Limits for tasks assessed                                          Exercises Total Joint Exercises Ankle Circles/Pumps: AROM;Both;20 reps Hip ABduction/ADduction: AAROM;Left;20 reps Long Arc Quad: AAROM;Left;20 reps General Exercises - Lower Extremity Gluteal Sets: AROM;Both;10 reps;Supine Heel Slides: AROM;Right;10 reps;Supine    General Comments General  comments (skin integrity, edema, etc.): VSS      Pertinent Vitals/Pain Pain Assessment: Faces Faces Pain Scale: Hurts even more Pain Location: L knee Pain Descriptors / Indicators: Aching;Burning;Discomfort Pain Intervention(s): Limited activity within patient's tolerance;Monitored during session;Repositioned    Home Living                          Prior Function            PT Goals (current goals can now be found in the care plan section) Acute Rehab PT Goals Patient Stated Goal: home Progress towards PT goals: Progressing toward goals    Frequency    Min 5X/week      PT Plan Current plan remains appropriate    Co-evaluation              AM-PAC PT "6 Clicks" Mobility   Outcome Measure  Help needed turning from your back to your side while in a flat bed without using bedrails?: A Little Help needed moving from lying on your back to sitting on the side of a flat bed without using bedrails?: A Little Help needed moving to and from a bed to a chair (including a wheelchair)?: A Lot Help needed standing up from a chair using your arms (e.g., wheelchair or bedside chair)?: A Lot Help needed to walk in hospital room?: Total Help needed climbing 3-5 steps with a railing? : Total 6 Click Score: 12    End of Session Equipment Utilized During Treatment: Gait belt Activity Tolerance: Patient tolerated treatment well;Patient limited by pain Patient left: with call bell/phone within reach;in chair;with chair alarm set Nurse Communication: Mobility status PT Visit Diagnosis: History of falling (Z91.81);Pain;Other abnormalities of gait and mobility (R26.89) Pain - Right/Left: Left Pain - part of body: Knee     Time: 1219-1249 PT Time Calculation (min) (ACUTE ONLY): 30 min  Charges:  $Gait Training: 8-22 mins $Therapeutic Exercise: 8-22 mins                     Gianny Sabino M,PT Acute Rehab Services [phone] (pager)    Alvira Philips 03/31/2021, 2:26 PM

## 2021-03-31 NOTE — Progress Notes (Signed)
°  Subjective: Pt stable - pain ok   Objective: Vital signs in last 24 hours: Temp:  [98.3 F (36.8 C)-98.5 F (36.9 C)] 98.5 F (36.9 C) (12/22 0751) Pulse Rate:  [78-85] 85 (12/22 0751) Resp:  [16-17] 17 (12/22 0751) BP: (120-134)/(75) 120/75 (12/22 0751) SpO2:  [96 %-98 %] 98 % (12/22 0751)  Intake/Output from previous day: 12/21 0701 - 12/22 0700 In: 1080 [P.O.:1080] Out: 2400 [Urine:2400] Intake/Output this shift: No intake/output data recorded.  Exam:  Intact pulses distally Dorsiflexion/Plantar flexion intact  Labs: Recent Labs    03/29/21 2138  HGB 9.8*   Recent Labs    03/29/21 2138  WBC 10.4  RBC 4.84  HCT 32.6*  PLT 241   Recent Labs    03/29/21 2138  NA 135  K 3.9  CL 101  CO2 27  BUN 9  CREATININE 0.61  GLUCOSE 97  CALCIUM 8.7*   No results for input(s): LABPT, INR in the last 72 hours.  Assessment/Plan: Dressings dced incisions ok aquacell applied laterally Ready for dc to snf - PT going ok - nwb with left leg in knee immobilizer - will start cpm next week - nwb for 4 weeks at least   [employer] 03/31/2021, 8:26 PM

## 2021-04-01 ENCOUNTER — Encounter (HOSPITAL_COMMUNITY): Payer: Self-pay | Admitting: Orthopedic Surgery

## 2021-04-01 NOTE — Progress Notes (Signed)
Occupational Therapy Treatment Patient Details Name: Meagan Oneill MRN: [id number] DOB: [birth year] Today's Date: 04/01/2021   History of present illness Pt. is 30 yr old F admitted on 12/16 admitted for planned surgery to correct multiligament knee injury that occured 6 weeks ago.  Sx included L ACL, PCL, popliteal fibular ligament, lateral collateral ligament, and laternal meniscus root repair. PMH: anemia, asthma, HTN   OT comments  Pt making good progress with functional goals. Session focused on functional ,mobility sing RW to walk to bathroom for toileting. Pt abl to maintain L LE NWB. Awaiting bari BSC. OT will continue to follow acutely to maximize level of function and safety   Recommendations for follow up therapy are one component of a multi-disciplinary discharge planning process, led by the attending physician.  Recommendations may be updated based on patient status, additional functional criteria and insurance authorization.    Follow Up Recommendations  Acute inpatient rehab (3hours/day)    Assistance Recommended at Discharge Frequent or constant Supervision/Assistance  Equipment Recommendations  BSC/3in1;Tub/shower bench;Wheelchair (measurements OT);Wheelchair cushion (measurements OT)    Recommendations for Other Services      Precautions / Restrictions Precautions Precautions: Fall Required Braces or Orthoses: Knee Immobilizer - Left Knee Immobilizer - Left: On at all times Restrictions Weight Bearing Restrictions: Yes LLE Weight Bearing: Non weight bearing       Mobility Bed Mobility               General bed mobility comments: Pt in recliner upon arrival    Transfers Overall transfer level: Needs assistance Equipment used: Rolling walker (2 wheels) Transfers: Sit to/from Stand;Bed to chair/wheelchair/BSC Sit to Stand: Min assist;Min guard           General transfer comment: Pt stood to RW wtih min A from recliner, min guard A from toilet. Pt able  to maintain L LE NWB     Balance Overall balance assessment: Needs assistance Sitting-balance support: Bilateral upper extremity supported;Feet supported Sitting balance-Leahy Scale: Fair     Standing balance support: Bilateral upper extremity supported;During functional activity Standing balance-Leahy Scale: Poor                             ADL either performed or assessed with clinical judgement   ADL Overall ADL's : Needs assistance/impaired     Grooming: Wash/dry face;Wash/dry hands;Standing;Min guard   Upper Body Bathing: Set up;Supervision/ safety;Sitting Upper Body Bathing Details (indicate cue type and reason): simulated     Upper Body Dressing : Set up;Sitting       Toilet Transfer: Minimal assistance;Min guard;Ambulation;Rolling walker (2 wheels);Regular Toilet;Grab bars Toilet Transfer Details (indicate cue type and reason): awaiting bari [facility] Toileting- Clothing Manipulation and Hygiene: Moderate assistance;Sit to/from stand       Functional mobility during ADLs: Minimal assistance;Min guard;Rolling walker (2 wheels)      Extremity/Trunk Assessment Upper Extremity Assessment Upper Extremity Assessment: Overall WFL for tasks assessed   Lower Extremity Assessment Lower Extremity Assessment: Defer to PT evaluation        Vision Baseline Vision/History: 0 No visual deficits Ability to See in Adequate Light: 0 Adequate Patient Visual Report: No change from baseline     Perception     Praxis      Cognition Arousal/Alertness: Awake/alert Behavior During Therapy: WFL for tasks assessed/performed Overall Cognitive Status: Within Functional Limits for tasks assessed  Exercises     Shoulder Instructions       General Comments      Pertinent Vitals/ Pain       Pain Assessment: Faces Faces Pain Scale: Hurts little more Breathing: normal Negative Vocalization: none Facial  Expression: smiling or inexpressive Body Language: relaxed Pain Location: L knee Pain Descriptors / Indicators: Aching;Burning;Discomfort Pain Intervention(s): Monitored during session;Repositioned;Premedicated before session  Home Living                                          Prior Functioning/Environment              Frequency  Min 2X/week        Progress Toward Goals  OT Goals(current goals can now be found in the care plan section)  Progress towards OT goals: Progressing toward goals     Plan Discharge plan remains appropriate;Frequency remains appropriate    Co-evaluation                 AM-PAC OT "6 Clicks" Daily Activity     Outcome Measure   Help from another person eating meals?: None Help from another person taking care of personal grooming?: A Little Help from another person toileting, which includes using toliet, bedpan, or urinal?: A Lot Help from another person bathing (including washing, rinsing, drying)?: A Lot Help from another person to put on and taking off regular upper body clothing?: A Little Help from another person to put on and taking off regular lower body clothing?: A Lot 6 Click Score: 16    End of Session Equipment Utilized During Treatment: Left knee immobilizer;Gait belt;Rolling walker (2 wheels)  OT Visit Diagnosis: Other abnormalities of gait and mobility (R26.89);Pain Pain - Right/Left: Left Pain - part of body: Knee   Activity Tolerance Patient tolerated treatment well   Patient Left in chair;with call bell/phone within reach   Nurse Communication          Time: 1141-1200 OT Time Calculation (min): 19 min  Charges: OT General Charges $OT Visit: 1 Visit OT Treatments $Self Care/Home Management : 8-22 mins    Galen Manila 04/01/2021, 1:13 PM

## 2021-04-01 NOTE — Progress Notes (Signed)
Mobility Specialist Criteria Algorithm Info.   04/01/21 1037  Mobility  Activity Ambulated in room;Ambulated to bathroom;Transferred:  Bed to chair (to chair after ambulation)  Range of Motion/Exercises Active;All extremities  Level of Assistance Contact guard assist, steadying assist  Assistive Device Front wheel walker  LLE Weight Bearing NWB  Distance Ambulated (ft) 25 ft  Mobility Ambulated with assistance in room;Out of bed for toileting;Sit up in bed/chair position for meals  Mobility Response Tolerated well  Mobility performed by Mobility specialist  Bed Position Chair   Patient received in bed very eager to participate in mobility. Patient requested to use [facility] but determined that [facility] in room was not the appropriate width for patient. Agreed to attempt ambulation to bathroom with max encouragement. Required min A to EOB, more so assisting LLE and stood min guard with cues for hand/foot placement. Was able to hop to bathroom min guard needing min A to descend from standing>sitting. Hopped to recliner after using restroom. Tolerated ambulating to bathroom better and is gaining more confidence. Was left in recliner chair with all needs met and call bell in reach.   04/01/2021 11:53 AM

## 2021-04-01 NOTE — Progress Notes (Signed)
Patient stable. Sitting in chair. Dressings intact.  Change last night. Medically ready for discharge to skilled nursing.  Is improving her mobilization some. On Xarelto for DVT prophylaxis

## 2021-04-01 NOTE — Progress Notes (Signed)
Physical Therapy Treatment Patient Details Name: Meagan Oneill MRN: [id number] DOB: 24-Feb-1991 Today's Date: 04/01/2021   History of Present Illness Pt. is 30 yr old F admitted on 12/16 admitted for planned surgery to correct multiligament knee injury that occured 6 weeks ago.  Sx included L ACL, PCL, popliteal fibular ligament, lateral collateral ligament, and laternal meniscus root repair. PMH: anemia, asthma, HTN    PT Comments    Pt seated in recliner in tears from pain.  She was requesting assistance back to bed.  Pt refusing pain meds but requested ice.  Placed ice packs on lower L leg and positioned for comfort.     Recommendations for follow up therapy are one component of a multi-disciplinary discharge planning process, led by the attending physician.  Recommendations may be updated based on patient status, additional functional criteria and insurance authorization.  Follow Up Recommendations  Acute inpatient rehab (3hours/day)     Assistance Recommended at Discharge Frequent or constant Supervision/Assistance  Equipment Recommendations  Rolling walker (2 wheels);BSC/3in1;Wheelchair (measurements PT)    Recommendations for Other Services OT consult     Precautions / Restrictions Precautions Precautions: Fall Required Braces or Orthoses: Knee Immobilizer - Left Knee Immobilizer - Left: On at all times Restrictions Weight Bearing Restrictions: Yes LLE Weight Bearing: Non weight bearing     Mobility  Bed Mobility Overal bed mobility: Needs Assistance Bed Mobility: Sit to Supine     Supine to sit: Min assist     General bed mobility comments: Min assistance to move back to bed lifting LLE.    Transfers Overall transfer level: Needs assistance Equipment used: Rolling walker (2 wheels) Transfers: Sit to/from Stand Sit to Stand: Min assist Stand pivot transfers: Min assist         General transfer comment: Min assistance to LLE during rise into standing and  pivot from chair back to bed.  PTA assisted with RW management for turning and backing.  Performed hop steps back to bed, to painful for gt training this pm.    Ambulation/Gait                   Stairs             Wheelchair Mobility    Modified Rankin (Stroke Patients Only)       Balance Overall balance assessment: Needs assistance Sitting-balance support: Bilateral upper extremity supported;Feet supported Sitting balance-Leahy Scale: Fair     Standing balance support: Bilateral upper extremity supported;During functional activity Standing balance-Leahy Scale: Poor Standing balance comment: relies on UE support for balance                            Cognition Arousal/Alertness: Awake/alert Behavior During Therapy: WFL for tasks assessed/performed Overall Cognitive Status: Within Functional Limits for tasks assessed                                 General Comments: Pt tearful in chair on arrival requesting back to bed due to pain.        Exercises      General Comments        Pertinent Vitals/Pain Pain Assessment: Faces Faces Pain Scale: Hurts little more Breathing: normal Negative Vocalization: none Facial Expression: smiling or inexpressive Body Language: relaxed Pain Location: L knee Pain Descriptors / Indicators: Aching;Burning;Discomfort Pain Intervention(s): Monitored during session;Repositioned;Premedicated before session  Home Living                          Prior Function            PT Goals (current goals can now be found in the care plan section) Acute Rehab PT Goals Patient Stated Goal: home Potential to Achieve Goals: Good Progress towards PT goals: Progressing toward goals    Frequency    Min 5X/week      PT Plan Current plan remains appropriate    Co-evaluation              AM-PAC PT "6 Clicks" Mobility   Outcome Measure  Help needed turning from your back to your  side while in a flat bed without using bedrails?: A Little Help needed moving from lying on your back to sitting on the side of a flat bed without using bedrails?: A Little Help needed moving to and from a bed to a chair (including a wheelchair)?: A Lot Help needed standing up from a chair using your arms (e.g., wheelchair or bedside chair)?: A Lot Help needed to walk in hospital room?: Total Help needed climbing 3-5 steps with a railing? : Total 6 Click Score: 12    End of Session Equipment Utilized During Treatment: Gait belt Activity Tolerance: Patient tolerated treatment well;Patient limited by pain Patient left: with call bell/phone within reach;in chair;with chair alarm set Nurse Communication: Mobility status PT Visit Diagnosis: History of falling (Z91.81);Pain;Other abnormalities of gait and mobility (R26.89) Pain - Right/Left: Left Pain - part of body: Knee     Time: [id number] PT Time Calculation (min) (ACUTE ONLY): 13 min  Charges:  $Therapeutic Activity: 8-22 mins                     Erasmo Leventhal , PTA Acute Rehabilitation Services Pager [phone] Office [phone]    Jalayiah Bibian Eli Hose 04/01/2021, 1:22 PM

## 2021-04-02 NOTE — Progress Notes (Signed)
° ° ° °  Subjective: 8 Days Post-Op Procedure(s) (LRB): left knee anterior cruciate ligament, posterior cruciate ligment, posterolateral corner reconstruction arthroscopy; allograft ligaments for reconstruction, meniscal debridement vs repair (Left) Awake, alert and oriented x 4. She is depressed, her aunt's funeral is today and she will not be able to attend. I spoke with her and offered my condolences. She is in PT and is awaiting SNF placement.   Patient reports pain as moderate.    Objective:   VITALS:  Temp:  [97.7 F (36.5 C)-98 F (36.7 C)] 98 F (36.7 C) (12/24 0803) Pulse Rate:  [90-98] 98 (12/24 0803) Resp:  [17-18] 18 (12/23 2128) BP: (129-142)/(77-85) 129/80 (12/24 0803) SpO2:  [98 %-99 %] 99 % (12/24 0803)  Neurologically intact ABD soft Neurovascular intact Sensation intact distally Intact pulses distally Dorsiflexion/Plantar flexion intact Incision: dressing C/D/I and no drainage   LABS No results for input(s): HGB, WBC, PLT in the last 72 hours. No results for input(s): NA, K, CL, CO2, BUN, CREATININE, GLUCOSE in the last 72 hours. No results for input(s): LABPT, INR in the last 72 hours.   Assessment/Plan: 8 Days Post-Op Procedure(s) (LRB): left knee anterior cruciate ligament, posterior cruciate ligment, posterolateral corner reconstruction arthroscopy; allograft ligaments for reconstruction, meniscal debridement vs repair (Left)  Advance diet Up with therapy D/C IV fluids Discharge to SNF when placement is completed.   Vira Browns 04/02/2021, 11:15 AM Patient ID: Meagan Oneill, female   DOB: 03/27/1991, 30 y.o.   MRN: [id number]

## 2021-04-02 NOTE — Progress Notes (Signed)
Mobility Specialist Criteria Algorithm Info.   04/02/21 1230  Mobility  Activity Ambulated in room;Transferred:  Bed to chair;Transferred:  Chair to bed  Range of Motion/Exercises Passive;Left leg  Level of Assistance Contact guard assist, steadying assist  Assistive Device Front wheel walker  LLE Weight Bearing NWB  Distance Ambulated (ft) 35 ft  Mobility Ambulated with assistance in room  Mobility Response Tolerated well  Mobility performed by Mobility specialist  Bed Position Chair  Transport [place] Wheelchair   Patient received in bed slightly agitated but agreeable to participate. Still requires Min A to assist LE to EOB. Was min guard + cues to stand and hop to WC. Pt wheeled around the unit for approximately 10 minutes before returning to room. Requested assistance to restroom then to bed. Was able to stand from [facility] with minimal HHA and hop to and from restroom. Required assistance to get back into bed. Was left in bed with all needs met and call bell in reach.   04/02/2021 2:21 PM

## 2021-04-03 LAB — URINALYSIS, ROUTINE W REFLEX MICROSCOPIC
Bilirubin Urine: NEGATIVE
Glucose, UA: NEGATIVE mg/dL
Ketones, ur: NEGATIVE mg/dL
Leukocytes,Ua: NEGATIVE
Nitrite: NEGATIVE
Protein, ur: NEGATIVE mg/dL
Specific Gravity, Urine: >1.03 — ABNORMAL HIGH (ref 1.005–1.030)
pH: 6 (ref 5.0–8.0)

## 2021-04-03 LAB — URINALYSIS, MICROSCOPIC (REFLEX)

## 2021-04-03 NOTE — Progress Notes (Signed)
PT stated there is pain when urinating. Urine appearance is slightly viscous and has a orange/ cider color to it. UA and Urine culture ordered per PA requests - [facility]
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
## 2021-04-03 NOTE — Progress Notes (Signed)
° ° ° °  Subjective: 9 Days Post-Op Procedure(s) (LRB): left knee anterior cruciate ligament, posterior cruciate ligment, posterolateral corner reconstruction arthroscopy; allograft ligaments for reconstruction, meniscal debridement vs repair (Left) Awake, alert and oriented x 4, complains of urine sediment, pain with urination with burning. Previous history of UTI remotely. Had foley day of surgery. UA and urine C&S sent.   Patient reports pain as moderate.    Objective:   VITALS:  Temp:  [98 F (36.7 C)] 98 F (36.7 C) (12/25 0831) Pulse Rate:  [73-97] 87 (12/25 0831) Resp:  [16-18] 18 (12/25 0831) BP: (108-135)/(75-83) 108/75 (12/25 0831) SpO2:  [100 %] 100 % (12/25 0831)  Neurologically intact ABD soft Neurovascular intact Sensation intact distally Intact pulses distally Dorsiflexion/Plantar flexion intact Incision: dressing C/D/I and no drainage No cellulitis present Compartment soft Urine in canister from perwick is grossly purulent.   LABS No results for input(s): HGB, WBC, PLT in the last 72 hours. No results for input(s): NA, K, CL, CO2, BUN, CREATININE, GLUCOSE in the last 72 hours. No results for input(s): LABPT, INR in the last 72 hours.   Assessment/Plan: 9 Days Post-Op Procedure(s) (LRB): left knee anterior cruciate ligament, posterior cruciate ligment, posterolateral corner reconstruction arthroscopy; allograft ligaments for reconstruction, meniscal debridement vs repair (Left) Likely UTI  Advance diet Up with therapy D/C IV fluids Awake UA but I suspect UTI base on the amount of sediment in Perwick cannister.   Vira Browns 04/03/2021, 10:57 AM Patient ID: Meagan Oneill, female   DOB: 04-Sep-1990, 30 y.o.   MRN: [id number]

## 2021-04-03 NOTE — Progress Notes (Addendum)
Mobility Specialist Criteria Algorithm Info.    04/03/21 1235  Mobility  Activity Transferred:  Bed to chair  Range of Motion/Exercises Passive;Left leg  Level of Assistance Contact guard assist, steadying assist  Assistive Device Front wheel walker;Wheelchair  LLE Weight Bearing NWB  Distance Ambulated (ft) 10 ft  Mobility Ambulated with assistance in room  Mobility Response Tolerated well  Mobility performed by Mobility specialist  Bed Position Chair   Patient received in bed eager to participate in mobility. Required min A to EOB and was min guard to stand and transfer to WC. Pt rolled around the unit then returned to room. Declined further mobility but agreed to sit in [facility] for awhile. Was left in [facility] with all needs met and call bell in reach.   04/03/2021 12:37 PM

## 2021-04-04 DIAGNOSIS — S83512A Sprain of anterior cruciate ligament of left knee, initial encounter: Secondary | ICD-10-CM

## 2021-04-04 DIAGNOSIS — G5702 Lesion of sciatic nerve, left lower limb: Secondary | ICD-10-CM

## 2021-04-04 DIAGNOSIS — S83282A Other tear of lateral meniscus, current injury, left knee, initial encounter: Secondary | ICD-10-CM

## 2021-04-04 DIAGNOSIS — S83522A Sprain of posterior cruciate ligament of left knee, initial encounter: Secondary | ICD-10-CM

## 2021-04-04 DIAGNOSIS — S8992XD Unspecified injury of left lower leg, subsequent encounter: Secondary | ICD-10-CM

## 2021-04-04 MED ORDER — CEPHALEXIN 500 MG PO CAPS
500.0000 mg | ORAL_CAPSULE | Freq: Four times a day (QID) | ORAL | Status: DC
  Administered 2021-04-04 – 2021-04-05 (×7): 500 mg via ORAL
  Filled 2021-04-04 (×7): qty 1

## 2021-04-04 NOTE — Progress Notes (Signed)
Mobility Specialist Criteria Algorithm Info.    04/04/21 1050  Mobility  Activity Ambulated in room;Ambulated to bathroom;Transferred:  Bed to chair  Range of Motion/Exercises Passive;Left leg  Level of Assistance Contact guard assist, steadying assist  Assistive Device Front wheel walker;Wheelchair  LLE Weight Bearing NWB  Distance Ambulated (ft) 40 ft  Mobility Ambulated with assistance in room;Out of bed for toileting  Mobility Response Tolerated well; LOB x1 (self corrected)  Mobility performed by Mobility specialist  Bed Position Chair   Patient received in bed eager to participate in mobility. Got to EOB with minimal HHA, assisting LE to EOB. Pt stood min guard and transferred to [facility]. Pt Rolled around unit for a while then returned to room and requested assistance to restroom. Stood from [facility] and "hop to" bathroom w/ contact min guard assist. Was left in restroom with all needs met. RN/NT notified.     04/04/2021 12:21 PM

## 2021-04-04 NOTE — Progress Notes (Signed)
Inpatient Rehab Admissions Coordinator:   I will not admit this Pt. To CIR today. Pt. Reports that her lawyer has not gotten back to her about her worker's comp case. I also called and left VM for her lawyer, as her case sounds like a genuine worker's comp case. Self-pay estimate was provided to Pt. In case she ultimately decides to come as an uninsured patient.   Megan Salon, MS, CCC-SLP Rehab Admissions Coordinator  [phone] (celll) [phone] (office)

## 2021-04-04 NOTE — Progress Notes (Signed)
Patient ID: Meagan Oneill, female   DOB: 03-Aug-1990, 30 y.o.   MRN: [id number] Dysuria UA with 11-20 WBC per hpf Appears contaminated with many epithelial cells None the less symptomatic  On Xarelto so will use Keflex, sulfa tends to increase anticoagant affect.

## 2021-04-04 NOTE — Progress Notes (Signed)
° °  Subjective:  Patient reports pain as mild and well controlled. Keflex started overnight for concern for UTI.  Objective:   VITALS:   Vitals:   04/03/21 1516 04/03/21 2159 04/03/21 2201 04/04/21 0845  BP: 116/77 (!) 142/85 (!) 142/85 121/76  Pulse: 84  (!) 104 95  Resp:    18  Temp: 98.5 F (36.9 C) 98.1 F (36.7 C) 98.1 F (36.7 C) 97.6 F (36.4 C)  TempSrc: Oral Oral Oral   SpO2: 99% 100% 100% 98%  Weight:      Height:        Neurologically intact Neurovascular intact Sensation intact distally Intact pulses distally Dorsiflexion/Plantar flexion intact  Dressing CDI   Lab Results  Component Value Date   WBC 10.4 03/29/2021   HGB 9.8 (L) 03/29/2021   HCT 32.6 (L) 03/29/2021   MCV 67.4 (L) 03/29/2021   PLT 241 03/29/2021     Assessment/Plan:  10 Days Post-Op s/p left multiligamentous knee reconstruction, doing well, continues to work with PT  - Patient to work with PT/OT to optimize mobilization safely - DVT ppx - SCDs, ambulation, Xarelto - NWB operative extremity - Keflex started for UTI - Pain control - multimodal pain management - Discharge planning pending CM, appreciate coordination for complex dispo  Esli Clements 04/04/2021, 8:45 AM

## 2021-04-04 NOTE — Progress Notes (Signed)
Physical Therapy Treatment Patient Details Name: Meagan Oneill MRN: [id number] DOB: [birth year] Today's Date: 04/04/2021   History of Present Illness Pt. is 30 yr old F admitted on 12/16 admitted for planned surgery to correct multiligament knee injury that occured 6 weeks ago.  Sx included L ACL, PCL, popliteal fibular ligament, lateral collateral ligament, and laternal meniscus root repair. PMH: anemia, asthma, HTN    PT Comments    Patient progressing well towards PT goals. Session focused on gait training, transfers, exercise and w/c mobility. Noted to have right knee hyperextension thrust at times during gait training and balance deficits requiring Min A for support. Worked on w/c negotiation for UE strengthening and cardiovascular exercise. Instructed pt in there ex for glutes. Encouraged w/c mobility twice daily. Will follow.    Recommendations for follow up therapy are one component of a multi-disciplinary discharge planning process, led by the attending physician.  Recommendations may be updated based on patient status, additional functional criteria and insurance authorization.  Follow Up Recommendations  Acute inpatient rehab (3hours/day)     Assistance Recommended at Discharge Frequent or constant Supervision/Assistance  Equipment Recommendations  Rolling walker (2 wheels);BSC/3in1;Wheelchair (measurements PT)    Recommendations for Other Services       Precautions / Restrictions Precautions Precautions: Fall Required Braces or Orthoses: Knee Immobilizer - Left Knee Immobilizer - Left: On at all times Restrictions Weight Bearing Restrictions: Yes LLE Weight Bearing: Non weight bearing     Mobility  Bed Mobility Overal bed mobility: Needs Assistance Bed Mobility: Supine to Sit;Sit to Supine     Supine to sit: Min assist;HOB elevated Sit to supine: Min guard   General bed mobility comments: Min A to move LLE to EOB, able to bring LLE into bed to return to supine.     Transfers Overall transfer level: Needs assistance Equipment used: Rolling walker (2 wheels) Transfers: Sit to/from Stand Sit to Stand: Min assist     Step pivot transfers: Min assist     General transfer comment: Min A to steady in standing, stood from EOB x2, from wheelchair x1, SPT bed to/from w/c x1. Pt hypermobile in right knee.    Ambulation/Gait Ambulation/Gait assistance: Min guard;Min assist Gait Distance (Feet): 20 Feet Assistive device: Rolling walker (2 wheels)   Gait velocity: decreased Gait velocity interpretation: <1.31 ft/sec, indicative of household ambulator   General Gait Details: Hop to gait with use of RW, compliant with NWb LLE. Fatigues quickly. 1 LOB when turning need Min A for support. Heavy reliance on UEs.   Stairs             Information systems manager mobility: Yes Wheelchair propulsion: Both upper extremities Wheelchair parts: Supervision/cueing Distance: 110 Wheelchair Assistance Details (indicate cue type and reason): Fatigues needing a few rest breaks.  Modified Rankin (Stroke Patients Only)       Balance Overall balance assessment: Needs assistance Sitting-balance support: Feet supported;No upper extremity supported Sitting balance-Leahy Scale: Fair     Standing balance support: During functional activity;Reliant on assistive device for balance Standing balance-Leahy Scale: Poor Standing balance comment: relies on UE support for balance                            Cognition Arousal/Alertness: Awake/alert Behavior During Therapy: WFL for tasks assessed/performed Overall Cognitive Status: Within Functional Limits for tasks assessed  Exercises General Exercises - Lower Extremity Hip ABduction/ADduction: AAROM;Left;10 reps;Supine (x2) Other Exercises Other Exercises: SL bridging RLE x10    General Comments         Pertinent Vitals/Pain Pain Assessment: Faces Faces Pain Scale: Hurts little more Pain Location: L knee Pain Descriptors / Indicators: Aching;Burning;Discomfort Pain Intervention(s): Monitored during session;Repositioned    Home Living                          Prior Function            PT Goals (current goals can now be found in the care plan section) Progress towards PT goals: Progressing toward goals    Frequency    Min 5X/week      PT Plan Current plan remains appropriate    Co-evaluation              AM-PAC PT "6 Clicks" Mobility   Outcome Measure  Help needed turning from your back to your side while in a flat bed without using bedrails?: A Little Help needed moving from lying on your back to sitting on the side of a flat bed without using bedrails?: A Little Help needed moving to and from a bed to a chair (including a wheelchair)?: A Little Help needed standing up from a chair using your arms (e.g., wheelchair or bedside chair)?: A Little Help needed to walk in hospital room?: A Lot Help needed climbing 3-5 steps with a railing? : Total 6 Click Score: 15    End of Session Equipment Utilized During Treatment: Gait belt Activity Tolerance: Patient tolerated treatment well Patient left: in bed;with call bell/phone within reach Nurse Communication: Mobility status PT Visit Diagnosis: History of falling (Z91.81);Pain;Other abnormalities of gait and mobility (R26.89) Pain - Right/Left: Left Pain - part of body: Knee     Time: 1455-1551 PT Time Calculation (min) (ACUTE ONLY): 56 min  Charges:  $Gait Training: 8-22 mins $Therapeutic Exercise: 8-22 mins $Therapeutic Activity: 8-22 mins                     Vale Haven, PT, DPT Acute Rehabilitation Services Pager [phone] Office [phone]      Blake Divine A Lanier Ensign 04/04/2021, 4:16 PM

## 2021-04-04 NOTE — H&P (Signed)
Physical Medicine and Rehabilitation Admission H&P   CC: L knee pain  HPI: Meagan Oneill is a 30 year old right-handed female with unremarkable past medical history except obesity with BMI 67.17.  Per chart review lives with parent who physically cannot provide assist.  1 level home.  Per chart review patient works as a Quarry manager.  Approximately 6 weeks ago patient sustained a low-energy knee dislocation while at work.  MRI showed injury to the ACL with PCL femoral avulsion of the lateral sided injury.  Presented 03/25/2021 with persistent increasing knee pain.  Patient underwent arthroscopic PCL reconstruction/arthroscopic ACL reconstruction using bone patellar tendon bone allograft/lateral posterior horn medial meniscal root repair.  Posterior lateral corner reconstruction with popliteal fibular ligament reconstruction and lateral collateral ligament reconstruction with peroneal nerve neurolysis 03/26/2021 per Dr. Marcene Duos.  Knee immobilizer at all times nonweightbearing left lower extremity.  Maintained on Xarelto for DVT prophylaxis.  Acute blood loss anemia 9.6 and monitored.  Hospital course dysuria urinalysis WBC 11-20,000 few bacteria placed on Keflex 04/04/2021 empirically.  Therapy evaluations completed due to patient decreased functional mobility was admitted for a comprehensive rehab program.  Pt reports pain is tolerable- max 6/10- taking tylenol- no opiates since 12/23 and no IV pain meds since 12/17.  No rubbing from brace.  Says had dysuria, but better since Keflex started.    Review of Systems  Constitutional:  Negative for chills and fever.  HENT:  Negative for hearing loss.   Eyes:  Negative for blurred vision and double vision.  Respiratory:  Negative for shortness of breath.   Cardiovascular:  Positive for leg swelling. Negative for chest pain and palpitations.  Gastrointestinal:  Positive for constipation. Negative for heartburn, nausea and vomiting.  Genitourinary:   Negative for dysuria, flank pain and hematuria.  Musculoskeletal:  Positive for joint pain and myalgias.  Skin:  Negative for rash.  All other systems reviewed and are negative. Past Medical History:  Diagnosis Date   Anemia    Asthma    Environmental allergies    Hypertension    Past Surgical History:  Procedure Laterality Date   ANTERIOR CRUCIATE LIGAMENT REPAIR Left 02/24/2021   KNEE ARTHROSCOPY WITH ANTERIOR CRUCIATE LIGAMENT (ACL) REPAIR WITH HAMSTRING GRAFT Left 03/25/2021   Procedure: left knee anterior cruciate ligament, posterior cruciate ligment, posterolateral corner reconstruction arthroscopy; allograft ligaments for reconstruction, meniscal debridement vs repair;  Surgeon: Meredith Pel, MD;  Location: [place];  Service: Orthopedics;  Laterality: Left;   Family History  Problem Relation Age of Onset   Hypertension Mother    Diabetes Mother    Hypertension Sister    Social History:  reports that she has never smoked. She has never used smokeless tobacco. She reports current alcohol use of about 1.0 standard drink per week. She reports that she does not use drugs. Allergies:  Allergies  Allergen Reactions   Morphine And Related Hives   Medications Prior to Admission  Medication Sig Dispense Refill   acetaminophen (TYLENOL) 500 MG tablet Take 1,000 mg by mouth every 6 (six) hours as needed for moderate pain.     ibuprofen (ADVIL) 600 MG tablet Take 1 tablet (600 mg total) by mouth every 6 (six) hours as needed. (Patient not taking: Reported on 03/16/2021) 30 tablet 0   metroNIDAZOLE (FLAGYL) 500 MG tablet Take 1 tablet (500 mg total) by mouth 2 (two) times daily. (Patient not taking: Reported on 03/16/2021) 14 tablet 0   oxyCODONE-acetaminophen (PERCOCET/ROXICET) 5-325 MG tablet Take 1  tablet by mouth every 6 (six) hours as needed for severe pain. 20 tablet 0    Drug Regimen Review Drug regimen was reviewed and remains appropriate with no significant issues  identified  Home: Home Living Family/patient expects to be discharged to:: Private residence Living Arrangements: Parent Available Help at Discharge: Family Type of Home: House Home Access: Level entry Home Layout: One level Bathroom Shower/Tub: Chiropodist: Standard Home Equipment: Conservation officer, nature (2 wheels), Industrial/product designer History: Prior Function Prior Level of Function : Needs assist Physical Assist : ADLs (physical) Mobility Comments: Pt. states she has been using RW since knee injury 6 weeks ago and staying at her mom's house. Prior to injury was indepedent ADLs Comments: Mom has been assisting with ADLs  Functional Status:  Mobility: Bed Mobility Overal bed mobility: Needs Assistance Bed Mobility: Supine to Sit, Sit to Supine Rolling: Max assist Supine to sit: Min assist, HOB elevated Sit to supine: Min guard General bed mobility comments: Min A to move LLE to EOB, able to bring LLE into bed to return to supine. Transfers Overall transfer level: Needs assistance Equipment used: Rolling walker (2 wheels) Transfers: Sit to/from Stand Sit to Stand: Min assist Bed to/from chair/wheelchair/BSC transfer type:: Step pivot Stand pivot transfers: Min assist Step pivot transfers: Min assist  Lateral/Scoot Transfers: Mod assist, +2 physical assistance, +2 safety/equipment General transfer comment: Min A to steady in standing, stood from EOB x2, from wheelchair x1, SPT bed to/from w/c x1. Pt hypermobile in right knee. Ambulation/Gait Ambulation/Gait assistance: Min guard, Min assist Gait Distance (Feet): 20 Feet Assistive device: Rolling walker (2 wheels) Gait Pattern/deviations: Step-to pattern General Gait Details: Hop to gait with use of RW, compliant with NWb LLE. Fatigues quickly. 1 LOB when turning need Min A for support. Heavy reliance on UEs. Gait velocity: decreased Gait velocity interpretation: <1.31 ft/sec, indicative of household  Engineer, mining mobility: Yes Wheelchair propulsion: Both upper extremities Wheelchair parts: Supervision/cueing Distance: 110 Wheelchair Assistance Details (indicate cue type and reason): Fatigues needing a few rest breaks.  ADL: ADL Overall ADL's : Needs assistance/impaired Eating/Feeding: Independent, Sitting Grooming: Wash/dry face, Wash/dry hands, Standing, Min guard Upper Body Bathing: Set up, Supervision/ safety, Sitting Upper Body Bathing Details (indicate cue type and reason): simulated Lower Body Bathing: Maximal assistance, Sitting/lateral leans Upper Body Dressing : Set up, Sitting Lower Body Dressing: Maximal assistance, Sitting/lateral leans Toilet Transfer: Minimal assistance, Min guard, Ambulation, Rolling walker (2 wheels), Regular Toilet, Grab bars Toilet Transfer Details (indicate cue type and reason): awaiting bari [facility] Toileting- Clothing Manipulation and Hygiene: Moderate assistance, Sit to/from stand Functional mobility during ADLs: Minimal assistance, Min guard, Rolling walker (2 wheels)  Cognition: Cognition Overall Cognitive Status: Within Functional Limits for tasks assessed Orientation Level: Oriented X4 Cognition Arousal/Alertness: Awake/alert Behavior During Therapy: WFL for tasks assessed/performed Overall Cognitive Status: Within Functional Limits for tasks assessed General Comments: Pt tearful in chair on arrival requesting back to bed due to pain.  Physical Exam: Blood pressure (!) 142/77, pulse 95, temperature 98.1 F (36.7 C), temperature source Oral, resp. rate 18, height 5\' 3"  (1.6 m), weight (!) 172 kg, last menstrual period 06/17/2020, SpO2 99 %. Physical Exam Vitals and nursing note reviewed. Exam conducted with a chaperone present.  Constitutional:      Appearance: She is obese.     Comments: Pt BMI 67; sitting up in bed- transferred to bedside chair with min A with OT- NT at bedside; -bright affect, NAD  HENT:      Head: Normocephalic and atraumatic.     Right Ear: External ear normal.     Left Ear: External ear normal.     Nose: Nose normal. No congestion.     Mouth/Throat:     Mouth: Mucous membranes are moist.     Pharynx: Oropharynx is clear. No oropharyngeal exudate.  Eyes:     General:        Right eye: No discharge.        Left eye: No discharge.     Extraocular Movements: Extraocular movements intact.  Cardiovascular:     Rate and Rhythm: Normal rate and regular rhythm.     Heart sounds: Normal heart sounds. No murmur heard.   No gallop.  Pulmonary:     Effort: Pulmonary effort is normal. No respiratory distress.     Breath sounds: Normal breath sounds. No stridor. No wheezing, rhonchi or rales.  Abdominal:     Comments: Protuberant; soft, NT, ND (+)BS- LBM today  Musculoskeletal:     Cervical back: Normal range of motion. No rigidity.     Comments: UE strength 5/5 in biceps, triceps, WE, grip and FA B/L  RLE- 5/5 in HF/KE/KF/DF and PF LLE- in KI- HF at least 3+/5; and DF/PF at least 3/5- painful   Skin:    General: Skin is warm and dry.     Comments: Surgical knee incision site with dressing in place clean dry and intact. Buttocks had a tiny pinpoint area that was open around rectum- otherwise, skin looked great- no rubbing from KI on LLE  Neurological:     General: No focal deficit present.     Mental Status: She is oriented to person, place, and time.     Comments: Patient is alert.  No acute distress.  Oriented x3 and follows commands. Intact to light touch in UE B/L and RLE- decreased slightly L knee and below  Psychiatric:        Mood and Affect: Mood normal.        Behavior: Behavior normal.    Results for orders placed or performed during the hospital encounter of 03/25/21 (from the past 48 hour(s))  Urinalysis, Routine w reflex microscopic Urine, Clean Catch     Status: Abnormal   Collection Time: 04/03/21 10:20 AM  Result Value Ref Range   Color, Urine YELLOW  YELLOW   APPearance HAZY (A) CLEAR   Specific Gravity, Urine >1.030 (H) 1.005 - 1.030   pH 6.0 5.0 - 8.0   Glucose, UA NEGATIVE NEGATIVE mg/dL   Hgb urine dipstick LARGE (A) NEGATIVE   Bilirubin Urine NEGATIVE NEGATIVE   Ketones, ur NEGATIVE NEGATIVE mg/dL   Protein, ur NEGATIVE NEGATIVE mg/dL   Nitrite NEGATIVE NEGATIVE   Leukocytes,Ua NEGATIVE NEGATIVE    Comment: Performed at [facility], [place] [address] [place], [place] [address]  Urinalysis, Microscopic (reflex)     Status: Abnormal   Collection Time: 04/03/21 10:20 AM  Result Value Ref Range   RBC / HPF 21-50 0 - 5 RBC/hpf   WBC, UA 11-20 0 - 5 WBC/hpf   Bacteria, UA FEW (A) NONE SEEN   Squamous Epithelial / LPF 21-50 0 - 5   Mucus PRESENT     Comment: Performed at [facility], [place] [address] [place], [place] [address]   No results found.     Medical Problem List and Plan: 1.  Debility functional deficits secondary to fall resulting in  left knee dislocation/posterior and anterior cruciate ligament tear/lateral meniscal root avulsion/posterior lateral corner injury involving the popliteal fibular ligament and lateral collateral ligament/peroneal nerve scarring.S/P arthroscopic PCL ACL reconstruction/lateral posterior horn medial meniscal root repair/posterior lateral corner reconstruction popliteal fibular ligament and lateral collateral ligament reconstruction with peroneal nerve neurolysis 03/25/2021.  Nonweightbearing with knee immobilizer at all times  -patient may  shower- cover knee on L and use KI  -ELOS/Goals: 7-10 days supervision to mod I- mother cannot help physically 2.  Antithrombotics: -DVT/anticoagulation:  Pharmaceutical: Xarelto check vascular  -antiplatelet therapy: N/A 3. Pain Management: Celebrex 200 mg twice daily, Robaxin as needed/oxycodone as needed- pt only using tylenol and robaxin currently.  4. Mood: Vita motional support  -antipsychotic agents: N/A 5. Neuropsych: This patient is  capable of making decisions on her own behalf. 6. Skin/Wound Care: Routine skin checks 7. Fluids/Electrolytes/Nutrition: Routine in and outs with follow-up chemistries 8.  Acute blood loss anemia.  Follow-up CBC 9.  Hypertension.  Lopressor 25 mg twice daily.  Monitor with increased mobility 10.  Suspected UTI.  Empiric Keflex with urine culture pending- dysuria better with keflex on board.  11.  Morbid obesity.  BMI 67.17.  Dietary follow-up  I have personally performed a face to face diagnostic evaluation of this patient and formulated the key components of the plan.  Additionally, I have personally reviewed laboratory data, imaging studies, as well as relevant notes and concur with the physician assistant's documentation above.   The patient's status has not changed from the original H&P.  Any changes in documentation from the acute care chart have been noted above.      Mcarthur Rossetti Angiulli, PA-C 04/05/2021

## 2021-04-05 ENCOUNTER — Inpatient Hospital Stay (HOSPITAL_COMMUNITY)
Admission: RE | Admit: 2021-04-05 | Discharge: 2021-04-11 | DRG: 945 | Disposition: A | Discharge status: Home / Self Care | Payer: Self-pay | Source: Intra-hospital | Attending: Physical Medicine and Rehabilitation | Admitting: Physical Medicine and Rehabilitation

## 2021-04-05 ENCOUNTER — Encounter (HOSPITAL_COMMUNITY): Payer: Self-pay | Admitting: Physical Medicine & Rehabilitation

## 2021-04-05 ENCOUNTER — Other Ambulatory Visit: Payer: Self-pay

## 2021-04-05 DIAGNOSIS — G5792 Unspecified mononeuropathy of left lower limb: Secondary | ICD-10-CM | POA: Diagnosis present

## 2021-04-05 DIAGNOSIS — F419 Anxiety disorder, unspecified: Secondary | ICD-10-CM | POA: Diagnosis present

## 2021-04-05 DIAGNOSIS — B49 Unspecified mycosis: Secondary | ICD-10-CM | POA: Diagnosis present

## 2021-04-05 DIAGNOSIS — R5381 Other malaise: Principal | ICD-10-CM | POA: Diagnosis present

## 2021-04-05 DIAGNOSIS — S83282D Other tear of lateral meniscus, current injury, left knee, subsequent encounter: Secondary | ICD-10-CM | POA: Diagnosis not present

## 2021-04-05 DIAGNOSIS — Z8249 Family history of ischemic heart disease and other diseases of the circulatory system: Secondary | ICD-10-CM

## 2021-04-05 DIAGNOSIS — S83282A Other tear of lateral meniscus, current injury, left knee, initial encounter: Secondary | ICD-10-CM | POA: Diagnosis present

## 2021-04-05 DIAGNOSIS — S83105D Unspecified dislocation of left knee, subsequent encounter: Secondary | ICD-10-CM

## 2021-04-05 DIAGNOSIS — Z6841 Body Mass Index (BMI) 40.0 and over, adult: Secondary | ICD-10-CM

## 2021-04-05 DIAGNOSIS — W19XXXD Unspecified fall, subsequent encounter: Secondary | ICD-10-CM | POA: Diagnosis present

## 2021-04-05 DIAGNOSIS — S83105S Unspecified dislocation of left knee, sequela: Secondary | ICD-10-CM

## 2021-04-05 DIAGNOSIS — I10 Essential (primary) hypertension: Secondary | ICD-10-CM | POA: Diagnosis present

## 2021-04-05 DIAGNOSIS — S83512A Sprain of anterior cruciate ligament of left knee, initial encounter: Secondary | ICD-10-CM | POA: Diagnosis present

## 2021-04-05 DIAGNOSIS — N390 Urinary tract infection, site not specified: Secondary | ICD-10-CM | POA: Diagnosis present

## 2021-04-05 DIAGNOSIS — G5702 Lesion of sciatic nerve, left lower limb: Secondary | ICD-10-CM | POA: Diagnosis present

## 2021-04-05 DIAGNOSIS — S83512D Sprain of anterior cruciate ligament of left knee, subsequent encounter: Secondary | ICD-10-CM | POA: Diagnosis not present

## 2021-04-05 DIAGNOSIS — S83522D Sprain of posterior cruciate ligament of left knee, subsequent encounter: Secondary | ICD-10-CM | POA: Diagnosis not present

## 2021-04-05 DIAGNOSIS — D62 Acute posthemorrhagic anemia: Secondary | ICD-10-CM | POA: Diagnosis present

## 2021-04-05 DIAGNOSIS — Z833 Family history of diabetes mellitus: Secondary | ICD-10-CM

## 2021-04-05 DIAGNOSIS — Z885 Allergy status to narcotic agent status: Secondary | ICD-10-CM

## 2021-04-05 DIAGNOSIS — Z9889 Other specified postprocedural states: Secondary | ICD-10-CM

## 2021-04-05 DIAGNOSIS — L298 Other pruritus: Secondary | ICD-10-CM | POA: Diagnosis not present

## 2021-04-05 DIAGNOSIS — R079 Chest pain, unspecified: Secondary | ICD-10-CM | POA: Diagnosis present

## 2021-04-05 DIAGNOSIS — S83106A Unspecified dislocation of unspecified knee, initial encounter: Secondary | ICD-10-CM | POA: Diagnosis present

## 2021-04-05 DIAGNOSIS — S83522A Sprain of posterior cruciate ligament of left knee, initial encounter: Secondary | ICD-10-CM | POA: Diagnosis present

## 2021-04-05 MED ORDER — BLOOD PRESSURE CONTROL BOOK
Freq: Once | Status: AC
  Filled 2021-04-05: qty 1

## 2021-04-05 MED ORDER — CEPHALEXIN 250 MG PO CAPS
500.0000 mg | ORAL_CAPSULE | Freq: Four times a day (QID) | ORAL | Status: DC
  Administered 2021-04-05 – 2021-04-06 (×3): 500 mg via ORAL
  Filled 2021-04-05 (×3): qty 2

## 2021-04-05 MED ORDER — ONDANSETRON HCL 4 MG PO TABS: 4.0000 mg | ORAL_TABLET | Freq: Four times a day (QID) | ORAL | Status: DC | PRN

## 2021-04-05 MED ORDER — METHOCARBAMOL 500 MG PO TABS
500.0000 mg | ORAL_TABLET | Freq: Four times a day (QID) | ORAL | Status: DC | PRN
  Administered 2021-04-05 – 2021-04-06 (×2): 500 mg via ORAL
  Filled 2021-04-05 (×3): qty 1

## 2021-04-05 MED ORDER — DOCUSATE SODIUM 100 MG PO CAPS
100.0000 mg | ORAL_CAPSULE | Freq: Two times a day (BID) | ORAL | Status: DC
  Administered 2021-04-07 – 2021-04-10 (×5): 100 mg via ORAL
  Filled 2021-04-05 (×8): qty 1

## 2021-04-05 MED ORDER — OXYCODONE HCL 5 MG PO TABS: 5.0000 mg | ORAL_TABLET | Freq: Four times a day (QID) | ORAL | 0 refills | Status: DC | PRN

## 2021-04-05 MED ORDER — METHOCARBAMOL 500 MG PO TABS
500.0000 mg | ORAL_TABLET | Freq: Three times a day (TID) | ORAL | 0 refills | Status: DC | PRN
Start: 2021-04-05 — End: 2021-04-11

## 2021-04-05 MED ORDER — CEPHALEXIN 500 MG PO CAPS: 500.0000 mg | ORAL_CAPSULE | Freq: Four times a day (QID) | ORAL | 0 refills | Status: DC

## 2021-04-05 MED ORDER — OXYCODONE HCL 5 MG PO TABS
5.0000 mg | ORAL_TABLET | ORAL | Status: DC | PRN
Start: 2021-04-05 — End: 2021-04-11
  Administered 2021-04-06: 22:00:00 5 mg via ORAL
  Administered 2021-04-06: 08:00:00 10 mg via ORAL
  Filled 2021-04-05 (×2): qty 2
  Filled 2021-04-05 (×2): qty 1

## 2021-04-05 MED ORDER — ACETAMINOPHEN 325 MG PO TABS
325.0000 mg | ORAL_TABLET | Freq: Four times a day (QID) | ORAL | Status: DC | PRN
Start: 2021-04-05 — End: 2021-04-11
  Administered 2021-04-05 – 2021-04-11 (×12): 650 mg via ORAL
  Filled 2021-04-05 (×12): qty 2

## 2021-04-05 MED ORDER — POLYETHYLENE GLYCOL 3350 17 G PO PACK: 17.0000 g | PACK | Freq: Every day | ORAL | Status: DC | PRN

## 2021-04-05 MED ORDER — RIVAROXABAN 10 MG PO TABS
10.0000 mg | ORAL_TABLET | Freq: Every day | ORAL | Status: DC
  Administered 2021-04-06 – 2021-04-11 (×6): 10 mg via ORAL
  Filled 2021-04-05 (×7): qty 1

## 2021-04-05 MED ORDER — POLYETHYLENE GLYCOL 3350 17 G PO PACK: 17.0000 g | PACK | Freq: Every day | ORAL | 0 refills | Status: DC | PRN

## 2021-04-05 MED ORDER — ONDANSETRON HCL 4 MG/2ML IJ SOLN: 4.0000 mg | Freq: Four times a day (QID) | INTRAMUSCULAR | Status: DC | PRN

## 2021-04-05 MED ORDER — RIVAROXABAN 10 MG PO TABS: 10.0000 mg | ORAL_TABLET | Freq: Every day | ORAL | 0 refills | Status: DC

## 2021-04-05 MED ORDER — METOPROLOL TARTRATE 25 MG PO TABS: 25.0000 mg | ORAL_TABLET | Freq: Two times a day (BID) | ORAL | 0 refills | Status: DC

## 2021-04-05 MED ORDER — HYDROCERIN EX CREA
TOPICAL_CREAM | Freq: Three times a day (TID) | CUTANEOUS | Status: DC | PRN
  Filled 2021-04-05 (×2): qty 113

## 2021-04-05 MED ORDER — METHOCARBAMOL 1000 MG/10ML IJ SOLN
500.0000 mg | Freq: Four times a day (QID) | INTRAVENOUS | Status: DC | PRN
  Filled 2021-04-05: qty 5

## 2021-04-05 MED ORDER — METOPROLOL TARTRATE 25 MG PO TABS
25.0000 mg | ORAL_TABLET | Freq: Two times a day (BID) | ORAL | Status: DC
  Administered 2021-04-05 – 2021-04-11 (×12): 25 mg via ORAL
  Filled 2021-04-05 (×12): qty 1

## 2021-04-05 MED ORDER — DOCUSATE SODIUM 100 MG PO CAPS: 100.0000 mg | ORAL_CAPSULE | Freq: Two times a day (BID) | ORAL | 0 refills | Status: DC

## 2021-04-05 MED ORDER — CELECOXIB 200 MG PO CAPS
200.0000 mg | ORAL_CAPSULE | Freq: Two times a day (BID) | ORAL | Status: DC
  Administered 2021-04-05 – 2021-04-11 (×12): 200 mg via ORAL
  Filled 2021-04-05 (×14): qty 1

## 2021-04-05 NOTE — H&P (Signed)
Physical Medicine and Rehabilitation Admission H&P    CC: L knee pain   HPI: [name] is a 30 year old right-handed female with unremarkable past medical history except obesity with BMI 67.17.  Per chart review lives with parent who physically cannot provide assist.  1 level home.  Per chart review patient works as a LawyerCNA.  Approximately 6 weeks ago patient sustained a low-energy knee dislocation while at work.  MRI showed injury to the ACL with PCL femoral avulsion of the lateral sided injury.  Presented 03/25/2021 with persistent increasing knee pain.  Patient underwent arthroscopic PCL reconstruction/arthroscopic ACL reconstruction using bone patellar tendon bone allograft/lateral posterior horn medial meniscal root repair.  Posterior lateral corner reconstruction with popliteal fibular ligament reconstruction and lateral collateral ligament reconstruction with peroneal nerve neurolysis 03/26/2021 per Dr. Rise PaganiniGregory Dean.  Knee immobilizer at all times nonweightbearing left lower extremity.  Maintained on Xarelto for DVT prophylaxis.  Acute blood loss anemia 9.6 and monitored.  Hospital course dysuria urinalysis WBC 11-20,000 few bacteria placed on Keflex 04/04/2021 empirically.  Therapy evaluations completed due to patient decreased functional mobility was admitted for a comprehensive rehab program.   Pt reports pain is tolerable- max 6/10- taking tylenol- no opiates since 12/23 and no IV pain meds since 12/17.  No rubbing from brace.  Says had dysuria, but better since Keflex started.      Review of Systems  Constitutional:  Negative for chills and fever.  HENT:  Negative for hearing loss.   Eyes:  Negative for blurred vision and double vision.  Respiratory:  Negative for shortness of breath.   Cardiovascular:  Positive for leg swelling. Negative for chest pain and palpitations.  Gastrointestinal:  Positive for constipation. Negative for heartburn, nausea and vomiting.  Genitourinary:   Negative for dysuria, flank pain and hematuria.  Musculoskeletal:  Positive for joint pain and myalgias.  Skin:  Negative for rash.  All other systems reviewed and are negative.     Past Medical History:  Diagnosis Date   Anemia     Asthma     Environmental allergies     Hypertension           Past Surgical History:  Procedure Laterality Date   ANTERIOR CRUCIATE LIGAMENT REPAIR Left 02/24/2021   KNEE ARTHROSCOPY WITH ANTERIOR CRUCIATE LIGAMENT (ACL) REPAIR WITH HAMSTRING GRAFT Left 03/25/2021    Procedure: left knee anterior cruciate ligament, posterior cruciate ligment, posterolateral corner reconstruction arthroscopy; allograft ligaments for reconstruction, meniscal debridement vs repair;  Surgeon: Cammy Copaean, Gregory Scott, MD;  Location[facility] OR;  Service: Orthopedics;  Laterality: Left;         Family History  Problem Relation Age of Onset   Hypertension Mother     Diabetes Mother     Hypertension Sister      Social History:  reports that she has never smoked. She has never used smokeless tobacco. She reports current alcohol use of about 1.0 standard drink per week. She reports that she does not use drugs. Allergies:      Allergies  Allergen Reactions   Morphine And Related Hives          Medications Prior to Admission  Medication Sig Dispense Refill   acetaminophen (TYLENOL) 500 MG tablet Take 1,000 mg by mouth every 6 (six) hours as needed for moderate pain.       ibuprofen (ADVIL) 600 MG tablet Take 1 tablet (600 mg total) by mouth every 6 (six) hours as needed. (Patient not taking:  Reported on 03/16/2021) 30 tablet 0   metroNIDAZOLE (FLAGYL) 500 MG tablet Take 1 tablet (500 mg total) by mouth 2 (two) times daily. (Patient not taking: Reported on 03/16/2021) 14 tablet 0   oxyCODONE-acetaminophen (PERCOCET/ROXICET) 5-325 MG tablet Take 1 tablet by mouth every 6 (six) hours as needed for severe pain. 20 tablet 0      Drug Regimen Review Drug regimen was reviewed and remains  appropriate with no significant issues identified   Home: Home Living Family/patient expects to be discharged to:: Private residence Living Arrangements: Parent Available Help at Discharge: Family Type of Home: House Home Access: Level entry Home Layout: One level Bathroom Shower/Tub: Engineer, manufacturing systems: Standard Home Equipment: Agricultural consultant (2 wheels), Software engineer History: Prior Function Prior Level of Function : Needs assist Physical Assist : ADLs (physical) Mobility Comments: Pt. states she has been using RW since knee injury 6 weeks ago and staying at her mom's house. Prior to injury was indepedent ADLs Comments: Mom has been assisting with ADLs   Functional Status:  Mobility: Bed Mobility Overal bed mobility: Needs Assistance Bed Mobility: Supine to Sit, Sit to Supine Rolling: Max assist Supine to sit: Min assist, HOB elevated Sit to supine: Min guard General bed mobility comments: Min A to move LLE to EOB, able to bring LLE into bed to return to supine. Transfers Overall transfer level: Needs assistance Equipment used: Rolling walker (2 wheels) Transfers: Sit to/from Stand Sit to Stand: Min assist Bed to/from chair/wheelchair/BSC transfer type:: Step pivot Stand pivot transfers: Min assist Step pivot transfers: Min assist  Lateral/Scoot Transfers: Mod assist, +2 physical assistance, +2 safety/equipment General transfer comment: Min A to steady in standing, stood from EOB x2, from wheelchair x1, SPT bed to/from w/c x1. Pt hypermobile in right knee. Ambulation/Gait Ambulation/Gait assistance: Min guard, Min assist Gait Distance (Feet): 20 Feet Assistive device: Rolling walker (2 wheels) Gait Pattern/deviations: Step-to pattern General Gait Details: Hop to gait with use of RW, compliant with NWb LLE. Fatigues quickly. 1 LOB when turning need Min A for support. Heavy reliance on UEs. Gait velocity: decreased Gait velocity interpretation:  <1.31 ft/sec, indicative of household Actuary mobility: Yes Wheelchair propulsion: Both upper extremities Wheelchair parts: Supervision/cueing Distance: 110 Wheelchair Assistance Details (indicate cue type and reason): Fatigues needing a few rest breaks.   ADL: ADL Overall ADL's : Needs assistance/impaired Eating/Feeding: Independent, Sitting Grooming: Wash/dry face, Wash/dry hands, Standing, Min guard Upper Body Bathing: Set up, Supervision/ safety, Sitting Upper Body Bathing Details (indicate cue type and reason): simulated Lower Body Bathing: Maximal assistance, Sitting/lateral leans Upper Body Dressing : Set up, Sitting Lower Body Dressing: Maximal assistance, Sitting/lateral leans Toilet Transfer: Minimal assistance, Min guard, Ambulation, Rolling walker (2 wheels), Regular Toilet, Grab bars Toilet Transfer Details (indicate cue type and reason): awaiting [facility] Toileting- Clothing Manipulation and Hygiene: Moderate assistance, Sit to/from stand Functional mobility during ADLs: Minimal assistance, Min guard, Rolling walker (2 wheels)   Cognition: Cognition Overall Cognitive Status: Within Functional Limits for tasks assessed Orientation Level: Oriented X4 Cognition Arousal/Alertness: Awake/alert Behavior During Therapy: WFL for tasks assessed/performed Overall Cognitive Status: Within Functional Limits for tasks assessed General Comments: Pt tearful in chair on arrival requesting back to bed due to pain.   Physical Exam: Blood pressure (!) 142/77, pulse 95, temperature 98.1 F (36.7 C), temperature source Oral, resp. rate 18, height 5\' 3"  (1.6 m), weight (!) 172 kg, last menstrual period 06/17/2020, SpO2 99 %. Physical Exam  Vitals and nursing note reviewed. Exam conducted with a chaperone present.  Constitutional:      Appearance: She is obese.     Comments: Pt BMI 67; sitting up in bed- transferred to bedside chair with min A with OT-  NT at bedside; -bright affect, NAD  HENT:     Head: Normocephalic and atraumatic.     Right Ear: External ear normal.     Left Ear: External ear normal.     Nose: Nose normal. No congestion.     Mouth/Throat:     Mouth: Mucous membranes are moist.     Pharynx: Oropharynx is clear. No oropharyngeal exudate.  Eyes:     General:        Right eye: No discharge.        Left eye: No discharge.     Extraocular Movements: Extraocular movements intact.  Cardiovascular:     Rate and Rhythm: Normal rate and regular rhythm.     Heart sounds: Normal heart sounds. No murmur heard.   No gallop.  Pulmonary:     Effort: Pulmonary effort is normal. No respiratory distress.     Breath sounds: Normal breath sounds. No stridor. No wheezing, rhonchi or rales.  Abdominal:     Comments: Protuberant; soft, NT, ND (+)BS- LBM today  Musculoskeletal:     Cervical back: Normal range of motion. No rigidity.     Comments: UE strength 5/5 in biceps, triceps, WE, grip and FA B/L  RLE- 5/5 in HF/KE/KF/DF and PF LLE- in KI- HF at least 3+/5; and DF/PF at least 3/5- painful   Skin:    General: Skin is warm and dry.     Comments: Surgical knee incision site with dressing in place clean dry and intact. Buttocks had a tiny pinpoint area that was open around rectum- otherwise, skin looked great- no rubbing from KI on LLE  Neurological:     General: No focal deficit present.     Mental Status: She is oriented to person, place, and time.     Comments: Patient is alert.  No acute distress.  Oriented x3 and follows commands. Intact to light touch in UE B/L and RLE- decreased slightly L knee and below  Psychiatric:        Mood and Affect: Mood normal.        Behavior: Behavior normal.      Lab Results Last 48 Hours        Results for orders placed or performed during the hospital encounter of 03/25/21 (from the past 48 hour(s))  Urinalysis, Routine w reflex microscopic Urine, Clean Catch     Status: Abnormal     Collection Time: 04/03/21 10:20 AM  Result Value Ref Range    Color, Urine YELLOW YELLOW    APPearance HAZY (A) CLEAR    Specific Gravity, Urine >1.030 (H) 1.005 - 1.030    pH 6.0 5.0 - 8.0    Glucose, UA NEGATIVE NEGATIVE mg/dL    Hgb urine dipstick LARGE (A) NEGATIVE    Bilirubin Urine NEGATIVE NEGATIVE    Ketones, ur NEGATIVE NEGATIVE mg/dL    Protein, ur NEGATIVE NEGATIVE mg/dL    Nitrite NEGATIVE NEGATIVE    Leukocytes,Ua NEGATIVE NEGATIVE      Comment: Performed at [facility], [place] [address], [place], [place] [address]  Urinalysis, Microscopic (reflex)     Status: Abnormal    Collection Time: 04/03/21 10:20 AM  Result Value Ref Range    RBC /  HPF 21-50 0 - 5 RBC/hpf    WBC, UA 11-20 0 - 5 WBC/hpf    Bacteria, UA FEW (A) NONE SEEN    Squamous Epithelial / LPF 21-50 0 - 5    Mucus PRESENT        Comment: Performed at [facility], [place] [address], [place], [place] [address]      Imaging Results (Last 48 hours)  No results found.           Medical Problem List and Plan: 1.  Debility functional deficits secondary to fall resulting in left knee dislocation/posterior and anterior cruciate ligament tear/lateral meniscal root avulsion/posterior lateral corner injury involving the popliteal fibular ligament and lateral collateral ligament/peroneal nerve scarring.S/P arthroscopic PCL ACL reconstruction/lateral posterior horn medial meniscal root repair/posterior lateral corner reconstruction popliteal fibular ligament and lateral collateral ligament reconstruction with peroneal nerve neurolysis 03/25/2021.  Nonweightbearing with knee immobilizer at all times             -patient may  shower- cover knee on L and use KI             -ELOS/Goals: 7-10 days supervision to mod I- mother cannot help physically 2.  Antithrombotics: -DVT/anticoagulation:  Pharmaceutical: Xarelto check vascular             -antiplatelet therapy: N/A 3. Pain Management: Celebrex 200 mg twice  daily, Robaxin as needed/oxycodone as needed- pt only using tylenol and robaxin currently.  4. Mood: Vita motional support             -antipsychotic agents: N/A 5. Neuropsych: This patient is capable of making decisions on her own behalf. 6. Skin/Wound Care: Routine skin checks 7. Fluids/Electrolytes/Nutrition: Routine in and outs with follow-up chemistries 8.  Acute blood loss anemia.  Follow-up CBC 9.  Hypertension.  Lopressor 25 mg twice daily.  Monitor with increased mobility 10.  Suspected UTI.  Empiric Keflex with urine culture pending- dysuria better with keflex on board.  11.  Morbid obesity.  BMI 67.17.  Dietary follow-up   I have personally performed a face to face diagnostic evaluation of this patient and formulated the key components of the plan.  Additionally, I have personally reviewed laboratory data, imaging studies, as well as relevant notes and concur with the physician assistant's documentation above.   The patient's status has not changed from the original H&P.  Any changes in documentation from the acute care chart have been noted above.         Lavon Paganini Angiulli, PA-C 04/05/2021

## 2021-04-05 NOTE — Progress Notes (Signed)
PMR Admission Coordinator Pre-Admission Assessment ° °Patient: Meagan Oneill is an 30 y.o., female °MRN: [id number] °DOB: 09/06/1990 °Height: 5' 3" (160 cm) °Weight: (!) 172 kg ° °Insurance Information °HMO:     PPO:      PCP:      IPA:      80/20:      OTHER:  °PRIMARY: none       ° °SECONDARY:       Policy#:      Phone#:  ° °Financial Counselor:       Phone#:  ° °The “Data Collection Information Summary” for patients in Inpatient Rehabilitation Facilities with attached “Privacy Act Statement-Health Care Records” was provided and verbally reviewed with: N/A ° °Emergency Contact Information °Contact Information   ° ° Name Relation Home Work Mobile  ° Sowash,Lula M Mother [phone]  [phone]  ° Broadnax,Shante Sister [phone]  [phone]  ° °  ° ° °Current Medical History  °Patient Admitting Diagnosis: L knee ACL reconstruction ° °History of Present Illness: Meagan Oneill is a 30-year-old right-handed female with unremarkable past medical history except obesity with BMI 67.17.  Per chart review lives with parent who physically cannot provide assist.  1 level home.  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  Approximately 6 weeks ago patient sustained a low-energy knee dislocation while at work.  MRI showed injury to the ACL with PCL femoral avulsion of the lateral sided injury.  Presented 03/25/2021 with persistent increasing knee pain.  Patient underwent arthroscopic PCL reconstruction/arthroscopic ACL reconstruction using bone patellar tendon bone allograft/lateral posterior horn medial meniscal root repair.  Posterior lateral corner reconstruction with popliteal fibular ligament reconstruction and lateral collateral ligament reconstruction with peroneal nerve neurolysis 03/26/2021 per Dr. Gregory Dean.  Knee immobilizer at all times nonweightbearing left lower extremity.  Maintained on Xarelto for DVT prophylaxis.  Acute blood loss anemia 9.6 and monitored.  Hospital course dysuria urinalysis WBC 11-20,000 few  bacteria placed on Keflex 04/04/2021 empirically.  Therapy evaluations completed due to patient decreased functional mobility was admitted for a comprehensive rehab program. ° ° °Patient's medical record from [facility] has been reviewed by the rehabilitation admission coordinator and physician. ° °Past Medical History  °Past Medical History:  °Diagnosis Date  ° Anemia   ° Asthma   ° Environmental allergies   ° Hypertension   ° ° °Has the patient had major surgery during 100 days prior to admission? Yes ° °Family History   °family history includes Diabetes in her mother; Hypertension in her mother and sister. ° °Current Medications ° °Current Facility-Administered Medications:  °  acetaminophen (TYLENOL) tablet 325-650 mg, 325-650 mg, Oral, Q6H PRN, Magnant, Charles L, PA-C, 650 mg at 04/04/21 1048 °  celecoxib (CELEBREX) capsule 200 mg, 200 mg, Oral, BID, Magnant, Charles L, PA-C, 200 mg at 04/05/21 0921 °  cephALEXin (KEFLEX) capsule 500 mg, 500 mg, Oral, Q6H, Nitka, James E, MD, 500 mg at 04/05/21 0647 °  Chlorhexidine Gluconate Cloth 2 % PADS 6 each, 6 each, Topical, Daily, Dean, Gregory Scott, MD, 6 each at 04/05/21 0922 °  docusate sodium (COLACE) capsule 100 mg, 100 mg, Oral, BID, Magnant, Charles L, PA-C, 100 mg at 04/03/21 0927 °  HYDROmorphone (DILAUDID) injection 0.5-1 mg, 0.5-1 mg, Intravenous, Q4H PRN, Magnant, Charles L, PA-C, 1 mg at 03/26/21 0851 °  methocarbamol (ROBAXIN) tablet 500 mg, 500 mg, Oral, Q6H PRN, 500 mg at 04/03/21 1610 **OR** methocarbamol (ROBAXIN) 500 mg in dextrose 5 % 50 mL IVPB, 500 mg,   Intravenous, Q6H PRN, Magnant, Charles L, PA-C °  metoCLOPramide (REGLAN) tablet 5-10 mg, 5-10 mg, Oral, Q8H PRN **OR** metoCLOPramide (REGLAN) injection 5-10 mg, 5-10 mg, Intravenous, Q8H PRN, Magnant, Charles L, PA-C °  metoprolol tartrate (LOPRESSOR) tablet 25 mg, 25 mg, Oral, BID, Branch, Mary E, MD, 25 mg at 04/05/21 0921 °  ondansetron (ZOFRAN) tablet 4 mg, 4 mg, Oral, Q6H PRN **OR**  ondansetron (ZOFRAN) injection 4 mg, 4 mg, Intravenous, Q6H PRN, Magnant, Charles L, PA-C °  oxyCODONE (Oxy IR/ROXICODONE) immediate release tablet 5-10 mg, 5-10 mg, Oral, Q4H PRN, Magnant, Charles L, PA-C, 5 mg at 04/01/21 0841 °  polyethylene glycol (MIRALAX / GLYCOLAX) packet 17 g, 17 g, Oral, Daily PRN, Dean, Gregory Scott, MD, 17 g at 03/30/21 0941 °  rivaroxaban (XARELTO) tablet 10 mg, 10 mg, Oral, Daily, Paytes, Austin A, RPH, 10 mg at 04/05/21 0921 ° °Patients Current Diet:  °Diet Order   ° °       °  Diet regular Room service appropriate? Yes; Fluid consistency: Thin  Diet effective now       °  ° °  °  ° °  ° ° °Precautions / Restrictions °Precautions °Precautions: Fall °Restrictions °Weight Bearing Restrictions: Yes °LLE Weight Bearing: Non weight bearing  ° °Has the patient had 2 or more falls or a fall with injury in the past year? Yes ° °Prior Activity Level °Community (5-7x/wk): Was working Ft and went out daily ° °Prior Functional Level °Self Care: Did the patient need help bathing, dressing, using the toilet or eating? Independent ° °Indoor Mobility: Did the patient need assistance with walking from room to room (with or without device)? Needed some help ° °Stairs: Did the patient need assistance with internal or external stairs (with or without device)? Needed some help ° °Functional Cognition: Did the patient need help planning regular tasks such as shopping or remembering to take medications? Independent ° °Patient Information °Are you of Hispanic, Latino/a,or Spanish origin?: A. No, not of Hispanic, Latino/a, or Spanish origin °What is your race?: B. Black or African American °Do you need or want an interpreter to communicate with a doctor or health care staff?: 0. No ° °Patient's Response To:  °Health Literacy and Transportation °Is the patient able to respond to health literacy and transportation needs?: Yes °Health Literacy - How often do you need to have someone help you when you read  instructions, pamphlets, or other written material from your doctor or pharmacy?: Never °In the past 12 months, has lack of transportation kept you from medical appointments or from getting medications?: No °In the past 12 months, has lack of transportation kept you from meetings, work, or from getting things needed for daily living?: No ° °Home Assistive Devices / Equipment °Home Assistive Devices/Equipment: None °Home Equipment: Rolling Walker (2 wheels), Shower seat ° °Prior Device Use: Indicate devices/aids used by the patient prior to current illness, exacerbation or injury? None of the above.  Did use a wheel chair in the home most recently. ° °Current Functional Level °Cognition ° Overall Cognitive Status: Within Functional Limits for tasks assessed °Orientation Level: Oriented X4 °General Comments: Pt tearful in chair on arrival requesting back to bed due to pain. °   °Extremity Assessment °(includes Sensation/Coordination) ° Upper Extremity Assessment: Overall WFL for tasks assessed  °Lower Extremity Assessment: Defer to PT evaluation °LLE Deficits / Details: Grossly 1/5 °LLE: Unable to fully assess due to pain  °  °ADLs ° Overall ADL's : Needs   assistance/impaired °Eating/Feeding: Independent, Sitting °Grooming: Wash/dry face, Wash/dry hands, Standing, Min guard °Upper Body Bathing: Set up, Supervision/ safety, Sitting °Upper Body Bathing Details (indicate cue type and reason): simulated °Lower Body Bathing: Maximal assistance, Sitting/lateral leans °Upper Body Dressing : Set up, Sitting °Lower Body Dressing: Maximal assistance, Sitting/lateral leans °Toilet Transfer: Minimal assistance, Min guard, Ambulation, Rolling walker (2 wheels), Regular Toilet, Grab bars °Toilet Transfer Details (indicate cue type and reason): awaiting bari BSC °Toileting- Clothing Manipulation and Hygiene: Moderate assistance, Sit to/from stand °Functional mobility during ADLs: Minimal assistance, Min guard, Rolling walker (2  wheels)  °  °Mobility ° Overal bed mobility: Needs Assistance °Bed Mobility: Supine to Sit, Sit to Supine °Rolling: Max assist °Supine to sit: Min assist, HOB elevated °Sit to supine: Min guard °General bed mobility comments: Min A to move LLE to EOB, able to bring LLE into bed to return to supine.  °  °Transfers ° Overall transfer level: Needs assistance °Equipment used: Rolling walker (2 wheels) °Transfers: Sit to/from Stand °Sit to Stand: Min assist °Bed to/from chair/wheelchair/BSC transfer type:: Step pivot °Stand pivot transfers: Min assist °Step pivot transfers: Min assist ° Lateral/Scoot Transfers: Mod assist, +2 physical assistance, +2 safety/equipment °General transfer comment: Min A to steady in standing, stood from EOB x2, from wheelchair x1, SPT bed to/from w/c x1. Pt hypermobile in right knee.  °  °Ambulation / Gait / Stairs / Wheelchair Mobility ° Ambulation/Gait °Ambulation/Gait assistance: Min guard, Min assist °Gait Distance (Feet): 20 Feet °Assistive device: Rolling walker (2 wheels) °Gait Pattern/deviations: Step-to pattern °General Gait Details: Hop to gait with use of RW, compliant with NWb LLE. Fatigues quickly. 1 LOB when turning need Min A for support. Heavy reliance on UEs. °Gait velocity: decreased °Gait velocity interpretation: <1.31 ft/sec, indicative of household ambulator °Wheelchair Mobility °Wheelchair mobility: Yes °Wheelchair propulsion: Both upper extremities °Wheelchair parts: Supervision/cueing °Distance: 110 °Wheelchair Assistance Details (indicate cue type and reason): Fatigues needing a few rest breaks.  °  °Posture / Balance Balance °Overall balance assessment: Needs assistance °Sitting-balance support: Feet supported, No upper extremity supported °Sitting balance-Leahy Scale: Fair °Standing balance support: During functional activity, Reliant on assistive device for balance °Standing balance-Leahy Scale: Poor °Standing balance comment: relies on UE support for balance  °   °Special needs/care consideration Continuous Drip IV  KVO, Wound Vac Yes to Left knee, and Skin L post op knee incision with dressing and knee immobilizer  ° °Previous Home Environment (from acute therapy documentation) °Living Arrangements: Parent °Available Help at Discharge: Family °Type of Home: House °Home Layout: One level °Home Access: Level entry °Bathroom Shower/Tub: Tub/shower unit °Bathroom Toilet: Standard °Home Care Services: No ° °Discharge Living Setting °Plans for Discharge Living Setting: Lives with (comment), Apartment (Will go home with her mother.) °Type of Home at Discharge: Apartment °Discharge Home Layout: One level °Discharge Home Access: Level entry °Discharge Bathroom Shower/Tub: Tub/shower unit, Curtain °Discharge Bathroom Toilet: Handicapped height °Discharge Bathroom Accessibility: Yes °How Accessible: Accessible via walker °Does the patient have any problems obtaining your medications?: No ° °Social/Family/Support Systems °Patient Roles: Other (Comment) (Has mom and sisters.) °Contact Information: Lula Deford - mom °Anticipated Caregiver: mom °Anticipated Caregiver's Contact Information: Luma Stockert - m0m [phone] °Ability/Limitations of Caregiver: Mom is not working and can provide supervision to light assist. °Caregiver Availability: 24/7 °Discharge Plan Discussed with Primary Caregiver: Yes °Is Caregiver In Agreement with Plan?: Yes °Does Caregiver/Family have Issues with Lodging/Transportation while Pt is in Rehab?: No ° °Goals °Patient/Family Goal for Rehab: PT/OT supervision   to min Assist goals °Expected length of stay: 7-10 days °Pt/Family Agrees to Admission and willing to participate: Yes °Program Orientation Provided & Reviewed with Pt/Caregiver Including Roles  & Responsibilities: Yes ° °Decrease burden of Care through IP rehab admission: N/A ° °Possible need for SNF placement upon discharge: Not planned ° °Patient Condition: I have reviewed medical records from [facility], spoken with CM, and patient and family member. I met with patient at the bedside for inpatient rehabilitation assessment.  Patient will benefit from ongoing PT and OT, can actively participate in 3 hours of therapy a day 5 days of the week, and can make measurable gains during the admission.  Patient will also benefit from the coordinated team approach during an Inpatient Acute Rehabilitation admission.  The patient will receive intensive therapy as well as Rehabilitation physician, nursing, social worker, and care management interventions.  Due to bladder management, bowel management, safety, skin/wound care, disease management, medication administration, pain management, and patient education the patient requires 24 hour a day rehabilitation nursing.  The patient is currently min A with mobility and basic ADLs.  Discharge setting and therapy post discharge at home with home health is anticipated.  Patient has agreed to participate in the Acute Inpatient Rehabilitation Program and will admit today. ° °Preadmission Screen Completed By:  Alyss Granato B Trina Asch, 04/05/2021 12:22 PM with day of updates by Jacen Carlini °______________________________________________________________________   °Discussed status with Dr. Lovorn on 04/05/21 at 930 and received approval for admission today. ° °Admission Coordinator:  Ellenie Salome, MS, CCC-SLP °, time 1220 /Date 04/05/21  ° °Assessment/Plan: °Diagnosis: °Does the need for close, 24 hr/day Medical supervision in concert with the patient's rehab needs make it unreasonable for this patient to be served in a less intensive setting? Yes °Co-Morbidities requiring supervision/potential complications: BMI 67; NWB LLE- multiligament- knee injury; UTI- on keflex; HTN; asthma °Due to bladder management, bowel management, safety, skin/wound care, disease management, medication administration, pain management, and patient education, does the patient require 24 hr/day rehab nursing?  Yes °Does the patient require coordinated care of a physician, rehab nurse, PT, OT, and SLP to address physical and functional deficits in the context of the above medical diagnosis(es)? Yes °Addressing deficits in the following areas: balance, endurance, locomotion, strength, transferring, bowel/bladder control, bathing, dressing, feeding, grooming, and toileting °Can the patient actively participate in an intensive therapy program of at least 3 hrs of therapy 5 days a week? Yes °The potential for patient to make measurable gains while on inpatient rehab is good °Anticipated functional outcomes upon discharge from inpatient rehab: supervision and min assist PT, supervision and min assist OT, n/a SLP °Estimated rehab length of stay to reach the above functional goals is: 7-10 days °Anticipated discharge destination: Home °10. Overall Rehab/Functional Prognosis: good ° ° °MD Signature: ° °

## 2021-04-05 NOTE — Progress Notes (Signed)
Inpatient Rehab Admissions Coordinator:   I have a bed for this Pt. On CIR and will plan to admit her today. RN may call report to [phone].  Megan Salon, MS, CCC-SLP Rehab Admissions Coordinator  [phone] (celll) [phone] (office)

## 2021-04-05 NOTE — Progress Notes (Signed)
Occupational Therapy Treatment Patient Details Name: Meagan Oneill MRN: [id number] DOB: Jan 18, 1991 Today's Date: 04/05/2021   History of present illness Pt. is 30 yr old F admitted on 12/16 admitted for planned surgery to correct multiligament knee injury that occured 6 weeks ago.  Sx included L ACL, PCL, popliteal fibular ligament, lateral collateral ligament, and laternal meniscus root repair. PMH: anemia, asthma, HTN   OT comments  Pt making good progress with functional goals. Session focused getting OOB, sit - stand using RW to transfer to recliner for bathing and dressing, instruction on LB ADL techniques. OT will continue to follow acutely to maximize level of function and safety   Recommendations for follow up therapy are one component of a multi-disciplinary discharge planning process, led by the attending physician.  Recommendations may be updated based on patient status, additional functional criteria and insurance authorization.    Follow Up Recommendations  Acute inpatient rehab (3hours/day)    Assistance Recommended at Discharge Frequent or constant Supervision/Assistance  Equipment Recommendations  BSC/3in1;Tub/shower bench;Wheelchair (measurements OT);Wheelchair cushion (measurements OT)    Recommendations for Other Services      Precautions / Restrictions Precautions Precautions: Fall Required Braces or Orthoses: Knee Immobilizer - Left Knee Immobilizer - Left: On at all times Restrictions Weight Bearing Restrictions: Yes LLE Weight Bearing: Non weight bearing       Mobility Bed Mobility Overal bed mobility: Needs Assistance Bed Mobility: Supine to Sit     Supine to sit: Min guard;HOB elevated     General bed mobility comments: min guard A for safety, no physical assist required    Transfers Overall transfer level: Needs assistance Equipment used: Rolling walker (2 wheels) Transfers: Sit to/from Stand Sit to Stand: Min assist Stand pivot transfers: Min  assist Step pivot transfers: Min assist             Balance Overall balance assessment: Needs assistance Sitting-balance support: Feet supported;No upper extremity supported Sitting balance-Leahy Scale: Fair     Standing balance support: During functional activity;Reliant on assistive device for balance Standing balance-Leahy Scale: Poor                             ADL either performed or assessed with clinical judgement   ADL Overall ADL's : Needs assistance/impaired     Grooming: Wash/dry face;Wash/dry hands;Set up;Sitting   Upper Body Bathing: Set up;Supervision/ safety;Sitting   Lower Body Bathing: Maximal assistance;Moderate assistance;Sitting/lateral leans   Upper Body Dressing : Set up;Sitting       Toilet Transfer: Minimal assistance;Min guard;Rollator (4 wheels)   Toileting- Clothing Manipulation and Hygiene: Minimal assistance;Sit to/from stand       Functional mobility during ADLs: Minimal assistance;Min guard;Rolling walker (2 wheels)      Extremity/Trunk Assessment Upper Extremity Assessment Upper Extremity Assessment: Overall WFL for tasks assessed   Lower Extremity Assessment Lower Extremity Assessment: Defer to PT evaluation   Cervical / Trunk Assessment Cervical / Trunk Assessment: Normal    Vision Baseline Vision/History: 0 No visual deficits Ability to See in Adequate Light: 0 Adequate Patient Visual Report: No change from baseline     Perception     Praxis      Cognition Arousal/Alertness: Awake/alert Behavior During Therapy: WFL for tasks assessed/performed Overall Cognitive Status: Within Functional Limits for tasks assessed  Exercises     Shoulder Instructions       General Comments      Pertinent Vitals/ Pain       Pain Assessment: Faces Faces Pain Scale: Hurts little more Breathing: normal Negative Vocalization: none Facial Expression:  smiling or inexpressive Body Language: relaxed Pain Location: L knee Pain Descriptors / Indicators: Aching;Burning;Discomfort Pain Intervention(s): Monitored during session;Repositioned;Premedicated before session  Home Living                                          Prior Functioning/Environment              Frequency  Min 2X/week        Progress Toward Goals  OT Goals(current goals can now be found in the care plan section)  Progress towards OT goals: Progressing toward goals     Plan Discharge plan remains appropriate;Frequency remains appropriate    Co-evaluation                 AM-PAC OT "6 Clicks" Daily Activity     Outcome Measure   Help from another person eating meals?: None Help from another person taking care of personal grooming?: A Little Help from another person toileting, which includes using toliet, bedpan, or urinal?: A Lot Help from another person bathing (including washing, rinsing, drying)?: A Lot Help from another person to put on and taking off regular upper body clothing?: None Help from another person to put on and taking off regular lower body clothing?: A Lot 6 Click Score: 17    End of Session Equipment Utilized During Treatment: Left knee immobilizer;Gait belt;Rolling walker (2 wheels)  OT Visit Diagnosis: Other abnormalities of gait and mobility (R26.89);Pain Pain - Right/Left: Left Pain - part of body: Knee   Activity Tolerance Patient tolerated treatment well   Patient Left in chair;with call bell/phone within reach;with nursing/sitter in room   Nurse Communication          Time: 3007-6226 OT Time Calculation (min): 26 min  Charges: OT General Charges $OT Visit: 1 Visit OT Treatments $Self Care/Home Management : 8-22 mins $Therapeutic Activity: 8-22 mins    Galen Manila 04/05/2021, 2:29 PM

## 2021-04-05 NOTE — Discharge Instructions (Addendum)
Inpatient Rehab Discharge Instructions  [place] Discharge date and time: 04/11/21   Activities/Precautions/ Functional Status: Activity: NO weight on Left leg and wear knee immobilizer at all times Diet: Regular Wound Care: Routine skin checks.   Functional status:  ___ No restrictions     ___ Walk up steps independently ___ 24/7 supervision/assistance   ___ Walk up steps with assistance _X__ Intermittent supervision/assistance  ___ Bathe/dress independently ___ Walk with walker     __x_ Bathe/dress with assistance ___ Walk Independently    ___ Shower independently ___ Walk with assistance    ___ Shower with assistance ___ No alcohol     ___ Return to work/school ________   Special Instructions: No driving smoking or alcohol    COMMUNITY REFERRALS UPON DISCHARGE:    HOME EXERCISE PROGRAM DUE TO UNINSURED AND NO CHARITY HOME HEALTH WOULD ACCEPT REFERRAL WILL NEED OPPT ONCE WEIGHT BEARING  Medical Equipment/Items Ordered:WHEELCHAIR                                                 Agency/Supplier:[facility]  MATCH FOR PRESCRIPTION ASSISTANCE    My questions have been answered and I understand these instructions. I will adhere to these goals and the provided educational materials after my discharge from the hospital.  Patient/Caregiver Signature _______________________________ Date __________  Clinician Signature _______________________________________ Date __________  Please bring this form and your medication list with you to all your follow-up doctor's appointments.

## 2021-04-05 NOTE — Progress Notes (Signed)
°  Mobility Specialist Criteria Algorithm Info.   04/05/21 1130  Mobility  Activity Ambulated to bathroom;Transferred:  Bed to chair  Range of Motion/Exercises Active;All extremities  Level of Assistance Contact guard assist, steadying assist  Assistive Device Front wheel walker  LLE Weight Bearing WBAT  Distance Ambulated (ft) 25 ft  Mobility Ambulated with assistance in room;Out of bed for toileting  Mobility Response Tolerated well  Mobility performed by Mobility specialist  Bed Position Chair   Patient received in bed, eager to participate in mobility. Completed LE bed exercises then requested to ambulate to bathroom. Required min A to assist LLE to EOB. Stood min guard with cues for hand placement. Pt then hop to bathroom and was left with NT present. Tolerated ambulating to bathroom well without complaint or incident.   04/05/2021 2:32 PM

## 2021-04-05 NOTE — Progress Notes (Addendum)
Patient arrived to unit and oriented. No complaints of pain, all questions answered. Surgical dressing intact on lateral left leg. Removed tegederm and covered 5 incisions on L knee with foams. All with suture closure, 3 with steri strips still intact. Surrounding skin unremarkable, no drainage.  R foot very dry with flaky skin, patient states has been troublesome for years.  will ask for eucerin cream

## 2021-04-05 NOTE — Plan of Care (Signed)

## 2021-04-05 NOTE — Progress Notes (Signed)
Inpatient Rehabilitation Admission Medication Review by a Pharmacist  A complete drug regimen review was completed for this patient to identify any potential clinically significant medication issues.  High Risk Drug Classes Is patient taking? Indication by Medication  Antipsychotic No   Anticoagulant Yes Xarelto- VTE prophylaxis  Antibiotic Yes Keflex- UTI  Opioid Yes oxyIR- pain  Antiplatelet No   Hypoglycemics/insulin No   Vasoactive Medication Yes [place]- hypertension  Chemotherapy No   Other No      Type of Medication Issue Identified Description of Issue Recommendation(s)  Drug Interaction(s) (clinically significant)     Duplicate Therapy     Allergy     No Medication Administration End Date     Incorrect Dose     Additional Drug Therapy Needed     Significant med changes from prior encounter (inform family/care partners about these prior to discharge).    Other       Clinically significant medication issues were identified that warrant physician communication and completion of prescribed/recommended actions by midnight of the next day:  No  Name of provider notified for urgent issues identified:   Provider Method of Notification:     Pharmacist comments:   Time spent performing this drug regimen review (minutes):  30    Emalia Witkop BS, PharmD, BCPS Clinical Pharmacist 04/05/2021 2:43 PM

## 2021-04-06 ENCOUNTER — Inpatient Hospital Stay (HOSPITAL_COMMUNITY): Payer: Self-pay

## 2021-04-06 DIAGNOSIS — S83282S Other tear of lateral meniscus, current injury, left knee, sequela: Secondary | ICD-10-CM

## 2021-04-06 DIAGNOSIS — M7989 Other specified soft tissue disorders: Secondary | ICD-10-CM

## 2021-04-06 DIAGNOSIS — A499 Bacterial infection, unspecified: Secondary | ICD-10-CM

## 2021-04-06 DIAGNOSIS — N390 Urinary tract infection, site not specified: Secondary | ICD-10-CM

## 2021-04-06 DIAGNOSIS — S83512S Sprain of anterior cruciate ligament of left knee, sequela: Secondary | ICD-10-CM

## 2021-04-06 DIAGNOSIS — D62 Acute posthemorrhagic anemia: Secondary | ICD-10-CM

## 2021-04-06 LAB — COMPREHENSIVE METABOLIC PANEL
ALT: 13 U/L (ref 0–44)
AST: 13 U/L — ABNORMAL LOW (ref 15–41)
Albumin: 2.6 g/dL — ABNORMAL LOW (ref 3.5–5.0)
Alkaline Phosphatase: 62 U/L (ref 38–126)
Anion gap: 6 (ref 5–15)
BUN: 9 mg/dL (ref 6–20)
CO2: 27 mmol/L (ref 22–32)
Calcium: 8.9 mg/dL (ref 8.9–10.3)
Chloride: 104 mmol/L (ref 98–111)
Creatinine, Ser: 0.7 mg/dL (ref 0.44–1.00)
GFR, Estimated: >60 mL/min (ref >60)
Glucose, Bld: 103 mg/dL — ABNORMAL HIGH (ref 70–99)
Potassium: 3.6 mmol/L (ref 3.5–5.1)
Sodium: 137 mmol/L (ref 135–145)
Total Bilirubin: 0.8 mg/dL (ref 0.3–1.2)
Total Protein: 6.5 g/dL (ref 6.5–8.1)

## 2021-04-06 LAB — CBC WITH DIFFERENTIAL/PLATELET
Abs Immature Granulocytes: 0.03 10*3/uL (ref 0.00–0.07)
Basophils Absolute: 0.1 10*3/uL (ref 0.0–0.1)
Basophils Relative: 1 %
Eosinophils Absolute: 0.2 10*3/uL (ref 0.0–0.5)
Eosinophils Relative: 2 %
HCT: 29.9 % — ABNORMAL LOW (ref 36.0–46.0)
Hemoglobin: 9.1 g/dL — ABNORMAL LOW (ref 12.0–15.0)
Immature Granulocytes: 0 %
Lymphocytes Relative: 33 %
Lymphs Abs: 3 10*3/uL (ref 0.7–4.0)
MCH: 20.4 pg — ABNORMAL LOW (ref 26.0–34.0)
MCHC: 30.4 g/dL (ref 30.0–36.0)
MCV: 66.9 fL — ABNORMAL LOW (ref 80.0–100.0)
Monocytes Absolute: 0.5 10*3/uL (ref 0.1–1.0)
Monocytes Relative: 5 %
Neutro Abs: 5.3 10*3/uL (ref 1.7–7.7)
Neutrophils Relative %: 59 %
Platelets: 230 10*3/uL (ref 150–400)
RBC: 4.47 MIL/uL (ref 3.87–5.11)
RDW: 20 % — ABNORMAL HIGH (ref 11.5–15.5)
WBC: 9 10*3/uL (ref 4.0–10.5)
nRBC: 0.2 % (ref 0.0–0.2)

## 2021-04-06 MED ORDER — CEPHALEXIN 250 MG PO CAPS
500.0000 mg | ORAL_CAPSULE | Freq: Two times a day (BID) | ORAL | Status: AC
  Administered 2021-04-06 – 2021-04-08 (×5): 500 mg via ORAL
  Filled 2021-04-06 (×5): qty 2

## 2021-04-06 MED ORDER — CEPHALEXIN 250 MG PO CAPS: 500.0000 mg | ORAL_CAPSULE | Freq: Two times a day (BID) | ORAL | Status: DC

## 2021-04-06 MED ORDER — ENSURE MAX PROTEIN PO LIQD
11.0000 [oz_av] | Freq: Every day | ORAL | Status: DC
  Administered 2021-04-06 – 2021-04-11 (×6): 11 [oz_av] via ORAL

## 2021-04-06 NOTE — Progress Notes (Signed)
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   Severe obesity (BMI >= 40) (HCC)  Past Medical History:  Past Medical History:  Diagnosis Date   Anemia    Asthma    Environmental allergies    Hypertension    Past Surgical History:  Past Surgical History:  Procedure Laterality Date   ANTERIOR CRUCIATE LIGAMENT REPAIR Left 02/24/2021   KNEE ARTHROSCOPY WITH ANTERIOR CRUCIATE LIGAMENT (ACL) REPAIR WITH HAMSTRING GRAFT Left 03/25/2021   Procedure: left knee anterior cruciate ligament, posterior cruciate ligment, posterolateral corner reconstruction arthroscopy; allograft ligaments for reconstruction, meniscal debridement vs repair;  Surgeon: Cammy Copa, MD;  Location: [facility] OR;  Service: Orthopedics;  Laterality: Left;   Social History:  reports that she has never smoked. She has never used smokeless tobacco. She reports current alcohol use of about 1.0 standard drink per week. She reports that she does not use drugs.  Family / Support Systems Marital Status: Single Patient Roles: Other (Comment) (Daughter and sibling) Other Supports: Lula-mom [phone]  Shante-sister [phone] Anticipated Caregiver: mom Ability/Limitations of Caregiver: Mom is disabled but can only provide supervision-light min assist Caregiver Availability: 24/7 Family Dynamics: Close knit family who have stepped up to assist pt and will continue to assist and provide what she needs.  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 Issues: Currently working with lawyer to assist with her workers comp soince both facility and contracted employer are not taking responsiblity Guardian/Conservator: None-according to MD pt is capable of making her own decisions   Abuse/Neglect Abuse/Neglect Assessment Can Be Completed: Yes Physical Abuse: Denies Verbal Abuse: Denies Sexual Abuse: Denies Exploitation of patient/patient's resources: Denies Self-Neglect: Denies  Patient response to: Social Isolation - How often do you feel lonely or isolated from those around you?: Often  Emotional Status Pt's affect, behavior and adjustment status: Pt is motivated to recover and regain her independence. She is doing well and moving better than she thought she would be. She waited a month before they would do surgery, so perfected the use of a walker Recent Psychosocial Issues: other health issues Psychiatric History: No history since all this has happened has affected her and would benefit from seeing neuro-psych while here. will be short length of stay so will see if can be seen while here Substance Abuse History: No issues  Patient / Family Perceptions, Expectations & Goals Pt/Family understanding of illness & functional limitations: Pt can explain her surgery and WB restrictions, she talks with the MD daily and feels has a good understanding of her treatment moving forward. Premorbid pt/family roles/activities: Employee, sibling, daughter, cousin, friend, etc Anticipated changes in roles/activities/participation: resume Pt/family  expectations/goals: Pt states: ' I  want to be as independent as possible before going home my family is going to a funeral in [place] and will be gone for four days I will be alone then."  [employer]: None Premorbid Home Care/DME Agencies: Other (Comment) (has rw and bsc) Transportation available at discharge: Family for now she did drive PTA Is the patient able to respond to transportation needs?: Yes In the past 12 months, has lack of transportation kept you from medical appointments or from getting medications?: No In the past 12 months, has lack of transportation kept you from meetings, work, or from getting things needed for daily living?: No Resource referrals recommended: Neuropsychology  Discharge Planning Living Arrangements: Parent Support Systems: Parent, Other relatives, Friends/neighbors Type of Residence: Private residence Insurance Resources: Futures trader (Working with Clinical research associate on Workers Comp) Architect: Employment Surveyor, quantity Screen Referred: Yes Living Expenses: Lives with family Money Management: Patient Does the patient have any problems obtaining your medications?: Yes (Describe) (uninsured) Home Management: self mom since knee issues11/15 Patient/Family Preliminary Plans: Since knee isue 02/2021 has been staying with her MOm. Her Mom is disabled and can be there but not offer much assistance. Pt needs to be mod/i since family all going out of town for funeral and will be gone a few days. Aware evaluating er and setting goals. Care Coordinator Barriers to Discharge: Insurance for SNF coverage, Decreased caregiver support, Weight Care Coordinator Anticipated Follow Up Needs: HH/OP  Clinical Impression Pleasant female who is motivated to do well and is high level already. She will need to be mod/I prior to DC due to family leaving the state to go to her Aunt's funeral and will be gone four days. Pt requests a wheelchair before DC. Will work on  discharge needs.  Lucy Chris 04/06/2021, 10:11 AM

## 2021-04-06 NOTE — Progress Notes (Signed)
Inpatient Rehabilitation  Patient information reviewed and entered into eRehab system by Gaither Biehn Michiel Sivley, OTR/L.   Information including medical coding, functional ability and quality indicators will be reviewed and updated through discharge.    

## 2021-04-06 NOTE — Evaluation (Signed)
Physical Therapy Assessment and Plan  Patient Details  Name: Meagan Oneill MRN: [id number] Date of Birth: 1990/12/04  PT Diagnosis: Abnormal posture, Abnormality of gait, Difficulty walking, Edema, Muscle weakness, and Pain in LLE Rehab Potential: Good ELOS: 5-7 days   Today's Date: 04/06/2021 PT Individual Time: 2248-2500 PT Individual Time Calculation (min): 70 min    Hospital Problem: Principal Problem:   Knee dislocation Active Problems:   S/P left knee surgery   Rupture of posterior cruciate ligament of left knee   Left anterior cruciate ligament tear   Acute lateral meniscus tear of left knee   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   Severe obesity (BMI >= 40) ([place])   Past Medical History:  Past Medical History:  Diagnosis Date   Anemia    Asthma    Environmental allergies    Hypertension    Past Surgical History:  Past Surgical History:  Procedure Laterality Date   ANTERIOR CRUCIATE LIGAMENT REPAIR Left 02/24/2021   KNEE ARTHROSCOPY WITH ANTERIOR CRUCIATE LIGAMENT (ACL) REPAIR WITH HAMSTRING GRAFT Left 03/25/2021   Procedure: left knee anterior cruciate ligament, posterior cruciate ligment, posterolateral corner reconstruction arthroscopy; allograft ligaments for reconstruction, meniscal debridement vs repair;  Surgeon: Meredith Pel, MD;  Location: [place];  Service: Orthopedics;  Laterality: Left;    Assessment & Plan Clinical Impression: Patient is a 30 y.o. year old female with unremarkable past medical history except obesity with BMI 67.17.  Per chart review lives with parent who physically cannot provide assist.  1 level home.  Per chart review patient works as a Quarry manager.  Approximately 6 weeks ago patient sustained a low-energy knee dislocation while at work.  MRI showed injury to the ACL with PCL femoral avulsion of the lateral sided injury.  Presented 03/25/2021 with persistent increasing knee pain.  Patient underwent arthroscopic PCL  reconstruction/arthroscopic ACL reconstruction using bone patellar tendon bone allograft/lateral posterior horn medial meniscal root repair.  Posterior lateral corner reconstruction with popliteal fibular ligament reconstruction and lateral collateral ligament reconstruction with peroneal nerve neurolysis 03/26/2021 per Dr. Marcene Duos.  Knee immobilizer at all times nonweightbearing left lower extremity.  Maintained on Xarelto for DVT prophylaxis.  Acute blood loss anemia 9.6 and monitored.  Hospital course dysuria urinalysis WBC 11-20,000 few bacteria placed on Keflex 04/04/2021 empirically.  Therapy evaluations completed due to patient decreased functional mobility was admitted for a comprehensive rehab program.  Patient currently requires min with mobility secondary to muscle weakness and muscle joint tightness, decreased cardiorespiratoy endurance, and decreased standing balance, decreased postural control, decreased balance strategies, and difficulty maintaining precautions.  Prior to hospitalization, patient was independent  with mobility and lived with Family in a House home.  Home access is  Level entry.  Patient will benefit from skilled PT intervention to maximize safe functional mobility, minimize fall risk, and decrease caregiver burden for planned discharge home with intermittent assist.  Anticipate patient will benefit from follow up [facility] at discharge.  PT - End of Session Activity Tolerance: Tolerates 30+ min activity with multiple rests Endurance Deficit: Yes Endurance Deficit Description: required rest break after gait PT Assessment Rehab Potential (ACUTE/IP ONLY): Good PT Barriers to Discharge: Decreased caregiver support;Home environment access/layout;Wound Care;Weight;Weight bearing restrictions PT Barriers to Discharge Comments: D/C to mom's house but mom will only be able to provide intermittent assist, LLE NWB precautions PT Patient demonstrates impairments in the following  area(s): Balance;Edema;Endurance;Nutrition;Skin Integrity;Pain PT Transfers Functional Problem(s): Bed Mobility;Bed to Chair;Car;Furniture PT Locomotion Functional Problem(s):  Ambulation;Wheelchair Mobility;Stairs PT Plan PT Intensity: Minimum of 1-2 x/day ,45 to 90 minutes PT Frequency: 5 out of 7 days PT Duration Estimated Length of Stay: 5-7 days PT Treatment/Interventions: Ambulation/gait training;Discharge planning;Functional mobility training;Psychosocial support;Balance/vestibular training;Disease management/prevention;Neuromuscular re-education;Skin care/wound management;Therapeutic Exercise;Wheelchair propulsion/positioning;DME/adaptive equipment instruction;Pain management;Splinting/orthotics;UE/LE Strength taining/ROM;Community reintegration;Patient/family education;Stair training;UE/LE Coordination activities PT Transfers Anticipated Outcome(s): Mod I PT Locomotion Anticipated Outcome(s): Mod I PT Recommendation Follow Up Recommendations: Home health PT Patient destination: Home Equipment Recommended: To be determined Equipment Details: has bedside commode, RW, and crutches   PT Evaluation Precautions/Restrictions Precautions Precautions: Fall Required Braces or Orthoses: Knee Immobilizer - Left Knee Immobilizer - Left: On at all times Restrictions Weight Bearing Restrictions: Yes LLE Weight Bearing: Non weight bearing Pain Interference Pain Interference Pain Effect on Sleep: 3. Frequently Pain Interference with Therapy Activities: 2. Occasionally Pain Interference with Day-to-Day Activities: 4. Almost constantly Home Living/Prior Functioning Home Living Living Arrangements: Parent Available Help at Discharge: Family;Available PRN/intermittently (pt will be D/C to mother's home but mother is going to [place] for 4 days) Type of Home: House Home Access: Level entry Home Layout: One level Bathroom Shower/Tub: Chiropodist: Standard Bathroom  Accessibility: Yes Additional Comments: already has bedside commode, RW, and crutches  Lives With: Family Prior Function Level of Independence: Independent with basic ADLs;Independent with transfers;Independent with homemaking with ambulation;Independent with gait  Able to Take Stairs?: Yes Driving: Yes 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 Vision/Perception  Vision - History Ability to See in Adequate Light: 0 Adequate Perception Perception: Within Functional Limits Praxis Praxis: Intact  Cognition Overall Cognitive Status: Within Functional Limits for tasks assessed Arousal/Alertness: Awake/alert Orientation Level: Oriented X4 Year: 2022 Month: December Day of Week: Correct Memory: Appears intact Immediate Memory Recall: Sock;Bed;Blue Memory Recall Sock: Without Cue Memory Recall Blue: Without Cue Memory Recall Bed: Without Cue Awareness: Appears intact Problem Solving: Appears intact Safety/Judgment: Appears intact Sensation Sensation Light Touch: Appears Intact Hot/Cold: Appears Intact Proprioception: Appears Intact Stereognosis: Appears Intact Additional Comments: burning around incision Coordination Gross Motor Movements are Fluid and Coordinated: Yes Fine Motor Movements are Fluid and Coordinated: Yes Finger Nose Finger Test: [facility] bilaterally Heel Shin Test: unable to lift LLE, unable to perform on RLE due to body habitus Motor  Motor Motor: Within Functional Limits Motor - Skilled Clinical Observations: generalized weakness/deconditioning  Trunk/Postural Assessment  Cervical Assessment Cervical Assessment: Exceptions to [facility] (forward head) Thoracic Assessment Thoracic Assessment: Exceptions to [facility] (rounded shoulders) Lumbar Assessment Lumbar Assessment: Exceptions to [facility] (posterior pelvic tilt) Postural Control Postural Control: Within Functional Limits  Balance Balance Balance Assessed: Yes Static Sitting Balance Static Sitting -  Balance Support: Feet supported;Bilateral upper extremity supported Static Sitting - Level of Assistance: 5: Stand by assistance (supervision) Dynamic Sitting Balance Dynamic Sitting - Balance Support: Feet supported;No upper extremity supported Dynamic Sitting - Level of Assistance: 5: Stand by assistance (supervision) Static Standing Balance Static Standing - Balance Support: Bilateral upper extremity supported (RW) Static Standing - Level of Assistance: 5: Stand by assistance (CGA) Dynamic Standing Balance Dynamic Standing - Balance Support: Bilateral upper extremity supported (RW) Dynamic Standing - Level of Assistance: 4: Min assist Dynamic Standing - Comments: transfers and gait Extremity Assessment  RLE Assessment RLE Assessment: Exceptions to [facility] General Strength Comments: not formally tested, grossly 4-/5 LLE Assessment LLE Assessment: Exceptions to [facility] General Strength Comments: grossly generalized to 3-/5  Care Tool Care Tool Bed Mobility Roll left and right activity   Roll left and right assist level: Supervision/Verbal  cueing    Sit to lying activity   Sit to lying assist level: Supervision/Verbal cueing    Lying to sitting on side of bed activity   Lying to sitting on side of bed assist level: the ability to move from lying on the back to sitting on the side of the bed with no back support.: Supervision/Verbal cueing     Care Tool Transfers Sit to stand transfer   Sit to stand assist level: Minimal Assistance - Patient > 75%    Chair/bed transfer   Chair/bed transfer assist level: Minimal Assistance - Patient > 75%     Toilet transfer   Assist Level: Minimal Assistance - Patient > 75%    Car transfer   Car transfer assist level: Minimal Assistance - Patient > 75%      Care Tool Locomotion Ambulation   Assist level: Minimal Assistance - Patient > 75% Assistive device: Walker-rolling Max distance: 64ft  Walk 10 feet activity   Assist level: Minimal  Assistance - Patient > 75% Assistive device: Walker-rolling   Walk 50 feet with 2 turns activity Walk 50 feet with 2 turns activity did not occur: Safety/medical concerns (fatigue, weakness, LLE NWB precautions, body habitus)      Walk 150 feet activity Walk 150 feet activity did not occur: Safety/medical concerns (fatigue, weakness, LLE NWB precautions, body habitus)      Walk 10 feet on uneven surfaces activity Walk 10 feet on uneven surfaces activity did not occur: Safety/medical concerns (fatigue, weakness, LLE NWB precautions, body habitus)      Stairs Stair activity did not occur: Safety/medical concerns (fatigue, weakness, LLE NWB precautions, body habitus)        Walk up/down 1 step activity Walk up/down 1 step or curb (drop down) activity did not occur: Safety/medical concerns (fatigue, weakness, LLE NWB precautions, body habitus)      Walk up/down 4 steps activity Walk up/down 4 steps activity did not occur: Safety/medical concerns (fatigue, weakness, LLE NWB precautions, body habitus)      Walk up/down 12 steps activity Walk up/down 12 steps activity did not occur: Safety/medical concerns (fatigue, weakness, LLE NWB precautions, body habitus)      Pick up small objects from floor Pick up small object from the floor (from standing position) activity did not occur: Safety/medical concerns (fatigue, weakness, LLE NWB precautions, body habitus)      Wheelchair Is the patient using a wheelchair?: Yes Type of Wheelchair: Manual   Wheelchair assist level: Supervision/Verbal cueing Max wheelchair distance: 81ft  Wheel 50 feet with 2 turns activity   Assist Level: Dependent - Patient 0%  Wheel 150 feet activity   Assist Level: Dependent - Patient 0%    Refer to Care Plan for Long Term Goals  SHORT TERM GOAL WEEK 1 PT Short Term Goal 1 (Week 1): STG=LTG due to LOS  Recommendations for other services: None   Skilled Therapeutic Intervention Evaluation completed (see  details above and below) with education on PT POC and goals and individual treatment initiated with focus on functional mobility/transfers, generalized strengthening, dynamic standing balance/coordination, ambulation, simulated car transfer, and improved activity tolerance. Received pt semi-reclined in bed, pt educated on PT evaluation, CIR policies, and therapy schedule and agreeable. Pt denied any pain during session - premedicated. Pt able to recall LLE NWB precautions and donned knee immobilizer with supervision. Pt transferred semi-reclined<>sitting EOB with HOB elevated and use of bedrails with supervision. Stand<>pivot bed<>WC with RW and min A while maintaining LLE NWB  precautions and provided pt with L elevating legrest for WC. Pt performed WC mobility 84ft using BUE and supervision but limited due to fatigue. Pt transported to ortho gym in [facility] total A and performed simulated car transfer with RW and min A. Pt unable to flex L knee to get into car, therefore sat long sitting. Discussed technique for getting in/out of her high sitting jeep. Sit<>stand with RW and min A and ambulated 72ft with RW and min A with close WC follow. Pt transported back to room in [facility] total A and requested to return to bed. Stand<>pivot WC<>bed with RW and CGA and sit<>supine with supervision. Concluded session with pt semi-reclined in bed, needs within reach, and bed alarm on.   Mobility Bed Mobility Bed Mobility: Rolling Right;Rolling Left;Sit to Supine;Supine to Sit Rolling Right: Supervision/verbal cueing Rolling Left: Supervision/Verbal cueing Supine to Sit: Supervision/Verbal cueing Sit to Supine: Supervision/Verbal cueing Transfers Transfers: Sit to Stand;Stand to Sit;Stand Pivot Transfers Sit to Stand: Minimal Assistance - Patient > 75% Stand to Sit: Contact Guard/Touching assist Stand Pivot Transfers: Minimal Assistance - Patient > 75% Transfer (Assistive device): Rolling walker Locomotion  Gait Ambulation:  Yes Gait Assistance: Minimal Assistance - Patient > 75% Gait Distance (Feet): 20 Feet Assistive device: Rolling walker Gait Assistance Details: Verbal cues for precautions/safety;Verbal cues for safe use of DME/AE;Verbal cues for gait pattern Gait Assistance Details: verbal cues to avoid landing forcefully on RLE Gait Gait: Yes Gait Pattern: Impaired Gait Pattern: Step-to pattern;Antalgic;Decreased stride length;Decreased step length - right;Poor foot clearance - right Gait velocity: decreased Stairs / Additional Locomotion Stairs: No Architect: Yes Wheelchair Assistance: Chartered loss adjuster: Both upper extremities Wheelchair Parts Management: Needs assistance Distance: 51ft   Discharge Criteria: Patient will be discharged from PT if patient refuses treatment 3 consecutive times without medical reason, if treatment goals not met, if there is a change in medical status, if patient makes no progress towards goals or if patient is discharged from hospital.  The above assessment, treatment plan, treatment alternatives and goals were discussed and mutually agreed upon: by patient  Alfonse Alpers PT, DPT  04/06/2021, 11:57 AM

## 2021-04-06 NOTE — Progress Notes (Signed)
[facility] Individual Statement of Services  Patient Name:  Meagan Oneill  Date:  04/06/2021  Welcome to the [facility].  Our goal is to provide you with an individualized program based on your diagnosis and situation, designed to meet your specific needs.  With this comprehensive rehabilitation program, you will be expected to participate in at least 3 hours of rehabilitation therapies Monday-Friday, with modified therapy programming on the weekends.  Your rehabilitation program will include the following services:  Physical Therapy (PT), Occupational Therapy (OT), 24 hour per day rehabilitation nursing, Therapeutic Recreaction (TR), Neuropsychology, Care Coordinator, Rehabilitation Medicine, Nutrition Services, and Pharmacy Services  Weekly team conferences will be held on Tuesday to discuss your progress.  Your Inpatient Rehabilitation Care Coordinator will talk with you frequently to get your input and to update you on team discussions.  Team conferences with you and your family in attendance may also be held.  Expected length of stay: 5-7 days  Overall anticipated outcome: independent with device  Depending on your progress and recovery, your program may change. Your Inpatient Rehabilitation Care Coordinator will coordinate services and will keep you informed of any changes. Your Inpatient Rehabilitation Care Coordinator's name and contact numbers are listed  below.  The following services may also be recommended but are not provided by the Inpatient [facility]:  Driving Evaluations Home Health Rehabiltiation Services Outpatient Rehabilitation Services Vocational Rehabilitation   Arrangements will be made to provide these services after discharge if needed.  Arrangements include referral to agencies that provide these services.  Your insurance has been verified to be:  Working with Clinical research associate on Workers Comp-otherwise uninsured Your primary  doctor is:  None  Pertinent information will be shared with your doctor and your insurance company.  Inpatient Rehabilitation Care Coordinator:  Dossie Der, [place] [phone] or ([phone]  Information discussed with and copy given to patient by: Lucy Chris, 04/06/2021, 10:14 AM

## 2021-04-06 NOTE — Evaluation (Signed)
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   Severe obesity (BMI >= 40) (HCC)   Past Medical History:  Past Medical History:  Diagnosis Date   Anemia    Asthma    Environmental allergies    Hypertension    Past Surgical History:  Past Surgical History:  Procedure Laterality Date   ANTERIOR CRUCIATE LIGAMENT REPAIR Left 02/24/2021   KNEE ARTHROSCOPY WITH ANTERIOR CRUCIATE LIGAMENT (ACL) REPAIR WITH HAMSTRING GRAFT Left 03/25/2021   Procedure: left knee anterior cruciate ligament, posterior cruciate ligment, posterolateral corner reconstruction arthroscopy; allograft ligaments for reconstruction, meniscal debridement vs repair;  Surgeon: Meredith Pel, MD;  Location: [place];  Service: Orthopedics;  Laterality: Left;    Assessment & Plan Clinical Impression:  Meagan Oneill is a 30 year old right-handed female with unremarkable past medical history except obesity with BMI 67.17.  Per chart review lives with parent who physically cannot provide assist.  1 level home.  Per chart review patient works as a Quarry manager.  Approximately 6 weeks ago patient sustained a low-energy knee dislocation while at work.  MRI showed injury to the ACL with PCL femoral avulsion of the lateral sided injury.  Presented 03/25/2021 with persistent increasing knee pain.  Patient underwent arthroscopic PCL  reconstruction/arthroscopic ACL reconstruction using bone patellar tendon bone allograft/lateral posterior horn medial meniscal root repair.  Posterior lateral corner reconstruction with popliteal fibular ligament reconstruction and lateral collateral ligament reconstruction with peroneal nerve neurolysis 03/26/2021 per Dr. Marcene Duos.  Knee immobilizer at all times nonweightbearing left lower extremity.  Maintained on Xarelto for DVT prophylaxis.  Acute blood loss anemia 9.6 and monitored.  Hospital course dysuria urinalysis WBC 11-20,000 few bacteria placed on Keflex 04/04/2021 empirically.  Therapy evaluations completed due to patient decreased functional mobility was admitted for a comprehensive rehab program. Patient transferred to CIR on 04/05/2021 .    Patient currently requires min with basic self-care skills secondary to pain,  muscle weakness and decreased balance strategies.  Prior to hospitalization, patient was fully independent.  Patient will benefit from skilled intervention to increase independence with basic self-care skills prior to discharge home with care partner.  Anticipate patient will require intermittent supervision and no further OT follow recommended.  OT - End of Session Endurance Deficit: Yes Endurance Deficit Description: required rest break after gait OT Assessment Rehab Potential (ACUTE ONLY): Excellent OT Patient demonstrates impairments in the following area(s): Balance;Motor;Pain OT Basic ADL's Functional Problem(s): Bathing;Dressing;Toileting OT Advanced ADL's Functional Problem(s): Simple Meal Preparation OT Transfers Functional Problem(s): Toilet OT Additional Impairment(s): None OT Plan OT Intensity: Minimum of 1-2 x/day, 45 to 90 minutes OT Frequency: 5 out of 7 days OT Duration/Estimated Length of Stay: 5-7 days OT Treatment/Interventions: Balance/vestibular training;Discharge planning;DME/adaptive equipment instruction;Functional mobility  training;Psychosocial support;Patient/family education;Self Care/advanced ADL retraining;Therapeutic Exercise;Therapeutic Activities;Pain management OT Self Feeding Anticipated Outcome(s): no goal, pt is independent OT Basic Self-Care Anticipated Outcome(s): mod I for  bathing and dressing - sponge baths only OT Toileting Anticipated Outcome(s): mod I OT Bathroom Transfers Anticipated Outcome(s): mod I to [facility] OT Recommendation Patient destination: Home Follow Up Recommendations: Home health OT Equipment Recommended: None recommended by OT Equipment Details: pt already has a RW and BSC   OT Evaluation Precautions/Restrictions  Precautions Precautions: Fall Required Braces or Orthoses: Knee Immobilizer - Left Knee Immobilizer - Left: On at all times Restrictions Weight Bearing Restrictions: Yes LLE Weight Bearing: Non weight bearing  Pain Pain Assessment Pain Score: 6  Home Living/Prior Functioning Home Living Family/patient expects to be discharged to:: Private residence Living Arrangements: Parent Available Help at Discharge: Family, Available PRN/intermittently (pt will be D/C to mother's home but mother is going to [place] for 4 days) Type of Home: House Home Access: Level entry Home Layout: One level Bathroom Shower/Tub: Government social research officer Accessibility: Yes Additional Comments: already has bedside commode, RW, and crutches  Lives With: Family Prior Function Level of Independence: Independent with basic ADLs, Independent with transfers, Independent with homemaking with ambulation, Independent with gait  Able to Take Stairs?: Yes Driving: Yes 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 Vision Baseline Vision/History: 0 No visual deficits Ability to See in Adequate Light: 0 Adequate Patient Visual Report: No change from baseline Vision Assessment?: No apparent visual deficits Perception  Perception: Within Functional  Limits Praxis Praxis: Intact Cognition Overall Cognitive Status: Within Functional Limits for tasks assessed Arousal/Alertness: Awake/alert Orientation Level: Person;Place;Situation Person: Oriented Place: Oriented Situation: Oriented Year: 2022 Month: December Day of Week: Correct Memory: Appears intact Immediate Memory Recall: Sock;Bed;Blue Memory Recall Sock: Without Cue Memory Recall Blue: Without Cue Memory Recall Bed: Without Cue Awareness: Appears intact Problem Solving: Appears intact Safety/Judgment: Appears intact Sensation Sensation Light Touch: Appears Intact Hot/Cold: Appears Intact Proprioception: Appears Intact Stereognosis: Appears Intact Additional Comments: burning around incision Coordination Gross Motor Movements are Fluid and Coordinated: Yes Fine Motor Movements are Fluid and Coordinated: Yes Finger Nose Finger Test: [facility] bilaterally Heel Shin Test: unable to lift LLE, unable to perform on RLE due to body habitus Motor  Motor Motor: Within Functional Limits Motor - Skilled Clinical Observations: generalized weakness/deconditioning  Trunk/Postural Assessment  Cervical Assessment Cervical Assessment: Exceptions to [facility] (forward head) Thoracic Assessment Thoracic Assessment: Exceptions to [facility] (rounded shoulders) Lumbar Assessment Lumbar Assessment: Exceptions to [facility] (posterior pelvic tilt) Postural Control Postural Control: Within Functional Limits  Balance Balance Balance Assessed: Yes Static Sitting Balance Static Sitting - Balance Support: Feet supported;Bilateral upper extremity supported Static Sitting - Level of Assistance: 5: Stand by assistance (supervision) Dynamic Sitting Balance Dynamic Sitting - Balance Support: Feet supported;No upper extremity supported Dynamic Sitting - Level of Assistance: 5: Stand by assistance (supervision) Static Standing Balance Static Standing - Balance Support: Bilateral upper extremity supported  (RW) Static Standing - Level of Assistance: 5: Stand by assistance (CGA) Dynamic Standing Balance Dynamic Standing - Balance Support: Bilateral upper extremity supported (RW) Dynamic Standing - Level of Assistance: 4: Min assist Dynamic Standing - Comments: transfers and gait Extremity/Trunk Assessment RUE Assessment RUE Assessment: Within Functional Limits LUE Assessment LUE Assessment: Within Functional Limits  Care Tool Care Tool Self Care Eating   Eating Assist Level: Independent    Oral Care    Oral Care Assist Level: Set up assist    Bathing   Body parts bathed by patient: Right arm;Left arm;Chest;Abdomen;Front perineal area;Right upper leg;Left upper leg;Right lower leg;Face Body parts bathed by helper: Buttocks Body parts n/a: Left lower leg  Assist Level: Minimal Assistance - Patient > 75%    Upper Body Dressing(including orthotics)   What is the patient wearing?: Dress   Assist Level: Set up assist    Lower Body Dressing (excluding footwear) Lower body dressing activity did not occur: N/A (pt wears dresses and no underwear)        Putting on/Taking off footwear   What is the patient wearing?: Non-skid slipper socks Assist for footwear: Moderate Assistance - Patient 50 - 74%       Care Tool Toileting Toileting activity   Assist for toileting: Moderate Assistance - Patient 50 - 74%     Care Tool Bed Mobility Roll left and right activity   Roll left and right assist level: Supervision/Verbal cueing    Sit to lying activity   Sit to lying assist level: Supervision/Verbal cueing    Lying to sitting on side of bed activity   Lying to sitting on side of bed assist level: the ability to move from lying on the back to sitting on the side of the bed with no back support.: Supervision/Verbal cueing     Care Tool Transfers Sit to stand transfer   Sit to stand assist level: Minimal Assistance - Patient > 75%    Chair/bed transfer   Chair/bed transfer assist  level: Minimal Assistance - Patient > 75%     Toilet transfer   Assist Level: Minimal Assistance - Patient > 75%     Care Tool Cognition  Expression of Ideas and Wants Expression of Ideas and Wants: 4. Without difficulty (complex and basic) - expresses complex messages without difficulty and with speech that is clear and easy to understand  Understanding Verbal and Non-Verbal Content Understanding Verbal and Non-Verbal Content: 4. Understands (complex and basic) - clear comprehension without cues or repetitions   Memory/Recall Ability Memory/Recall Ability : That he or she is in a hospital/hospital unit;Location of own room;Current season   Refer to Care Plan for [place] OT Short Term Goal 1 (Week 1): STGs = LTGs  Recommendations for other services: None    Skilled Therapeutic Intervention ADL ADL Eating: Independent Grooming: Independent Upper Body Bathing: Setup Lower Body Bathing: Minimal assistance Where Assessed-Lower Body Bathing: Other (Comment) (BSC and bed) Upper Body Dressing: Independent Lower Body Dressing: Minimal assistance (only using dresses and non slip sock on feet) Toileting: Moderate assistance Where Assessed-Toileting: Bedside Commode Toilet Transfer: Contact guard Toilet Transfer Method: Stand pivot Toilet Transfer Equipment: Extra wide bedside commode  Bed Mobility Bed Mobility: Rolling Right;Rolling Left;Sit to Supine;Supine to Sit Rolling Right: Supervision/verbal cueing Rolling Left: Supervision/Verbal cueing Supine to Sit: Supervision/Verbal cueing Sit to Supine: Supervision/Verbal cueing Transfers Sit to Stand: Minimal Assistance - Patient > 75% Stand to Sit: Contact Guard/Touching assist  Pt seen for initial evaluation and ADL training with a focus on adaptive techniques. 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  Pt was able to explain well what she is struggling with now and even before her accident. Pt  very receptive to trying adaptive strategies. For bed mobility, she did well using the gait belt as a leg lifter to enable her to both get out and get into bed with S. CGA to stand to RW.  She can stand with B hands on RW with S but does not feel secure enough to release 1 hand to balance on R leg only to do LB self care needs. Provided pt  with long handled sponge and toilet aid. Pt did practice with toilet aid and was able to do a good majority of the cleansing. She may do better with wipes vs the wash cloths.  Pt is only going to use dresses and no underwear during this time frame of needing the long KI.  Discussed possible LOS, pt's goals, POC.  Pt resting in bed with all needs met.    Discharge Criteria: Patient will be discharged from OT if patient refuses treatment 3 consecutive times without medical reason, if treatment goals not met, if there is a change in medical status, if patient makes no progress towards goals or if patient is discharged from hospital.  The above assessment, treatment plan, treatment alternatives and goals were discussed and mutually agreed upon: by patient  [facility] 04/06/2021, 12:05 PM

## 2021-04-06 NOTE — Progress Notes (Signed)
Called to room secondary to patient verbalizing discomfort of mid chest pain with some occasional SOB. 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 She stated she was feeing anxious.Continue to monitor and assist, call bell, bedside table and bed alarm on

## 2021-04-06 NOTE — Progress Notes (Signed)
Physical Therapy Session Note  Patient Details  Name: Meagan Oneill MRN: [id number] Date of Birth: Jan 20, 1991  Today's Date: 04/06/2021 PT Individual Time: 1400-1454 PT Individual Time Calculation (min): 54 min   Short Term Goals: Week 1:  PT Short Term Goal 1 (Week 1): STG=LTG due to LOS  Skilled Therapeutic Interventions/Progress Updates:   Received pt supine in bed asleep. Upon wakening pt lethargic and required increased time to arouse. Pt agreeable to PT treatment and denied any pain during session. Session with emphasis on functional mobility/transfers, generalized strengthening, dynamic standing balance/coordination, toileting, and improved endurance. 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 Donned knee immobilizer with min A and pt performed bed mobility with HOB elevated and use of bedrail with supervision and reported urge to have BM. Sit<>stand with RW and CGA while maintaining LLE NWB precautions and stand<>pivot bed<>bedside commode with RW and CGA. Pt required increased time on commode but continent of bowel. Attempted to perform peri-care standing with total A but ultimately returned to bed for pt to continue peri-care care semi-reclined with supervision. Sit<>stand from low bed with RW and and CGA and performed the following exercises standing with BUE support and CGA for balance:  -single leg heel raises 2x10 -LLE hip flexion 2x10 -LLE hip abduction 2x10 -LLE hip extension 1x10 -seated hip adduction pillow squeezes 15x3 second isometric hold. Sit<>supine with supervision. Concluded session with pt semi-reclined in bed, needs within reach, and bed alarm on.   Therapy Documentation Precautions:  Restrictions Weight Bearing Restrictions: Yes LLE Weight Bearing: Non weight bearing  Therapy/Group: Individual Therapy Martin Majestic PT, DPT   04/06/2021, 7:31 AM

## 2021-04-06 NOTE — Progress Notes (Addendum)
PROGRESS NOTE   Subjective/Complaints: Pt states she had reasonable night. Apparently had some chest pain which she did not expressed to me. Only reports pain in left knee as well as calf. Does report anxiety at times  ROS: Patient denies fever, rash, sore throat, blurred vision, nausea, vomiting, diarrhea, cough, shortness of breath or chest pain   Objective:   No results found. Recent Labs    04/06/21 0639  WBC 9.0  HGB 9.1*  HCT 29.9*  PLT 230   Recent Labs    04/06/21 0639  NA 137  K 3.6  CL 104  CO2 27  GLUCOSE 103*  BUN 9  CREATININE 0.70  CALCIUM 8.9    Intake/Output Summary (Last 24 hours) at 04/06/2021 0904 Last data filed at 04/06/2021 0735 Gross per 24 hour  Intake 720 ml  Output --  Net 720 ml        Physical Exam: Vital Signs Blood pressure 122/72, pulse 82, temperature (!) 97.3 F (36.3 C), resp. rate 17, height 5\' 4"  (1.626 m), SpO2 100 %.  General: Alert and oriented x 3, No apparent distress. obese HEENT: Head is normocephalic, atraumatic, PERRLA, EOMI, sclera anicteric, oral mucosa pink and moist, dentition intact, ext ear canals clear,  Neck: Supple without JVD or lymphadenopathy Heart: Reg rate and rhythm. No murmurs rubs or gallops Chest: CTA bilaterally without wheezes, rales, or rhonchi; no distress Abdomen: Soft, non-tender, non-distended, bowel sounds positive. Extremities: No clubbing, cyanosis, or edema. Pulses are 2+ Psych: Pt's affect is appropriate. Pt is cooperative Skin: Clean and intact without signs of breakdown Neuro:  Alert and oriented x 3. Normal insight and awareness. Intact Memory. Normal language and speech. Cranial nerve exam unremarkable. UE motor 5/5. LLE limited by pain, able to move ankle without issue. RLE 3/5 prox to 3-4/5 distally, limited by pain. No sensory findings Musculoskeletal: wounds dressed, clean left knee, foam dressings. Left calf sl tender  with palpation. LLE with generalized swelling    Assessment/Plan: 1. Functional deficits which require 3+ hours per day of interdisciplinary therapy in a comprehensive inpatient rehab setting. Physiatrist is providing close team supervision and 24 hour management of active medical problems listed below. Physiatrist and rehab team continue to assess barriers to discharge/monitor patient progress toward functional and medical goals  Care Tool:  Bathing              Bathing assist       Upper Body Dressing/Undressing Upper body dressing        Upper body assist      Lower Body Dressing/Undressing Lower body dressing            Lower body assist       Toileting Toileting    Toileting assist       Transfers Chair/bed transfer  Transfers assist           Locomotion Ambulation   Ambulation assist              Walk 10 feet activity   Assist           Walk 50 feet activity   Assist  Walk 150 feet activity   Assist           Walk 10 feet on uneven surface  activity   Assist           Wheelchair     Assist               Wheelchair 50 feet with 2 turns activity    Assist            Wheelchair 150 feet activity     Assist          Blood pressure 122/72, pulse 82, temperature (!) 97.3 F (36.3 C), resp. rate 17, height 5\' 4"  (1.626 m), SpO2 100 %.  Medical Problem List and Plan: 1.  Debility functional deficits secondary to fall resulting in left knee dislocation/posterior and anterior cruciate ligament tear/lateral meniscal root avulsion/posterior lateral corner injury involving the popliteal fibular ligament and lateral collateral ligament/peroneal nerve scarring.S/P arthroscopic PCL ACL reconstruction/lateral posterior horn medial meniscal root repair/posterior lateral corner reconstruction popliteal fibular ligament and lateral collateral ligament reconstruction with peroneal  nerve neurolysis 03/25/2021.   -Nonweightbearing with knee immobilizer at all times             -patient may  shower- cover knee on L and use KI             -ELOS/Goals: 7-10 days supervision to mod I- mother cannot help physically  Patient is beginning CIR therapies today including PT and OT  2.  Antithrombotics: -DVT/anticoagulation:  Pharmaceutical: Xarelto  -check dopplers, + calf pain             -antiplatelet therapy: N/A 3. Pain Management: Celebrex 200 mg twice daily, Robaxin as needed/oxycodone as needed- pt only using tylenol and robaxin currently.  4. Mood: Vita motional support             -antipsychotic agents: N/A 5. Neuropsych: This patient is capable of making decisions on her own behalf. 6. Skin/Wound Care: Routine skin checks 7. Fluids/Electrolytes/Nutrition: encourage po  I personally reviewed the patient's labs today.    -add protein supps for low albumin 8.  Acute blood loss anemia.  hgb sl down to 9.1 no gross blood loss  -recheck Friday  9.  Hypertension.  Lopressor 25 mg twice daily.  Monitor with increased mobility 10.  Suspected UTI.  Empiric Keflex   -no culture ordered - dysuria better with keflex on board.---continue 5 days empirically 11.  Morbid obesity.  BMI 67.17.  Dietary follow-up    LOS: 1 days A FACE TO FACE EVALUATION WAS PERFORMED  Meredith Staggers 04/06/2021, 9:04 AM

## 2021-04-06 NOTE — Progress Notes (Signed)
Bilateral lower extremity venous duplex has been completed. Preliminary results can be found in CV Proc through chart review.   04/06/21 2:16 PM Olen Cordial RVT

## 2021-04-07 MED ORDER — TRAMADOL HCL 50 MG PO TABS
50.0000 mg | ORAL_TABLET | Freq: Four times a day (QID) | ORAL | Status: DC | PRN
  Administered 2021-04-07: 14:00:00 50 mg via ORAL
  Filled 2021-04-07: qty 1

## 2021-04-07 NOTE — Progress Notes (Signed)
Physical Therapy Session Note  Patient Details  Name: Meagan Oneill MRN: [id number] Date of Birth: 1990-06-05  Today's Date: 04/07/2021 PT Missed Time: 45 Minutes Missed Time Reason: Patient fatigue   Received pt supine in bed asleep. Upon wakening pt reported fatigue from having back to back therapies this morning. Pt denied any pain but requested to sleep. Assisted with repositioning in bed for comfort and will attempt to make up time as able. 45 minutes missed of skilled physical therapy due to fatigue.   Therapy Documentation Precautions:  Precautions Precautions: Fall Required Braces or Orthoses: Knee Immobilizer - Left Knee Immobilizer - Left: On at all times Restrictions Weight Bearing Restrictions: Yes LLE Weight Bearing: Non weight bearing  Therapy/Group: Individual Therapy Martin Majestic PT, DPT   04/07/2021, 7:30 AM

## 2021-04-07 NOTE — Progress Notes (Signed)
Patient ID: Meagan Oneill, female   DOB: [birth year], 31 y.o.   MRN: [id number] per pt's request she wants this worker to order her a wheelchair, placed order via [employer]. Pt aware to expect a call from them. She has other pieces of equipment needed.

## 2021-04-07 NOTE — Progress Notes (Signed)
Physical Therapy Session Note  Patient Details  Name: Meagan Oneill MRN: [id number] Date of Birth: Nov 27, 1990  Today's Date: 04/07/2021 PT Individual Time: 3142-7670 PT Individual Time Calculation (min): 70 min   Short Term Goals: Week 1:  PT Short Term Goal 1 (Week 1): STG=LTG due to LOS  Skilled Therapeutic Interventions/Progress Updates: Pt presented in bed agreeable to therapy. Pt states pain currently 4-5/10, premedicated and rest breaks provided as needed. PTA donned KI total A and pt requesting to wash up prior to OOB. PTA set up wash basin and pt performed upper body and LB cleaning at bed level mod I. Performed supine to sit with supervision and use of bed features. Pt then performed Sit to stand and hop pivot transfer to w/c with CGA. Pt propelled w/c to day room with supervision for general conditioning. Performed ambulatory transfer to high/low mat with CGA and RW and participated in standing tolerance activity. Pt stood and participated in bean bag toss for ~2 min. Pt able to tolerate moderate challenges outside BOS without LOB and good adherence to NWB precautions. After seated rest pt participated in x 3 bouts of ambulation with RW and w/c follow with CGA overall. Pt ambulated 18,12, and 4f respectively. Pt transported remaining distance back to room and performed ambulatory transfer to recliner CGA. Pt left in recliner at end of session with legs elevated, call bell within reach and needs met.       Therapy Documentation Precautions:  Precautions Precautions: Fall Required Braces or Orthoses: Knee Immobilizer - Left Knee Immobilizer - Left: On at all times Restrictions Weight Bearing Restrictions: Yes LLE Weight Bearing: Non weight bearing General:   Vital Signs: Therapy Vitals Temp: 98.3 F (36.8 C) Pulse Rate: 89 Resp: 18 BP: 123/60 Patient Position (if appropriate): Lying Oxygen Therapy SpO2: 100 % O2 Device: Room Air Pain: Pain Assessment Pain Scale:  0-10 Pain Score: 4  Pain Type: Surgical pain Pain Location: Knee Pain Orientation: Left Pain Descriptors / Indicators: Aching;Throbbing Pain Frequency: Constant Pain Onset: On-going Pain Intervention(s): Medication (See eMAR) Mobility:   Locomotion :    Trunk/Postural Assessment :    Balance:   Exercises:   Other Treatments:      Therapy/Group: Individual Therapy  Laurel Wenzlick 04/07/2021, 4:17 PM

## 2021-04-07 NOTE — Progress Notes (Signed)
Patient ID: Meagan Oneill, female   DOB: 06/17/1990, 30 y.o.   MRN: [id number] Met with the patient to introduce self and review role. Discussed knee surgery, pain management, constipation management along with HTN management. Reviewed recommendation for increased protein and calcium in diet. Discussed pain medications and dietary modifications for pain management. Patient noted prn med made her drowsy. PAC made aware of request for other medication with less sedation effects. Also given information on exercise and the heart, skin care and Xarelto. Continue to follow along to discharge to address educational needs. Collaborate with the team to facilitate preparation for discharge. Margarito Liner

## 2021-04-07 NOTE — Progress Notes (Signed)
Patient noted laying in bed earlier in the shift, alert & responsive. No c/o pain or discomfort. Medications given as ordered & tolerated well. She was assisted into a gown at the time of assessment. IV dressing was changed as ordered. Will request for a discontinuation in the morning. . Patient expressed concern about her urine. She has a purwick & the urine in the canister was a darker amber color earlier in the shift. She is on antibiotic therapy for UTI & it was started prior to her admission here on 12/27. She stated that she did not think that a culture was done & was concerned by the look of the urine that the antibiotics was not working. She stated that she did not feel any urgency or pain when she urinated. We did discuss how antibiotics are usually chosen & but she was encouraged to discuss this with the provider in the morning on rounds. Will continue to monitor for changes in condition.

## 2021-04-07 NOTE — Progress Notes (Signed)
Occupational Therapy Session Note  Patient Details  Name: Meagan Oneill MRN: [id number] Date of Birth: 1990/10/02  Today's Date: 04/07/2021 OT Individual Time: 0935-1030 OT Individual Time Calculation (min): 55 min    Short Term Goals: Week 1:  OT Short Term Goal 1 (Week 1): STGs = LTGs  Skilled Therapeutic Interventions/Progress Updates:    Pt received in recliner dressed and ready for the day. Discussed at length her toileting challenges and use of the toilet aid.  Pt feels it is still very difficult to try to use toilet aid and rotate her torso to reach bottom without shifting to her L and putting pressure on L leg. She has said she that until she can be full WB on LLE she plans to have her mom continue assisting her with cleansing. IF she is alone, pt can (and has already practiced this with other therapists) getting into bed and rolling on her side to more thoroughly cleanse her bottom.. will change toileting goal to min A.   Pt then worked on UE strengthening exercises with level 4 theraband and 4# dowel bar for shoulders biceps and triceps.  Had her work on AROM of BLE with internal rotation to strengthen adductors, pelvic floor.   At end of session pt opted to get back to bed.  SUPERVISION  only with sit to stand to RW, stand pivot to bed and sit to supine without support to her LLE.  Pt resting in bed with all needs met.   Therapy Documentation Precautions:  Precautions Precautions: Fall Required Braces or Orthoses: Knee Immobilizer - Left Knee Immobilizer - Left: On at all times Restrictions Weight Bearing Restrictions: Yes LLE Weight Bearing: Non weight bearing    Vital Signs: Therapy Vitals Temp: (!) 97.5 F (36.4 C) Pulse Rate: 88 Resp: 18 BP: 120/82 Patient Position (if appropriate): Lying Oxygen Therapy SpO2: 95 % O2 Device: Room Air Pain: Pain Assessment Pain Scale: 0-10 Pain Score: 4  Pain Type: Surgical pain Pain Location: Knee Pain Orientation:  Left Pain Descriptors / Indicators: Aching;Throbbing Pain Frequency: Constant Pain Onset: On-going Pain Intervention(s): Medication (See eMAR) ADL: ADL Eating: Independent Grooming: Independent Upper Body Bathing: Setup Lower Body Bathing: Minimal assistance Where Assessed-Lower Body Bathing: Other (Comment) (BSC and bed) Upper Body Dressing: Independent Lower Body Dressing: Minimal assistance (only using dresses and non slip sock on feet) Toileting: Moderate assistance Where Assessed-Toileting: Bedside Commode Toilet Transfer: Contact guard Toilet Transfer Method: Stand pivot Toilet Transfer Equipment: Extra wide bedside commode   Therapy/Group: Individual Therapy  [place] 04/07/2021, 8:28 AM

## 2021-04-07 NOTE — Progress Notes (Signed)
PROGRESS NOTE   Subjective/Complaints: Did fairly well last night. Pain seems controlled  ROS: Patient denies fever, rash, sore throat, blurred vision, nausea, vomiting, diarrhea, cough, shortness of breath or chest pain,  headache, or mood change.    Objective:   VAS [place] LOWER EXTREMITY VENOUS (DVT)  Result Date: 04/06/2021  Lower Venous DVT Study Patient Name:  Meagan Oneill  Date of Exam:   04/06/2021 Medical Rec #: [id number]    Accession #:    [id number] Date of Birth: [birth year]     Patient Gender: F Patient Age:   30 years Exam Location:  [facility] Procedure:      VAS [place] LOWER EXTREMITY VENOUS (DVT) Referring Phys: Lauraine Rinne --------------------------------------------------------------------------------  Indications: Swelling.  Risk Factors: Surgery Trauma. Limitations: Body habitus, poor ultrasound/tissue interface and bandages. Comparison Study: No prior studies. Performing Technologist: Oliver Hum RVT  Examination Guidelines: A complete evaluation includes B-mode imaging, spectral Doppler, color Doppler, and power Doppler as needed of all accessible portions of each vessel. Bilateral testing is considered an integral part of a complete examination. Limited examinations for reoccurring indications may be performed as noted. The reflux portion of the exam is performed with the patient in reverse Trendelenburg.  +---------+---------------+---------+-----------+----------+--------------+  RIGHT     Compressibility Phasicity Spontaneity Properties Thrombus Aging  +---------+---------------+---------+-----------+----------+--------------+  CFV       Full            Yes       Yes                                    +---------+---------------+---------+-----------+----------+--------------+  SFJ       Full                                                              +---------+---------------+---------+-----------+----------+--------------+  FV Prox   Full                                                             +---------+---------------+---------+-----------+----------+--------------+  FV Mid    Full                                                             +---------+---------------+---------+-----------+----------+--------------+  FV Distal Full                                                             +---------+---------------+---------+-----------+----------+--------------+  PFV       Full                                                             +---------+---------------+---------+-----------+----------+--------------+  POP       Full            Yes       Yes                                    +---------+---------------+---------+-----------+----------+--------------+  PTV       Full                                                             +---------+---------------+---------+-----------+----------+--------------+  PERO      Full                                                             +---------+---------------+---------+-----------+----------+--------------+   +---------+---------------+---------+-----------+----------+-------------------+  LEFT      Compressibility Phasicity Spontaneity Properties Thrombus Aging       +---------+---------------+---------+-----------+----------+-------------------+  CFV       Full            Yes       Yes                                         +---------+---------------+---------+-----------+----------+-------------------+  SFJ       Full                                                                  +---------+---------------+---------+-----------+----------+-------------------+  FV Prox   Full                                                                  +---------+---------------+---------+-----------+----------+-------------------+  FV Mid    Full                                                                   +---------+---------------+---------+-----------+----------+-------------------+  FV Distal  Yes       Yes                                         +---------+---------------+---------+-----------+----------+-------------------+  PFV       Full                                                                  +---------+---------------+---------+-----------+----------+-------------------+  POP       Full            Yes       Yes                                         +---------+---------------+---------+-----------+----------+-------------------+  PTV       Full                                                                  +---------+---------------+---------+-----------+----------+-------------------+  PERO                                                       Not well visualized  +---------+---------------+---------+-----------+----------+-------------------+     Summary: RIGHT: - There is no evidence of deep vein thrombosis in the lower extremity. However, portions of this examination were limited- see technologist comments above.  - No cystic structure found in the popliteal fossa.  LEFT: - There is no evidence of deep vein thrombosis in the lower extremity. However, portions of this examination were limited- see technologist comments above.  - No cystic structure found in the popliteal fossa.  *See table(s) above for measurements and observations. Electronically signed by Heath Lark on 04/06/2021 at 3:19:23 PM.    Final    Recent Labs    04/06/21 0639  WBC 9.0  HGB 9.1*  HCT 29.9*  PLT 230   Recent Labs    04/06/21 0639  NA 137  K 3.6  CL 104  CO2 27  GLUCOSE 103*  BUN 9  CREATININE 0.70  CALCIUM 8.9    Intake/Output Summary (Last 24 hours) at 04/07/2021 1035 Last data filed at 04/07/2021 0838 Gross per 24 hour  Intake 720 ml  Output 850 ml  Net -130 ml        Physical Exam: Vital Signs Blood pressure 120/82, pulse 88, temperature (!) 97.5 F (36.4 C),  resp. rate 18, height 5\' 4"  (1.626 m), SpO2 95 %.  Constitutional: No distress . Vital signs reviewed. HEENT: NCAT, EOMI, oral membranes moist Neck: supple Cardiovascular: RRR without murmur. No JVD    Respiratory/Chest: CTA Bilaterally without wheezes or rales. Normal effort    GI/Abdomen: BS +, non-tender, non-distended Ext: no clubbing, cyanosis, or edema Psych: pleasant and  cooperative  Skin: Clean and intact without signs of breakdown Neuro:  Alert and oriented x 3. Normal insight and awareness. Intact Memory. Normal language and speech. Cranial nerve exam unremarkable. UE motor 5/5. LLE limited by pain, able to move ankle without issue. RLE 3/5 prox to  4/5 distally, limited by pain. No sensory findings in RLE Musculoskeletal: wounds dressed, clean left knee, foam dressings. Left calf sl tender with palpation. LLE with generalized swelling--stable in appearance    Assessment/Plan: 1. Functional deficits which require 3+ hours per day of interdisciplinary therapy in a comprehensive inpatient rehab setting. Physiatrist is providing close team supervision and 24 hour management of active medical problems listed below. Physiatrist and rehab team continue to assess barriers to discharge/monitor patient progress toward functional and medical goals  Care Tool:  Bathing    Body parts bathed by patient: Right arm, Left arm, Chest, Abdomen, Front perineal area, Right upper leg, Left upper leg, Right lower leg, Face   Body parts bathed by helper: Buttocks Body parts n/a: Left lower leg   Bathing assist Assist Level: Minimal Assistance - Patient > 75%     Upper Body Dressing/Undressing Upper body dressing   What is the patient wearing?: Hospital gown only    Upper body assist Assist Level: Minimal Assistance - Patient > 75%    Lower Body Dressing/Undressing Lower body dressing    Lower body dressing activity did not occur: N/A (pt wears dresses and no underwear)       Lower  body assist Assist for lower body dressing: Set up assist     Toileting Toileting    Toileting assist Assist for toileting: Total Assistance - Patient < 25%     Transfers Chair/bed transfer  Transfers assist     Chair/bed transfer assist level: Minimal Assistance - Patient > 75%     Locomotion Ambulation   Ambulation assist      Assist level: Minimal Assistance - Patient > 75% Assistive device: Walker-rolling Max distance: 21ft   Walk 10 feet activity   Assist     Assist level: Minimal Assistance - Patient > 75% Assistive device: Walker-rolling   Walk 50 feet activity   Assist Walk 50 feet with 2 turns activity did not occur: Safety/medical concerns (fatigue, weakness, LLE NWB precautions, body habitus)         Walk 150 feet activity   Assist Walk 150 feet activity did not occur: Safety/medical concerns (fatigue, weakness, LLE NWB precautions, body habitus)         Walk 10 feet on uneven surface  activity   Assist Walk 10 feet on uneven surfaces activity did not occur: Safety/medical concerns (fatigue, weakness, LLE NWB precautions, body habitus)         Wheelchair     Assist Is the patient using a wheelchair?: Yes Type of Wheelchair: Manual    Wheelchair assist level: Supervision/Verbal cueing Max wheelchair distance: 17ft    Wheelchair 50 feet with 2 turns activity    Assist        Assist Level: Dependent - Patient 0%   Wheelchair 150 feet activity     Assist      Assist Level: Dependent - Patient 0%   Blood pressure 120/82, pulse 88, temperature (!) 97.5 F (36.4 C), resp. rate 18, height 5\' 4"  (1.626 m), SpO2 95 %.  Medical Problem List and Plan: 1.  Debility functional deficits secondary to fall resulting in left knee dislocation/posterior and anterior cruciate ligament tear/lateral meniscal root  avulsion/posterior lateral corner injury involving the popliteal fibular ligament and lateral collateral  ligament/peroneal nerve scarring.S/P arthroscopic PCL ACL reconstruction/lateral posterior horn medial meniscal root repair/posterior lateral corner reconstruction popliteal fibular ligament and lateral collateral ligament reconstruction with peroneal nerve neurolysis 03/25/2021.   -Nonweightbearing with knee immobilizer at all times             -patient may  shower- cover knee on L and use KI             -ELOS/Goals: 7-10 days supervision to mod I- mother cannot help physically  -Continue CIR therapies including PT, OT   2.  Antithrombotics: -DVT/anticoagulation:  Pharmaceutical: Xarelto  -dopplers  negative             -antiplatelet therapy: N/A 3. Pain Management: Celebrex 200 mg twice daily, Robaxin as needed/oxycodone as needed- pt only using tylenol and robaxin currently.  4. Mood: Vita motional support             -antipsychotic agents: N/A 5. Neuropsych: This patient is capable of making decisions on her own behalf. 6. Skin/Wound Care: Routine skin checks 7. Fluids/Electrolytes/Nutrition: encourage po   -added protein supps for low albumin 8.  Acute blood loss anemia.  hgb sl down to 9.1 no gross blood loss  -recheck tomorrow  9.  Hypertension.  Lopressor 25 mg twice daily.  Monitor with increased mobility 10.  Suspected UTI.  Empiric Keflex   -no culture ordered - dysuria better with keflex on board.---continue 5 days empirically 11.  Morbid obesity.  BMI 67.17.  Dietary follow-up    LOS: 2 days A FACE TO FACE EVALUATION WAS PERFORMED  Meredith Staggers 04/07/2021, 10:35 AM

## 2021-04-08 LAB — CBC
HCT: 29.6 % — ABNORMAL LOW (ref 36.0–46.0)
Hemoglobin: 9.1 g/dL — ABNORMAL LOW (ref 12.0–15.0)
MCH: 20.5 pg — ABNORMAL LOW (ref 26.0–34.0)
MCHC: 30.7 g/dL (ref 30.0–36.0)
MCV: 66.7 fL — ABNORMAL LOW (ref 80.0–100.0)
Platelets: 220 10*3/uL (ref 150–400)
RBC: 4.44 MIL/uL (ref 3.87–5.11)
RDW: 20.3 % — ABNORMAL HIGH (ref 11.5–15.5)
WBC: 8.4 10*3/uL (ref 4.0–10.5)
nRBC: 0 % (ref 0.0–0.2)

## 2021-04-08 MED ORDER — MELATONIN 3 MG PO TABS
3.0000 mg | ORAL_TABLET | Freq: Every day | ORAL | Status: DC
  Administered 2021-04-08 – 2021-04-10 (×3): 3 mg via ORAL
  Filled 2021-04-08 (×3): qty 1

## 2021-04-08 NOTE — Progress Notes (Signed)
Physical Therapy Session Note ° °Patient Details  °Name: Meagan Oneill °MRN: [id number] °Date of Birth: 01/03/1991 ° °Today's Date: 04/08/2021 °PT Individual Time: 0900-1005 °PT Individual Time Calculation (min): 65 min  ° °Short Term Goals: °Week 1:  PT Short Term Goal 1 (Week 1): STG=LTG due to LOS ° °Skilled Therapeutic Interventions/Progress Updates: Pt presented in bed sleeping but easily aroused and agreeable to therapy. Pt states minimal pain at this time. Pt indicating need for BSC for BM. PTA donned KI total A for time management and pt performed supine to sit with supervision and use of bed features. Performed stand pivot transfer to BSC with CGA and RW, pt requiring increased effort at this time to perform Sit to stand (+BM). Pt then trasferred back to EOB and transferred to sidelying with use of bed rails to perform peri-care. Per pt this is they way that OT has figured out how she will do it at home. Pt was able to compete peri-care with set up only. PTA and pt then discussed at length, follow up, and what possible services would be needed. Pt verbalized understanding of not receiving HHPT upon d/c and progress to OPPT once able to bear weight. Advised will set up HEP prior to d/c. Per pt states that surgeon said would start ROM next (meaning this) week. Advised currently orders are for KI at ALL times but will contact ortho office for protocol. Pt then agreeable to ambulation in room. Pt ambulated approx 15ft x 2 first bout with sock on and second bout with shoe on. Pt states although harder the shoe did help with force of landing. Pt then requesting to return to bed at end of session, perfomed with supervision and left with KI on, call bell within reach and needs met.  °   ° °Therapy Documentation °Precautions:  °Precautions °Precautions: Fall °Required Braces or Orthoses: Knee Immobilizer - Left °Knee Immobilizer - Left: On at all times °Restrictions °Weight Bearing Restrictions: Yes °LLE Weight Bearing:  Non weight bearing °General: °  °Vital Signs: °Therapy Vitals °Temp: 98 °F (36.7 °C) °Temp Source: Oral °Pulse Rate: 78 °Resp: 16 °BP: 123/71 °Patient Position (if appropriate): Lying °Oxygen Therapy °SpO2: 98 % °O2 Device: Room Air °Pain: °Pain Assessment °Pain Scale: 0-10 °Faces Pain Scale: Hurts little more °Pain Type: Surgical pain °Pain Location: Knee °Pain Orientation: Left °Pain Descriptors / Indicators: Aching °Mobility: °  °Locomotion : °   °Trunk/Postural Assessment : °Postural Control °Postural Control: Within Functional Limits  °Balance: °Static Sitting Balance °Static Sitting - Level of Assistance: 7: Independent °Dynamic Sitting Balance °Dynamic Sitting - Level of Assistance: 7: Independent °Static Standing Balance °Static Standing - Level of Assistance: 6: Modified independent (Device/Increase time) °Dynamic Standing Balance °Dynamic Standing - Level of Assistance: 6: Modified independent (Device/Increase time) °Exercises: °  °Other Treatments:   ° ° ° °Therapy/Group: Individual Therapy ° °Rosita DeChalus °04/08/2021, 4:07 PM  °

## 2021-04-08 NOTE — Discharge Summary (Incomplete)
Physician Discharge Summary  Patient ID: Meagan Oneill MRN: [id number] DOB/AGE: 31-Jun-1992 31 y.o.  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 FINDINGS: Fluoroscopic guidance was provided for left knee surgery. Fluoroscopic time 17.7 seconds. Fluoroscopic dose 2.19 mGy. IMPRESSION: Fluoroscopic assistance was provided for left knee surgery. Electronically Signed   By: Ernie Avena M.D.   On: 03/25/2021 12:58   DG C-Arm 1-60 Min-No Report  Result Date: 03/25/2021 Fluoroscopy was utilized by the requesting physician.  No radiographic interpretation.   DG C-Arm 1-60 Min-No Report  Result Date: 03/25/2021 Fluoroscopy was utilized by the requesting physician.  No radiographic interpretation.   DG C-Arm 1-60 Min-No Report  Result Date: 03/25/2021 Fluoroscopy was utilized by the requesting physician.  No radiographic interpretation.   VAS [place] LOWER EXTREMITY VENOUS (DVT)  Result Date: 04/06/2021  Lower Venous DVT Study Patient Name:  Meagan Oneill  Date of Exam:   04/06/2021 Medical Rec #: [id number]    Accession #:    [id number] Date of Birth: 1990-06-27     Patient Gender: F Patient Age:   29 years Exam Location:  [facility] Procedure:      VAS [place] LOWER EXTREMITY VENOUS (DVT) Referring Phys: Mariam Dollar  --------------------------------------------------------------------------------  Indications: Swelling.  Risk Factors: Surgery Trauma. Limitations: Body habitus, poor ultrasound/tissue interface and bandages. Comparison Study: No prior studies. Performing Technologist: Chanda Busing RVT  Examination Guidelines: A complete evaluation includes B-mode imaging, spectral Doppler, color Doppler, and power Doppler as needed of all accessible portions of each vessel. Bilateral testing is considered an integral part of a complete examination. Limited examinations for reoccurring indications may be performed as noted. The reflux portion of the exam is performed with the patient in reverse Trendelenburg.  +---------+---------------+---------+-----------+----------+--------------+  RIGHT     Compressibility Phasicity Spontaneity Properties Thrombus Aging  +---------+---------------+---------+-----------+----------+--------------+  CFV       Full            Yes       Yes                                    +---------+---------------+---------+-----------+----------+--------------+  SFJ       Full                                                             +---------+---------------+---------+-----------+----------+--------------+  FV Prox   Full                                                             +---------+---------------+---------+-----------+----------+--------------+  FV Mid    Full                                                             +---------+---------------+---------+-----------+----------+--------------+  FV Distal Full                                                             +---------+---------------+---------+-----------+----------+--------------+  PFV       Full                                                             +---------+---------------+---------+-----------+----------+--------------+  POP       Full            Yes       Yes                                     +---------+---------------+---------+-----------+----------+--------------+  PTV       Full                                                             +---------+---------------+---------+-----------+----------+--------------+  PERO      Full                                                             +---------+---------------+---------+-----------+----------+--------------+   +---------+---------------+---------+-----------+----------+-------------------+  LEFT      Compressibility Phasicity Spontaneity Properties Thrombus Aging       +---------+---------------+---------+-----------+----------+-------------------+  CFV       Full            Yes       Yes                                         +---------+---------------+---------+-----------+----------+-------------------+  SFJ       Full                                                                  +---------+---------------+---------+-----------+----------+-------------------+  FV Prox   Full                                                                  +---------+---------------+---------+-----------+----------+-------------------+  FV Mid    Full                                                                  +---------+---------------+---------+-----------+----------+-------------------+  FV Distal                 Yes       Yes                                         +---------+---------------+---------+-----------+----------+-------------------+  PFV       Full                                                                  +---------+---------------+---------+-----------+----------+-------------------+  POP       Full            Yes       Yes                                         +---------+---------------+---------+-----------+----------+-------------------+  PTV       Full                                                                  +---------+---------------+---------+-----------+----------+-------------------+  PERO                                                        Not well visualized  +---------+---------------+---------+-----------+----------+-------------------+     Summary: RIGHT: - There is no evidence of deep vein thrombosis in the lower extremity. However, portions of this examination were limited- see technologist comments above.  - No cystic structure found in the popliteal fossa.  LEFT: - There is no evidence of deep vein thrombosis in the lower extremity. However, portions of this examination were limited- see technologist comments above.  - No cystic structure found in the popliteal fossa.  *See table(s) above for measurements and observations. Electronically signed by Jamelle Haring on 04/06/2021 at 3:19:23 PM.    Final     Labs:  Basic Metabolic Panel: Recent Labs  Lab 04/06/21 0639  NA 137  K 3.6  CL 104  CO2 27  GLUCOSE 103*  BUN 9  CREATININE 0.70  CALCIUM 8.9    CBC: Recent Labs  Lab 04/06/21 0639 04/08/21 0549  WBC 9.0 8.4  NEUTROABS 5.3  --   HGB 9.1* 9.1*  HCT 29.9* 29.6*  MCV 66.9* 66.7*  PLT 230 220    CBG:  No results for input(s): GLUCAP in the last 168 hours.  Brief HPI:   Meagan Oneill is a 30 y.o. female who sustained a low-energy knee dislocation while at work approximately 6 weeks prior to presentation.  She presented to the emergency department on 03/25/2021 with persistent and increasing knee pain.  She underwent arthroscopic PCL reconstruction/arthroscopic ACL reconstruction.  Knee immobilizer was placed for nonweightbearing at all times of her left lower extremity.  She was maintained on Xarelto for DVT prophylaxis.  Acute blood loss anemia with a hemoglobin to 9.6.  This was monitored.  She did not require transfusion.  She developed dysuria and bacterial found on urinalysis.  She was placed on Keflex.   Hospital Course: Meagan Oneill was admitted to rehab 04/05/2021 for inpatient therapies to consist of PT, ST and OT at least three hours five days a week. Past admission  physiatrist, therapy team and rehab RN have worked together to provide customized collaborative inpatient rehab.  Blood pressures were monitored on TID basis and was well controlled on Lopressor 25 mg twice daily.  Rehab course: During patient's stay in rehab weekly team conferences were held to monitor patient's progress, set goals and discuss barriers to discharge. At admission, patient required min with mobility secondary to muscle weakness and muscle joint tightness, decreased cardiorespiratory endurance, decreased standing balance, decreased postural control, decreased balance strategies and difficulty maintaining precautions.  She  has had improvement in activity tolerance, balance, postural control as well as ability to compensate for deficits. She has had improvement in functional use of LLE as well as improvement in awareness.  She moved from supine to sit on edge of bed and stand pivot transfer to bedside commode with supervision.  Toileting with supervision.  Patient transferred to wheelchair and transition to ADL.  All task at supervision level with no LOB or safety concerns.  Patient perform sit to stand and hop pivot transfer to wheelchair with CGA.  Performed ambulatory transfer to high low mat with CGA and rolling walker and participate in standing tolerance activity.   Disposition:  There are no questions and answers to display.         Diet: Heart healthy  Special Instructions:  No driving, alcohol consumption or tobacco use.    30-35 minutes were spent on discharge planning and discharge summary.   Allergies as of 04/08/2021       Reactions   Morphine And Related Hives   04/07/2021- Patient is currently tolerating tramadol and oxycodone without reaction.      Med Rec must be completed prior to using this [facility]***       Follow-up Information     Meredith Staggers, MD Follow up.   Specialty: Physical Medicine and Rehabilitation Why: No formal follow-up  needed Contact information: [address] [place] [address] [phone]         Meredith Pel, MD Follow up.   Specialty: Orthopedic Surgery Why: Call for appointment Contact information: [address] [place] [address] [phone]                 Signed: Barbie Banner 04/08/2021, 3:08 PM

## 2021-04-08 NOTE — Progress Notes (Signed)
Occupational Therapy Session Note  Patient Details  Name: [place] Pollack MRN: [id number] Date of Birth: 1990-10-27  Today's Date: 04/08/2021 OT Individual Time: 1300-1355 OT Individual Time Calculation (min): 55 min    Short Term Goals: Week 1:  OT Short Term Goal 1 (Week 1): STGs = LTGs  Skilled Therapeutic Interventions/Progress Updates:    Pt resting in bed upon arrival and agreeable to getting OOB for therapy. Pt requested to use BSC. Pt donned KI independently. Supine>sit EOB and stand pivot transfer to [facility] with supervision. Toileting with supervision. Pt transferred to w/c and transitioned to ADL apt/kitchen. Focus on accessing refrigerator, stove, and cabinets at w/c level. Pt used reacher appropriately. Pt practiced bed transfers in ADL apartment. All tasks at supervision level with no LOB or safety concerns. Pt returned to room and tranfserred back to bed. Pt remained in bed with all needs within reach.   Therapy Documentation Precautions:  Precautions Precautions: Fall Required Braces or Orthoses: Knee Immobilizer - Left Knee Immobilizer - Left: On at all times Restrictions Weight Bearing Restrictions: Yes LLE Weight Bearing: Non weight bearing   Pain: Pt denies pain this afternoon   Therapy/Group: Individual Therapy  Rich Brave 04/08/2021, 2:42 PM

## 2021-04-08 NOTE — Progress Notes (Signed)
Occupational Therapy Discharge Summary  Patient Details  Name: Meagan Oneill MRN: [id number] Date of Birth: February 06, 1991   Patient has met 7 of 7 long term goals due to improved activity tolerance, improved balance, and ability to compensate for deficits.  Patient to discharge at overall set up to Modified Independent level.   Patient's care partner is independent to provide the necessary physical assistance at discharge.   The pt will be going home independently to her apartment and will have friends/family to assist PRN. Pt is prepared to use BSC at home for toileting as well as wheelchair and RW for mobility throughout her apartment. Pt plans to use kitchen sink for sponge bathing. No formal family education with family/friends for ADLs as pt is competent and able to understand education on her own.  Pt education completed.   HEP provided for UE strength with therabands.  Reasons goals not met: n/a  Recommendation:  No further OT recommended at this time.  Equipment: Toilet aid, long sponge, reacher, walker bag- pt already has a BSC   Reasons for discharge: treatment goals met  Patient/family agrees with progress made and goals achieved: Yes  OT Discharge Precautions/Restrictions  Precautions Precautions: Fall Required Braces or Orthoses: Knee Immobilizer - Left Knee Immobilizer - Left: On at all times Restrictions LLE Weight Bearing: Non weight bearing  ADL ADL Eating: Independent Grooming: Independent Upper Body Bathing: Setup Where Assessed-Upper Body Bathing: Other (Comment) (BSC) Lower Body Bathing: Setup Where Assessed-Lower Body Bathing: Other (Comment) (BSC) Upper Body Dressing: Independent Lower Body Dressing: Setup Where Assessed-Lower Body Dressing: Bed level Toileting: Minimal assistance Where Assessed-Toileting: Bedside Commode Toilet Transfer: Close supervision Toilet Transfer Method: Stand pivot Science writer: Extra wide bedside  commode Vision Baseline Vision/History: 0 No visual deficits Patient Visual Report: No change from baseline Vision Assessment?: No apparent visual deficits Perception  Perception: Within Functional Limits Praxis Praxis: Intact Cognition Overall Cognitive Status: Within Functional Limits for tasks assessed Orientation Level: Oriented X4 Year: 2022 Month: December Day of Week: Correct Memory: Appears intact Immediate Memory Recall: Sock;Bed;Blue Memory Recall Sock: Without Cue Memory Recall Blue: Without Cue Memory Recall Bed: Without Cue Awareness: Appears intact Problem Solving: Appears intact Safety/Judgment: Appears intact Sensation Sensation Light Touch: Appears Intact Hot/Cold: Appears Intact Proprioception: Appears Intact Stereognosis: Appears Intact Coordination Gross Motor Movements are Fluid and Coordinated: Yes Fine Motor Movements are Fluid and Coordinated: Yes Finger Nose Finger Test: [facility] bilaterally Motor  Motor Motor: Within Functional Limits Mobility    Mod I for transfers with RW Trunk/Postural Assessment  Postural Control Postural Control: Within Functional Limits  Balance Static Sitting Balance Static Sitting - Level of Assistance: 7: Independent Dynamic Sitting Balance Dynamic Sitting - Level of Assistance: 7: Independent Static Standing Balance Static Standing - Level of Assistance: 6: Modified independent (Device/Increase time) Dynamic Standing Balance Dynamic Standing - Level of Assistance: 6: Modified independent (Device/Increase time) Extremity/Trunk Assessment RUE Assessment RUE Assessment: Within Functional Limits LUE Assessment LUE Assessment: Within Functional Limits   Jontavious Commons 04/11/2021, 10:34 AM

## 2021-04-08 NOTE — Progress Notes (Signed)
PROGRESS NOTE   Subjective/Complaints: Did fairly well last night. Pain seems controlled  ROS: Patient denies fever, rash, sore throat, blurred vision, nausea, vomiting, diarrhea, cough, shortness of breath or chest pain,  headache, or mood change.    Objective:   VAS [place] LOWER EXTREMITY VENOUS (DVT)  Result Date: 04/06/2021  Lower Venous DVT Study Patient Name:  Meagan Oneill  Date of Exam:   04/06/2021 Medical Rec #: [id number]    Accession #:    [id number] Date of Birth: [birth year] years     Patient Gender: F Patient Age:   30 years Exam Location:  [facility] Procedure:      VAS [place] LOWER EXTREMITY VENOUS (DVT) Referring Phys: Lauraine Rinne --------------------------------------------------------------------------------  Indications: Swelling.  Risk Factors: Surgery Trauma. Limitations: Body habitus, poor ultrasound/tissue interface and bandages. Comparison Study: No prior studies. Performing Technologist: Oliver Hum RVT  Examination Guidelines: A complete evaluation includes B-mode imaging, spectral Doppler, color Doppler, and power Doppler as needed of all accessible portions of each vessel. Bilateral testing is considered an integral part of a complete examination. Limited examinations for reoccurring indications may be performed as noted. The reflux portion of the exam is performed with the patient in reverse Trendelenburg.  +---------+---------------+---------+-----------+----------+--------------+  RIGHT     Compressibility Phasicity Spontaneity Properties Thrombus Aging  +---------+---------------+---------+-----------+----------+--------------+  CFV       Full            Yes       Yes                                    +---------+---------------+---------+-----------+----------+--------------+  SFJ       Full                                                              +---------+---------------+---------+-----------+----------+--------------+  FV Prox   Full                                                             +---------+---------------+---------+-----------+----------+--------------+  FV Mid    Full                                                             +---------+---------------+---------+-----------+----------+--------------+  FV Distal Full                                                             +---------+---------------+---------+-----------+----------+--------------+  PFV       Full                                                             +---------+---------------+---------+-----------+----------+--------------+  POP       Full            Yes       Yes                                    +---------+---------------+---------+-----------+----------+--------------+  PTV       Full                                                             +---------+---------------+---------+-----------+----------+--------------+  PERO      Full                                                             +---------+---------------+---------+-----------+----------+--------------+   +---------+---------------+---------+-----------+----------+-------------------+  LEFT      Compressibility Phasicity Spontaneity Properties Thrombus Aging       +---------+---------------+---------+-----------+----------+-------------------+  CFV       Full            Yes       Yes                                         +---------+---------------+---------+-----------+----------+-------------------+  SFJ       Full                                                                  +---------+---------------+---------+-----------+----------+-------------------+  FV Prox   Full                                                                  +---------+---------------+---------+-----------+----------+-------------------+  FV Mid    Full                                                                   +---------+---------------+---------+-----------+----------+-------------------+  FV Distal  Yes       Yes                                         +---------+---------------+---------+-----------+----------+-------------------+  PFV       Full                                                                  +---------+---------------+---------+-----------+----------+-------------------+  POP       Full            Yes       Yes                                         +---------+---------------+---------+-----------+----------+-------------------+  PTV       Full                                                                  +---------+---------------+---------+-----------+----------+-------------------+  PERO                                                       Not well visualized  +---------+---------------+---------+-----------+----------+-------------------+     Summary: RIGHT: - There is no evidence of deep vein thrombosis in the lower extremity. However, portions of this examination were limited- see technologist comments above.  - No cystic structure found in the popliteal fossa.  LEFT: - There is no evidence of deep vein thrombosis in the lower extremity. However, portions of this examination were limited- see technologist comments above.  - No cystic structure found in the popliteal fossa.  *See table(s) above for measurements and observations. Electronically signed by Heath Larkhomas Hawken on 04/06/2021 at 3:19:23 PM.    Final    Recent Labs    04/06/21 0639 04/08/21 0549  WBC 9.0 8.4  HGB 9.1* 9.1*  HCT 29.9* 29.6*  PLT 230 220   Recent Labs    04/06/21 0639  NA 137  K 3.6  CL 104  CO2 27  GLUCOSE 103*  BUN 9  CREATININE 0.70  CALCIUM 8.9    Intake/Output Summary (Last 24 hours) at 04/08/2021 0954 Last data filed at 04/08/2021 0700 Gross per 24 hour  Intake 600 ml  Output --  Net 600 ml        Physical Exam: Vital Signs Blood pressure 119/79, pulse 78,  temperature 97.7 F (36.5 C), temperature source Oral, resp. rate 18, height 5\' 4"  (1.626 m), SpO2 100 %.  Constitutional: No distress . Vital signs reviewed. HEENT: NCAT, EOMI, oral membranes moist Neck: supple Cardiovascular: RRR without murmur. No JVD    Respiratory/Chest: CTA Bilaterally without wheezes or rales. Normal effort    GI/Abdomen: BS +, non-tender, non-distended Ext: no  clubbing, cyanosis, or edema Psych: pleasant and cooperative  Skin: Clean and intact without signs of breakdown Neuro:  Alert and oriented x 3. Normal insight and awareness. Intact Memory. Normal language and speech. Cranial nerve exam unremarkable. UE motor 5/5. LLE limited by pain, able to move ankle without issue. RLE 3/5 prox to  4/5 distally, limited by pain. No sensory findings in RLE Musculoskeletal: wounds dressed, clean left knee, foam dressings. Left calf sl tender with palpation. LLE with generalized swelling--stable in appearance    Assessment/Plan: 1. Functional deficits which require 3+ hours per day of interdisciplinary therapy in a comprehensive inpatient rehab setting. Physiatrist is providing close team supervision and 24 hour management of active medical problems listed below. Physiatrist and rehab team continue to assess barriers to discharge/monitor patient progress toward functional and medical goals  Care Tool:  Bathing    Body parts bathed by patient: Right arm, Left arm, Chest, Abdomen, Front perineal area, Right upper leg, Left upper leg, Right lower leg, Face   Body parts bathed by helper: Buttocks Body parts n/a: Left lower leg   Bathing assist Assist Level: Minimal Assistance - Patient > 75%     Upper Body Dressing/Undressing Upper body dressing   What is the patient wearing?: Hospital gown only    Upper body assist Assist Level: Minimal Assistance - Patient > 75%    Lower Body Dressing/Undressing Lower body dressing    Lower body dressing activity did not occur:  N/A (pt wears dresses and no underwear) What is the patient wearing?: Hospital gown only     Lower body assist Assist for lower body dressing: Set up assist     Toileting Toileting    Toileting assist Assist for toileting: Total Assistance - Patient < 25%     Transfers Chair/bed transfer  Transfers assist     Chair/bed transfer assist level: Minimal Assistance - Patient > 75%     Locomotion Ambulation   Ambulation assist      Assist level: Minimal Assistance - Patient > 75% Assistive device: Walker-rolling Max distance: 55ft   Walk 10 feet activity   Assist     Assist level: Minimal Assistance - Patient > 75% Assistive device: Walker-rolling   Walk 50 feet activity   Assist Walk 50 feet with 2 turns activity did not occur: Safety/medical concerns (fatigue, weakness, LLE NWB precautions, body habitus)         Walk 150 feet activity   Assist Walk 150 feet activity did not occur: Safety/medical concerns (fatigue, weakness, LLE NWB precautions, body habitus)         Walk 10 feet on uneven surface  activity   Assist Walk 10 feet on uneven surfaces activity did not occur: Safety/medical concerns (fatigue, weakness, LLE NWB precautions, body habitus)         Wheelchair     Assist Is the patient using a wheelchair?: Yes Type of Wheelchair: Manual    Wheelchair assist level: Supervision/Verbal cueing Max wheelchair distance: 72ft    Wheelchair 50 feet with 2 turns activity    Assist        Assist Level: Dependent - Patient 0%   Wheelchair 150 feet activity     Assist      Assist Level: Dependent - Patient 0%   Blood pressure 119/79, pulse 78, temperature 97.7 F (36.5 C), temperature source Oral, resp. rate 18, height 5\' 4"  (1.626 m), SpO2 100 %.  Medical Problem List and Plan: 1.  Debility functional deficits  secondary to fall resulting in left knee dislocation/posterior and anterior cruciate ligament tear/lateral  meniscal root avulsion/posterior lateral corner injury involving the popliteal fibular ligament and lateral collateral ligament/peroneal nerve scarring.S/P arthroscopic PCL ACL reconstruction/lateral posterior horn medial meniscal root repair/posterior lateral corner reconstruction popliteal fibular ligament and lateral collateral ligament reconstruction with peroneal nerve neurolysis 03/25/2021.   -Nonweightbearing with knee immobilizer at all times             -patient may  shower- cover knee on L and use KI             -ELOS/Goals: might need just a few more days. Mod I goals  -Continue CIR therapies including PT, OT 2.  Antithrombotics: -DVT/anticoagulation:  Pharmaceutical: Xarelto  -dopplers  negative             -antiplatelet therapy: N/A 3. Pain Management: Celebrex 200 mg twice daily, Robaxin as needed/oxycodone as needed- pt only using tylenol and robaxin currently.   -pain controlled 4. Mood: Vita motional support             -antipsychotic agents: N/A 5. Neuropsych: This patient is capable of making decisions on her own behalf. 6. Skin/Wound Care: incisions all clean, intact and dry 7. Fluids/Electrolytes/Nutrition: encourage po   -added protein supps for low albumin 8.  Acute blood loss anemia.    no gross blood loss  -12/30 hgb holding at 9.1 9.  Hypertension.  Lopressor 25 mg twice daily.  Monitor with increased mobility 10.  Suspected UTI.  Empiric Keflex   -no culture ordered - keflex on board.---last dose today -12/30 pt still reporting discoloration of urine, ?mild dysuria  -will order urine culture 11.  Morbid obesity.  BMI 67.17.  Dietary follow-up    LOS: 3 days A FACE TO FACE EVALUATION WAS PERFORMED  Meredith Staggers 04/08/2021, 9:54 AM

## 2021-04-08 NOTE — Patient Care Conference (Signed)
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 Faith Rogue Social Worker Present: Dossie Der, LCSW Nurse Present: Chana Bode, RN PT Present: Wynelle Link, PT;Rosita Dechalus, PTA OT Present: Primitivo Gauze, OT     Current Status/Progress Goal Weekly Team Focus  Bowel/Bladder     Continent   Continent   Toileting  Swallow/Nutrition/ Hydration             ADL's   set up to min A  set up to mod I  ADL training, pt education, HEP   Mobility   bed mobility supervision, transfers with RW min A, gait 37ft with RW min A  Mod ID  D/C planning, functional mobility/transfers, generalized strengthening, dynamic standing balance/coordination, gait training, endurance.   Communication             Safety/Cognition/ Behavioral Observations            Pain     Pain managed with prn medications   Pain at or below level 4 with prn meds   Monitor need for and effectiveness of prn meds  Skin     Incision   Incision healing   Monitor skin q shift    Discharge Planning: Discharge home with mom. SW requested new size w/c 24x18 or 24x20 for patient at therapy request. SW ordered new w/c with [employer] and 24" w/c delivered to pt.       Team Discussion: Patient is doing well.  Patient on target to meet rehab goals: yes, currently needs set up - min assist for ADLs. Completes transfers with min assist using a RW and able to ambulate up to 20' with min assist.   *See Care Plan and progress notes for long and short-term goals.   Revisions to Treatment Plan:  N/A  Teaching Needs: Safety, weight bearing restrictions, skin care, medications, transfers, stair management, etc.  Current Barriers to Discharge: Decreased caregiver support and lack of insurance for follow up services  Possible Resolutions to Barriers: Family education DME: SW working with [employer] on W/C HEP provided to the patient     Medical Summary Current Status: left knee dislocation, ligamentous injury s/p repair, acute blood loss anemia  Barriers to Discharge: Medical stability   Possible Resolutions to [employer] Focus: pain mgt, daily assessment of labs and pt data   Continued Need for Acute Rehabilitation Level of Care: The patient requires daily medical management by a physician with specialized training in physical medicine and rehabilitation for the following reasons: Direction of a multidisciplinary physical rehabilitation program to maximize functional independence : Yes Medical management of patient stability for increased activity during participation in an intensive rehabilitation regime.: Yes Analysis of laboratory  values and/or radiology reports with any subsequent need for medication adjustment and/or medical intervention. : Yes   I attest that I was present, lead the team conference, and concur with the assessment and plan of the team.   Chana Bode B 04/08/2021, 2:10 PM

## 2021-04-08 NOTE — Progress Notes (Signed)
Patient ID: Meagan Oneill, female   DOB: Sep 21, 1990, 30 y.o.   MRN: [id number]  This SW covering for primary SW, Becky Dupree.   SW requested new size w/c 24x18 or 24x20 for patient at therapy request. SW ordered new w/c with [employer] and 24" w/c delivered to pt.   Meagan Oneill, MSW, LCSWA Office: [phone] Cell: [phone] Fax: [phone]

## 2021-04-08 NOTE — IPOC Note (Signed)
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   Severe obesity (BMI >= 40) (HCC)     Functional Problem List: Nursing Bowel, Edema, Medication Management, Pain, Motor, Endurance, Safety  PT Balance, Edema, Endurance, Nutrition, Skin Integrity, Pain  OT Balance, Motor, Pain  SLP    TR         Basic ADLs: OT Bathing, Dressing, Toileting     Advanced  ADLs: OT Simple Meal Preparation     Transfers: PT Bed Mobility, Bed to Chair, Car, Oncologist: PT Ambulation, Psychologist, prison and probation services, Stairs     Additional Impairments: OT None  SLP        TR      Anticipated Outcomes Item Anticipated Outcome  Self Feeding no goal, pt is independent  Swallowing      Basic self-care  mod I for bathing and dressing - sponge baths only  Toileting  mod I   Bathroom Transfers mod I to [facility]  Bowel/Bladder  manage bowel w mod I assist  Transfers  Mod I  Locomotion  Mod I  Communication     Cognition     Pain  pain at or below level 4 with prn meds  Safety/Judgment  maintain safety with cues   Therapy Plan: PT Intensity: Minimum of 1-2 x/day ,45 to 90 minutes PT Frequency: 5 out of 7 days PT Duration Estimated Length of Stay: 5-7 days OT Intensity: Minimum of 1-2 x/day, 45 to 90 minutes OT Frequency: 5 out of 7 days OT Duration/Estimated Length of Stay: 5-7 days     Due to the current state of emergency, patients may not be receiving their 3-hours of Medicare-mandated therapy.   Team Interventions: Nursing Interventions Patient/Family Education, Bowel Management,  Disease Management/Prevention, Pain Management, Medication Management, Discharge Planning  PT interventions Ambulation/gait training, Discharge planning, Functional mobility training, Psychosocial support, Balance/vestibular training, Disease management/prevention, Neuromuscular re-education, Skin care/wound management, Therapeutic Exercise, Wheelchair propulsion/positioning, DME/adaptive equipment instruction, Pain management, Splinting/orthotics, UE/LE Strength taining/ROM, Community reintegration, Equities trader education, Museum/gallery curator, UE/LE Coordination activities  OT Interventions Warden/ranger, Discharge planning, DME/adaptive equipment instruction, Functional mobility training, Psychosocial support, Patient/family education, Self Care/advanced ADL retraining, Therapeutic Exercise, Therapeutic Activities, Pain management  SLP Interventions    TR Interventions    SW/CM Interventions Discharge Planning, Psychosocial Support, Patient/Family Education   Barriers to Discharge MD  Medical stability  Nursing Decreased caregiver support, Weight bearing restrictions, Weight 1 level/level 3 entry with mom, supervision only; no physical assist  PT Decreased caregiver support, Home environment access/layout, Wound Care, Weight, Weight bearing restrictions D/C to mom's house but mom will only be able to provide intermittent assist, LLE NWB precautions  OT      SLP      SW Insurance for SNF coverage, Decreased caregiver support, Weight     Team Discharge Planning: Destination: PT-Home ,OT- Home , SLP-  Projected Follow-up: PT-Home health PT, OT-  Home health OT, SLP-  Projected Equipment Needs: PT-To be determined, OT- None recommended by OT, SLP-  Equipment Details: PT-has bedside commode, RW, and crutches, OT-pt already has a  RW and BSC Patient/family involved in discharge planning: PT- Patient,  OT-Patient, SLP-   MD ELOS: 04/11/21 Medical Rehab Prognosis:   Excellent Assessment: The patient has been admitted for CIR therapies with the diagnosis of left knee dislocation, ligamentous injuries. The team will be addressing functional mobility, strength, stamina, balance, safety, adaptive techniques and equipment, self-care, bowel and bladder mgt, patient and caregiver education, pain mgt, orthotic mgt, community reentry. Goals have been set at mod I for basic mobility and self-care.   Due to the current state of emergency, patients may not be receiving their 3 hours per day of Medicare-mandated therapy.    Ranelle Oyster, MD, FAAPMR     See Team Conference Notes for weekly updates to the plan of care

## 2021-04-08 NOTE — Progress Notes (Signed)
Physical Therapy Session Note  Patient Details  Name: Meagan Oneill MRN: [id number] Date of Birth: [birth year]  Today's Date: 04/08/2021 PT Individual Time: 1400-1445 PT Individual Time Calculation (min): 45 min   Short Term Goals: Week 1:  PT Short Term Goal 1 (Week 1): STG=LTG due to LOS  Skilled Therapeutic Interventions/Progress Updates: Pt presents supine in bed, waiting for therapist.  Pt transfers sup to sit w/ mod I and scoots to EOB.  Pt transfers sit to stand w/ supervision and RW.  Pt amb in room to personal w/c w/ RW and CGA to close supervision.  Pt wheeled x 50' w/ B Ues and supervision, but fatigues and PT completes negotiation to dayroom.  Pt performed multiple sit to stand transfers w/ supervision.  Pt reaching forward and crossing midline stacking/unstacking cones maintaining NWB LLE w/ knee immobilizer.  Pt amb x 2 w/ RW x 30' w/ CGA, verbal cues for increased use of UEs for care of RLE.  Pt amb x 10' w/ RW to bed.  Pt transferred sit to supine w/ independence.  KI removed and bed alarm on and all needs in reach.     Therapy Documentation Precautions:  Precautions Precautions: Fall Required Braces or Orthoses: Knee Immobilizer - Left Knee Immobilizer - Left: On at all times Restrictions Weight Bearing Restrictions: Yes LLE Weight Bearing: Non weight bearing General:   Vital Signs:   Pain: Pain Assessment Pain Scale: 0-10 Faces Pain Scale: Hurts little more Pain Type: Surgical pain Pain Location: Knee Pain Orientation: Left Pain Descriptors / Indicators: Aching Mobility:   Locomotion :    Trunk/Postural Assessment : Postural Control Postural Control: Within Functional Limits  Balance: Static Sitting Balance Static Sitting - Level of Assistance: 7: Independent Dynamic Sitting Balance Dynamic Sitting - Level of Assistance: 7: Independent Static Standing Balance Static Standing - Level of Assistance: 6: Modified independent (Device/Increase time) Dynamic  Standing Balance Dynamic Standing - Level of Assistance: 6: Modified independent (Device/Increase time) Exercises:   Other Treatments:      Therapy/Group: Individual Therapy  Lucio Edward 04/08/2021, 2:47 PM

## 2021-04-08 NOTE — Progress Notes (Signed)
Occupational Therapy Session Note  Patient Details  Name: Meagan Oneill MRN: [id number] Date of Birth: 03-23-1991  Today's Date: 04/08/2021 OT Individual Time: 1015-1100 OT Individual Time Calculation (min): 45 min    Short Term Goals: Week 1:  OT Short Term Goal 1 (Week 1): STGs = LTGs  Skilled Therapeutic Interventions/Progress Updates:    Pt received in room sitting on BSC. She had just completed voiding bowel and needed to cleanse. Removed bucket so she could lean forward and reach using toilet aid to cleanse herself partially. Due to body habitus, pt has great difficulty getting herself fully clean in sitting. She stated she prefers to get back to bed to be able to fully reach.  Pt completed UB bathing and dressing sitting on BSC and used long sponge to wash legs.  KI on L leg.  She then used RW with no cues to transfer to bed and moved to supine/sidelying to continue cleansing.  Pt stated she completed task.   Discussed her discharge plans.  While her mother is out of town for a few days, her sister in law and brother will stay with her. Pt feels ready to go home on Monday. Notified team.    Pt then worked with level 4 theraband on UE strengthening exercises. Pt in bed with all needs met.    Therapy Documentation Precautions:  Precautions Precautions: Fall Required Braces or Orthoses: Knee Immobilizer - Left Knee Immobilizer - Left: On at all times Restrictions Weight Bearing Restrictions: Yes LLE Weight Bearing: Non weight bearing    Pain: Pain Assessment Pain Scale: 0-10 Faces Pain Scale: Hurts little more Pain Type: Surgical pain Pain Location: Knee Pain Orientation: Left Pain Descriptors / Indicators: Aching ADL: ADL Eating: Independent Grooming: Independent Upper Body Bathing: Setup Where Assessed-Upper Body Bathing: Other (Comment) (BSC) Lower Body Bathing: Setup Where Assessed-Lower Body Bathing: Other (Comment) (BSC) Upper Body Dressing:  Independent Lower Body Dressing: Setup Where Assessed-Lower Body Dressing: Bed level Toileting: Minimal assistance Where Assessed-Toileting: Bedside Commode Toilet Transfer: Close supervision Toilet Transfer Method: Stand pivot Toilet Transfer Equipment: Extra wide bedside commode  Therapy/Group: Individual Therapy  [place] 04/08/2021, 12:58 PM

## 2021-04-09 LAB — URINE CULTURE: Culture: <10000 — AB

## 2021-04-09 MED ORDER — FLUCONAZOLE 150 MG PO TABS
150.0000 mg | ORAL_TABLET | Freq: Once | ORAL | Status: AC
  Administered 2021-04-09: 150 mg via ORAL
  Filled 2021-04-09: qty 1

## 2021-04-09 NOTE — Progress Notes (Signed)
At [facility] patient complained of feeling anxious and restless. Requesting med for sleep. Paged Dr. Dalene Carrow, melatonin ordered. "Aching" pain to left knee. At 2241, PRN tylenol and melatonin given. Resting comfortably since meds given. Reports Oxy IR makes her groggy. Takes KI off while in bed. Gets up to [facility]. Per report, UC sent on previous shift. Alfredo Martinez A

## 2021-04-09 NOTE — Progress Notes (Signed)
PROGRESS NOTE   Subjective/Complaints: Having vaginal itching. Concerned she may have developed yeast infection after antibiotic use. Asks if culture has returned yet, and it has not yet. Ordered diflucan  ROS: Patient denies fever, rash, sore throat, blurred vision, nausea, vomiting, diarrhea, cough, shortness of breath or chest pain,  headache, or mood change. +vaginal itching   Objective:   No results found. Recent Labs    04/08/21 0549  WBC 8.4  HGB 9.1*  HCT 29.6*  PLT 220   No results for input(s): NA, K, CL, CO2, GLUCOSE, BUN, CREATININE, CALCIUM in the last 72 hours.   Intake/Output Summary (Last 24 hours) at 04/09/2021 1414 Last data filed at 04/09/2021 0415 Gross per 24 hour  Intake 720 ml  Output 450 ml  Net 270 ml        Physical Exam: Vital Signs Blood pressure 110/80, pulse 82, temperature 97.9 F (36.6 C), temperature source Oral, resp. rate 18, height 5\' 4"  (1.626 m), SpO2 100 %. Gen: no distress, normal appearing HEENT: oral mucosa pink and moist, NCAT Cardio: Reg rate Chest: normal effort, normal rate of breathing Abd: soft, non-distended Ext: no edema Psych: pleasant, normal affect Skin: Clean and intact without signs of breakdown Neuro:  Alert and oriented x 3. Normal insight and awareness. Intact Memory. Normal language and speech. Cranial nerve exam unremarkable. UE motor 5/5. LLE limited by pain, able to move ankle without issue. RLE 3/5 prox to  4/5 distally, limited by pain. No sensory findings in RLE Musculoskeletal: wounds dressed, clean left knee, foam dressings. Left calf sl tender with palpation. LLE with generalized swelling--stable in appearance    Assessment/Plan: 1. Functional deficits which require 3+ hours per day of interdisciplinary therapy in a comprehensive inpatient rehab setting. Physiatrist is providing close team supervision and 24 hour management of active medical  problems listed below. Physiatrist and rehab team continue to assess barriers to discharge/monitor patient progress toward functional and medical goals  Care Tool:  Bathing    Body parts bathed by patient: Right arm, Left arm, Chest, Abdomen, Front perineal area, Right upper leg, Left upper leg, Right lower leg, Face, Buttocks   Body parts bathed by helper: Buttocks Body parts n/a: Left lower leg   Bathing assist Assist Level: Set up assist     Upper Body Dressing/Undressing Upper body dressing   What is the patient wearing?: Pull over shirt, Dress    Upper body assist Assist Level: Independent    Lower Body Dressing/Undressing Lower body dressing    Lower body dressing activity did not occur: N/A What is the patient wearing?: Hospital gown only     Lower body assist Assist for lower body dressing: Set up assist     Toileting Toileting    Toileting assist Assist for toileting: Supervision/Verbal cueing (pt completed pericare from lying in bed)     Transfers Chair/bed transfer  Transfers assist     Chair/bed transfer assist level: Supervision/Verbal cueing     Locomotion Ambulation   Ambulation assist      Assist level: Contact Guard/Touching assist Assistive device: Walker-rolling Max distance: 70ft   Walk 10 feet activity   Assist  Assist level: Contact Guard/Touching assist Assistive device: Walker-rolling   Walk 50 feet activity   Assist Walk 50 feet with 2 turns activity did not occur: Safety/medical concerns         Walk 150 feet activity   Assist Walk 150 feet activity did not occur: Safety/medical concerns         Walk 10 feet on uneven surface  activity   Assist Walk 10 feet on uneven surfaces activity did not occur: Safety/medical concerns (fatigue, weakness, LLE NWB precautions, body habitus)         Wheelchair     Assist Is the patient using a wheelchair?: Yes Type of Wheelchair: Manual    Wheelchair  assist level: Supervision/Verbal cueing Max wheelchair distance: 50    Wheelchair 50 feet with 2 turns activity    Assist        Assist Level: Supervision/Verbal cueing   Wheelchair 150 feet activity     Assist      Assist Level: Maximal Assistance - Patient 25 - 49%   Blood pressure 110/80, pulse 82, temperature 97.9 F (36.6 C), temperature source Oral, resp. rate 18, height 5\' 4"  (1.626 m), SpO2 100 %.  Medical Problem List and Plan: 1.  Debility functional deficits secondary to fall resulting in left knee dislocation/posterior and anterior cruciate ligament tear/lateral meniscal root avulsion/posterior lateral corner injury involving the popliteal fibular ligament and lateral collateral ligament/peroneal nerve scarring.S/P arthroscopic PCL ACL reconstruction/lateral posterior horn medial meniscal root repair/posterior lateral corner reconstruction popliteal fibular ligament and lateral collateral ligament reconstruction with peroneal nerve neurolysis 03/25/2021.   -Nonweightbearing with knee immobilizer at all times             -patient may  shower- cover knee on L and use KI             -ELOS/Goals: might need just a few more days. Mod I goals  -Continue CIR therapies including PT, OT 2.  Antithrombotics: -DVT/anticoagulation:  Pharmaceutical: Xarelto  -dopplers  negative             -antiplatelet therapy: N/A 3. Pain: continue Celebrex 200 mg twice daily, Robaxin as needed/oxycodone as needed- pt only using tylenol and robaxin currently.   -pain controlled 4. Mood: Vita motional support             -antipsychotic agents: N/A 5. Neuropsych: This patient is capable of making decisions on her own behalf. 6. Skin/Wound Care: incisions all clean, intact and dry 7. Fluids/Electrolytes/Nutrition: encourage po   -added protein supps for low albumin 8.  Acute blood loss anemia.    no gross blood loss  -12/30 hgb holding at 9.1 9.  Hypertension. Continue Lopressor 25 mg  twice daily.  Monitor with increased mobility 10.  Suspected UTI.  Empiric Keflex   -no culture ordered - keflex on board.---last dose today -12/30 pt still reporting discoloration of urine, ?mild dysuria  -will order urine culture 11.  Morbid obesity.  BMI 67.17.  Dietary follow-up 12. Fungal UTI: diflucan ordered.     LOS: 4 days A FACE TO FACE EVALUATION WAS PERFORMED  Meagan Oneill 04/09/2021, 2:14 PM

## 2021-04-09 NOTE — Progress Notes (Signed)
Occupational Therapy Session Note  Patient Details  Name: Meagan Oneill MRN: [id number] Date of Birth: [birth year]  Today's Date: 04/09/2021 OT Individual Time: 1000-1100 OT Individual Time Calculation (min): 60 min    Short Term Goals: Week 1:  OT Short Term Goal 1 (Week 1): STGs = LTGs  Skilled Therapeutic Interventions/Progress Updates:  Pt greeted supine in bed agreeable to OT intervention. Session focus on BADL reeducation, functional mobility, w/c propulsion, BUE therex and decreasing overall caregiver burden. Pt completd supine>sit from flat HOB independently. Pt required MIN A to don KI. Pt completed stand pivot transfer from [facility] with rw and supervision. Pt completed wash up on BSC with overall set- up asssit of wash cloths. Pt with +BM while on BSC ( indicated in flowsheets), pt completes pericare from supine in bed with supervision. Did provided education on toileting wand that would be a better fit than tongs, showed pt where to purchase if needed at home. Pt completed dressing EOB ndependently only donning dress.       Education and demo provided on BUE HEP with level 3 theraband, pt completed therex as indicated below:  X10 shoulder flexion  X10 bicep curls X10 shoulder horizontal ABD X10 shoulder diagonal pulls X10 shoulder extension  X10 alternating punches X10 bilateral shoulder external rotation  Pt requested to get out of the room and sit near the windows, pt completed w/c propulsion ~ 10 ft and reports difficulty rolling w/c d/t wheels being too slick, applied theraband around rims of wheels to provide some friction. Pt transported back to room with total A where pt left up in w/c with all needs within reach.                  Therapy Documentation Precautions:  Precautions Precautions: Fall Required Braces or Orthoses: Knee Immobilizer - Left Knee Immobilizer - Left: On at all times Restrictions Weight Bearing Restrictions: Yes LLE Weight Bearing: Non weight  bearing  Pain: No pain reported during session    Therapy/Group: Individual Therapy  Barron Schmid 04/09/2021, 12:20 PM

## 2021-04-09 NOTE — Progress Notes (Signed)
Pharmacy Antibiotic Note  Meagan Oneill is a 30 y.o. female admitted on 04/05/2021 with UTI.  Pharmacy has been consulted for fluconazole dosing.  Patient recently completed 5-day course with cephalexin. Patient continues to report urine discoloration. WBC is normal at 8.4, patient remains afebrile. EKG with normal QTc (428 sec).   Plan: Fluconazole 150 mg PO x 1 Monitor for signs of clinical improvement Follow-up urine cultures  Height: 5\' 4"  (162.6 cm) IBW/kg (Calculated) : 54.7  Temp (24hrs), Avg:97.9 F (36.6 C), Min:97.7 F (36.5 C), Max:98 F (36.7 C)  Recent Labs  Lab 04/06/21 0639 04/08/21 0549  WBC 9.0 8.4  CREATININE 0.70  --     Estimated Creatinine Clearance: 163.4 mL/min (by C-G formula based on SCr of 0.7 mg/dL).    Allergies  Allergen Reactions   Morphine And Related Hives    04/07/2021- Patient is currently tolerating tramadol and oxycodone without reaction.     Antimicrobials this admission: 12/26 cephalexin >> 12/30 12/31 fluconazole >>   Dose adjustments this admission: none  Microbiology results: 12/30 UCx: < 10,000 colonies insignificant growth   Thank you for involving pharmacy in this patient's care.  1/31, PharmD PGY1 Ambulatory Care Pharmacy Resident 04/09/2021 12:31 PM  **Pharmacist phone directory can be found on [email or web address] listed under [facility]**

## 2021-04-10 NOTE — Progress Notes (Signed)
PROGRESS NOTE   Subjective/Complaints: She and her mother had an argument and unfortunately her mother will no longer allow her to come to her home, her brother is able to come in for caregiver education today  ROS: Patient denies fever, rash, sore throat, blurred vision, nausea, vomiting, diarrhea, cough, shortness of breath or chest pain,  headache, or mood change. +vaginal itching- improved   Objective:   No results found. Recent Labs    04/08/21 0549  WBC 8.4  HGB 9.1*  HCT 29.6*  PLT 220   No results for input(s): NA, K, CL, CO2, GLUCOSE, BUN, CREATININE, CALCIUM in the last 72 hours.   Intake/Output Summary (Last 24 hours) at 04/10/2021 1012 Last data filed at 04/10/2021 0800 Gross per 24 hour  Intake 789 ml  Output --  Net 789 ml        Physical Exam: Vital Signs Blood pressure 106/69, pulse 82, temperature 97.6 F (36.4 C), temperature source Oral, resp. rate 18, height 5\' 4"  (1.626 m), SpO2 100 %. Gen: no distress, normal appearing HEENT: oral mucosa pink and moist, NCAT Cardio: Reg rate Chest: normal effort, normal rate of breathing Abd: soft, non-distended Ext: no edema Psych: pleasant, normal affect Skin: Clean and intact without signs of breakdown Neuro:  Alert and oriented x 3. Normal insight and awareness. Intact Memory. Normal language and speech. Cranial nerve exam unremarkable. UE motor 5/5. LLE limited by pain, able to move ankle without issue. RLE 3/5 prox to  4/5 distally, limited by pain. No sensory findings in RLE Musculoskeletal: wounds dressed, clean left knee, foam dressings. Left calf sl tender with palpation. LLE with generalized swelling--stable in appearance    Assessment/Plan: 1. Functional deficits which require 3+ hours per day of interdisciplinary therapy in a comprehensive inpatient rehab setting. Physiatrist is providing close team supervision and 24 hour management of active  medical problems listed below. Physiatrist and rehab team continue to assess barriers to discharge/monitor patient progress toward functional and medical goals  Care Tool:  Bathing    Body parts bathed by patient: Right arm, Left arm, Chest, Abdomen, Front perineal area, Right upper leg, Left upper leg, Right lower leg, Face, Buttocks   Body parts bathed by helper: Buttocks Body parts n/a: Left lower leg   Bathing assist Assist Level: Set up assist     Upper Body Dressing/Undressing Upper body dressing   What is the patient wearing?: Pull over shirt, Dress    Upper body assist Assist Level: Independent    Lower Body Dressing/Undressing Lower body dressing    Lower body dressing activity did not occur: N/A What is the patient wearing?: Hospital gown only     Lower body assist Assist for lower body dressing: Set up assist     Toileting Toileting    Toileting assist Assist for toileting: Supervision/Verbal cueing (pt completed pericare from lying in bed)     Transfers Chair/bed transfer  Transfers assist     Chair/bed transfer assist level: Supervision/Verbal cueing     Locomotion Ambulation   Ambulation assist      Assist level: Contact Guard/Touching assist Assistive device: Walker-rolling Max distance: 82ft   Walk 10 feet  activity   Assist     Assist level: Contact Guard/Touching assist Assistive device: Walker-rolling   Walk 50 feet activity   Assist Walk 50 feet with 2 turns activity did not occur: Safety/medical concerns         Walk 150 feet activity   Assist Walk 150 feet activity did not occur: Safety/medical concerns         Walk 10 feet on uneven surface  activity   Assist Walk 10 feet on uneven surfaces activity did not occur: Safety/medical concerns (fatigue, weakness, LLE NWB precautions, body habitus)         Wheelchair     Assist Is the patient using a wheelchair?: Yes Type of Wheelchair: Manual     Wheelchair assist level: Supervision/Verbal cueing Max wheelchair distance: 50    Wheelchair 50 feet with 2 turns activity    Assist        Assist Level: Supervision/Verbal cueing   Wheelchair 150 feet activity     Assist      Assist Level: Maximal Assistance - Patient 25 - 49%   Blood pressure 106/69, pulse 82, temperature 97.6 F (36.4 C), temperature source Oral, resp. rate 18, height 5\' 4"  (1.626 m), SpO2 100 %.  Medical Problem List and Plan: 1.  Debility functional deficits secondary to fall resulting in left knee dislocation/posterior and anterior cruciate ligament tear/lateral meniscal root avulsion/posterior lateral corner injury involving the popliteal fibular ligament and lateral collateral ligament/peroneal nerve scarring.S/P arthroscopic PCL ACL reconstruction/lateral posterior horn medial meniscal root repair/posterior lateral corner reconstruction popliteal fibular ligament and lateral collateral ligament reconstruction with peroneal nerve neurolysis 03/25/2021.   -Nonweightbearing with knee immobilizer at all times             -patient may  shower- cover knee on L and use KI             -ELOS/Goals: might need just a few more days. Mod I goals  -Continue CIR therapies including PT, OT  -please check with ortho if sutures can be removed prior to d/c tomorrow 2.  Antithrombotics: -DVT/anticoagulation:  Pharmaceutical: Xarelto  -dopplers  negative             -antiplatelet therapy: N/A 3. Pain: continue Celebrex 200 mg twice daily, Robaxin as needed/oxycodone as needed- pt only using tylenol and robaxin currently.   -pain controlled 4. Mood: Vita motional support             -antipsychotic agents: N/A 5. Neuropsych: This patient is capable of making decisions on her own behalf. 6. Skin/Wound Care: incisions all clean, intact and dry 7. Fluids/Electrolytes/Nutrition: encourage po   -added protein supps for low albumin 8.  Acute blood loss anemia.    no  gross blood loss  -12/30 hgb holding at 9.1 9.  Hypertension. Continue Lopressor 25 mg twice daily.  Monitor with increased mobility 10.  Suspected UTI.  Empiric Keflex   -no culture ordered initially -completed keflex course UC with <10,000 colonies, insignificant growth, discussed this could be since she was already treated with antibiotics.  11.  Morbid obesity.  BMI 67.17.  Dietary follow-up 12. Fungal UTI: diflucan ordered with benefit, continue    LOS: 5 days A FACE TO FACE EVALUATION WAS PERFORMED  Marilene Vath P Varie Machamer 04/10/2021, 10:12 AM

## 2021-04-10 NOTE — Progress Notes (Signed)
Physical Therapy Session Note  Patient Details  Name: Meagan Oneill MRN: [id number] Date of Birth: 03-24-1991  Today's Date: 04/10/2021 PT Individual Time: 7824-2353 + 1250-1359 PT Individual Time Calculation (min): 55 min  + 69 min  Short Term Goals: Week 1:  PT Short Term Goal 1 (Week 1): STG=LTG due to LOS  Skilled Therapeutic Interventions/Progress Updates:     1st session: Pt sleeping on arrival - awakens to voice but requires + time to fully wake. Agreeable to PT tx and denies resting LLE pain.   Pt reports that she had an argument with her mom last night and DC plans have changed - she's no longer going home with her mom but plan is to DC home alone with intermittent assist from family members (brother/sister). Pt reports 3 STE home with 1 rail. She reports she has not practiced stairs in rehab due to prior plans of going to mother's house with no steps. Reached out to treatment team (MD, RN, primary PT/OT, and CSW) regarding change of DC plan and concern for returning home with stairs being primary barrier. Asked pt to call family (brother/sister) to see if they can come in for family training this afternoon from 1-2 during PT to attempt bumping up/down stairs in w/c.   Pt donned LLE KI, hospital sock, and t-shirt with setupA. Bed mobility completed mod I. Sit<>stand mod I to RW with compliance of NWB status on LLE. Stand<>Pivot transfer mod I to w/c with RW. Pt propelled herself ~6ft mod I in w/c prior to fatigue. Transported remaining distance to main rehab gym.   Demonstrated stair techniques using 1 rail via lateral hopping. Unable to demonstrate shower chair method due to body habitus and weight limit of 250# (pt is 380lb). Pt reporting fear of falling and unable to attempt. Provided video demonstration of w/c bumping up/down steps and pt again expressing fear of falling, reporting "it's going to take 2 strong people" to do that.   Pt instructed in gait training, able to hop ~61ft mod  I with RW while compliant with NWB restrictions on LLE. Poor L foot clearance while hopping with limited vertical clearance, anticipate inability to navigate steps while hopping.  Pt returned to her room in w/c and completed ambulatory transfer mod I with RW to her bed. Bed mobility completed mod I and pt remained long sitting in bed with all needs in reach.   2nd session: Pt supine in bed to start. Agreeable to PT tx without reports of pain. Waiting on brother to arrive for family education/training. While waiting, we discussed home safety, fall prevention, and problem solved home entrance and car transfers. Suggested that she look into alternate method of entrance with paved pathways from other apartment buildings that lead to hers. Pt reports they could just push her in the grass in the w/c but warned her of getting stuck due to soft compliant surface.   When brother arrived, pt donned LLE KI with setupA. Bed mobility mod I. Bed<>chair transfer mod I with RW. Transported in w/c for time outside near [facility] building to practice stair training. Removed anti-tippers and instructed brother in technique. With PT providing +2 assist (positioned in front), pt was bumped down x4 + x4, 6inch steps at dependant level in the w/c. Pt's brother demonstrated better body mechanics during the 2nd set of stairs with PT instruction. Brother reports they have another family member who is strong to help with this transfer at home. Brother able to provide teach-back method  to ensure understanding of transfer. Both patient and brother felt like they could manage the stairs.   Returned upstairs to 31M rehab gym to trouble shoot car transfer. Pt reports she plans to use trunk for riding home due to inability to flex her LLE to get inside car door. Simulated this by using high/low mat table and pt completed transfer with CGA, able to scoot herself backwards in long sitting to complete transfer. Her brother also actively observing.    Pt returned to her room in w/c - requested assistance to use bathroom - RN made aware. Pt and brother report readiness for DC tomorrow. All needs met. Treatment team updated.    Therapy Documentation Precautions:  Precautions Precautions: Fall Required Braces or Orthoses: Knee Immobilizer - Left Knee Immobilizer - Left: On at all times Restrictions Weight Bearing Restrictions: Yes LLE Weight Bearing: Non weight bearing General:    Therapy/Group: Individual Therapy  Alger Simons 04/10/2021, 7:51 AM

## 2021-04-10 NOTE — Progress Notes (Signed)
Slept good. PRN tylenol and melatonin given at 2335. Reports she's now discharging to her apartment alone, her and her Mom got into an argument. Patient tearful talking about the argument and  current situation. "I'm just trying to stay positive." Getting up to [facility], occasionally asking for bedpan. Left foot with thick dry skin, soaked with warm water, then lotion applied. LLE edema. Multi foam dressings in place, along with 1 surgical dressing. Asking if sutures can be dc'd before she's discharged. Patrici Ranks A

## 2021-04-10 NOTE — Progress Notes (Signed)
Occupational Therapy Session Note  Patient Details  Name: Meagan Oneill MRN: [id number] Date of Birth: [birth year]  Today's Date: 04/10/2021 OT Individual Time: 4010-2725 OT Individual Time Calculation (min): 47 min  and Today's Date: 04/10/2021 OT Missed Time: 28 Minutes Missed Time Reason: Patient fatigue   Short Term Goals: Week 1:  OT Short Term Goal 1 (Week 1): STGs = LTGs   Skilled Therapeutic Interventions/Progress Updates:    Pt greeted at time of OT session bed level resting, easily woken with verbal stimuli but stating she is tired but willing to participate. Pt confirmed info from morning PT - that she will no longer be going home to stay with mother but will be going home to her own independent apartment with 3STE and will not have 24/7 supervision to assist. Extensive discussion with pt regarding apartment set up and accessibility, drawing out diagram and deciding where to place chairs for rest breaks, wheelchair for accessibitt, etc. Pt will be able to hop room > chair to rest > wheelchair in living room and access kitchen and remainder of apartment in this manner. Will be able to sponge bathe from kitchen sink level when she goes home and will not be going into bathroom at all as it is not RW/wheelchair accessible per pt. Pt plans to use [facility] and discussed having assist from family/friends to empty as well as techniques to do so. Pt able to don/doff KI with independence and perform stand pivot bed <> BSC with set up of RW. Pt able to lift gown out of the way for clothing management and then performing hygiene bed level with set up. Pt plans to have wipes/hygiene equipment at bed level at home. Pt politely declining further OT at this time, stating she is fatigued. Missed 28 mins of OT.    Therapy Documentation Precautions:  Precautions Precautions: Fall Required Braces or Orthoses: Knee Immobilizer - Left Knee Immobilizer - Left: On at all times Restrictions Weight Bearing  Restrictions: Yes LLE Weight Bearing: Non weight bearing     Therapy/Group: Individual Therapy  Erasmo Score 04/10/2021, 7:29 AM

## 2021-04-10 NOTE — Discharge Summary (Signed)
Physical Therapy Discharge Summary  Patient Details  Name: Niger Marcom MRN: [id number] Date of Birth: Jul 08, 1990  Patient has met 6 of 9 long term goals due to improved activity tolerance, improved balance, increased strength, decreased pain, and ability to compensate for deficits.  Patient to discharge at a wheelchair level Modified Independent.   Patient's care partner is independent to provide the necessary physical assistance at discharge. Patient's discharge plan changed on morning of 1/1 due to argument with her mother. Plan is now to DC to her independent apartment which is 1 lvl but has 3 STE. Family education/training with pt's brother completed on the afternoon of 1/1 to review pt's current mobility status, review NWB and KI at all times on LLE, complete dependent stair transfer via bumping down in wheelchair, and car transfers. Pt and brother report feeling confident and comfortable with upcoming DC. They also report they will ask for assist from other family members to manage stairs and provide PRN assist throughout the day.   Reasons goals not met: Pt unable to self propel w/c > 54ft due to fatigue. Body habitus impacting this as well as general deconditioning. She required CGA for safety while completing car transfer - increased unsteadiness due to WB restrictions and body habitus.   Recommendation:  Patient will benefit from ongoing skilled PT services in outpatient setting to continue to advance safe functional mobility, address ongoing impairments in dynamic standing balance, gait, functional mobility, and minimize fall risk.  Equipment: Wheelchair. Pt owns RW.  Reasons for discharge: treatment goals met and discharge from hospital  Patient/family agrees with progress made and goals achieved: Yes  PT Discharge Precautions/Restrictions Precautions Precautions: Fall Required Braces or Orthoses: Knee Immobilizer - Left Knee Immobilizer - Left: On at all  times Restrictions Weight Bearing Restrictions: Yes LLE Weight Bearing: Non weight bearing Pain Interference Pain Interference Pain Effect on Sleep: 3. Frequently Pain Interference with Therapy Activities: 2. Occasionally Pain Interference with Day-to-Day Activities: 3. Frequently Vision/Perception  Vision - History Ability to See in Adequate Light: 0 Adequate Perception Perception: Within Functional Limits Praxis Praxis: Intact  Cognition Overall Cognitive Status: Within Functional Limits for tasks assessed Arousal/Alertness: Awake/alert Orientation Level: Oriented X4 Awareness: Appears intact Problem Solving: Appears intact Safety/Judgment: Appears intact Sensation Sensation Light Touch: Appears Intact Hot/Cold: Appears Intact Proprioception: Appears Intact Stereognosis: Appears Intact Coordination Gross Motor Movements are Fluid and Coordinated: Yes Fine Motor Movements are Fluid and Coordinated: Yes Motor  Motor Motor: Within Functional Limits Motor - Discharge Observations: generalized weakness/deconditioning - improved since date of evaluation  Mobility Bed Mobility Bed Mobility: Rolling Right;Rolling Left;Sit to Supine;Supine to Sit Rolling Right: Independent Rolling Left: Independent Supine to Sit: Independent with assistive device Sit to Supine: Independent with assistive device Transfers Transfers: Sit to Stand;Stand to Sit;Stand Pivot Transfers Sit to Stand: Independent with assistive device Stand to Sit: Independent with assistive device Stand Pivot Transfers: Independent with assistive device Transfer (Assistive device): Rolling walker Locomotion  Gait Ambulation: Yes Gait Assistance: Independent with assistive device Gait Distance (Feet): 25 Feet Assistive device: Rolling walker Gait Gait: Yes Gait Pattern: Impaired (hop-to gait pattern due to NWB restrictions on LLE) Gait Pattern: Step-to pattern;Antalgic;Decreased stride length;Decreased step  length - right;Poor foot clearance - right Gait velocity: decreased Stairs / Additional Locomotion Stairs: Yes Stairs Assistance: 2 Helpers;Dependent - Patient 0% Stair Management Technique: Wheelchair Number of Stairs: 4 Height of Stairs: 6 Pick up small object from the floor assist level: Minimal Assistance - Patient > 75% Pick up  small object from the floor assistive device: [employer] fast streamer: Yes Wheelchair Assistance: Independent with Camera operator: Both upper extremities Wheelchair Parts Management: Supervision/cueing Distance: 40ft  Trunk/Postural Assessment  Cervical Assessment Cervical Assessment: Within Functional Limits Thoracic Assessment Thoracic Assessment: Exceptions to [facility] (rounded shoulders) Lumbar Assessment Lumbar Assessment: Within Functional Limits Postural Control Postural Control: Within Functional Limits  Balance Balance Balance Assessed: Yes Static Sitting Balance Static Sitting - Balance Support: Feet supported;Bilateral upper extremity supported Static Sitting - Level of Assistance: 7: Independent Dynamic Sitting Balance Dynamic Sitting - Balance Support: Feet supported;No upper extremity supported Dynamic Sitting - Level of Assistance: 7: Independent Static Standing Balance Static Standing - Balance Support: Bilateral upper extremity supported Static Standing - Level of Assistance: 6: Modified independent (Device/Increase time) Dynamic Standing Balance Dynamic Standing - Balance Support: Bilateral upper extremity supported Dynamic Standing - Level of Assistance: 6: Modified independent (Device/Increase time) Extremity Assessment      RLE Assessment RLE Assessment: Within Functional Limits LLE Assessment LLE Assessment: Exceptions to [facility] Strength Comments: Limited assessment due to KI at all times and knee injuries    Satoru Milich P Kathan Kirker PT 04/10/2021, 2:02 PM

## 2021-04-10 NOTE — Discharge Summary (Signed)
Physician Discharge Summary  Patient ID: Meagan Oneill MRN: [id number] DOB/AGE: 1990/07/15 31 y.o.  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 FINDINGS: Fluoroscopic guidance was provided for left knee surgery. Fluoroscopic time 17.7 seconds. Fluoroscopic dose 2.19 mGy. IMPRESSION: Fluoroscopic assistance was provided for left knee surgery. Electronically Signed   By: Elmer Picker M.D.   On: 03/25/2021 12:58   DG C-Arm 1-60 Min-No Report  Result Date: 03/25/2021 Fluoroscopy was utilized by the requesting physician.  No radiographic interpretation.   DG C-Arm 1-60 Min-No Report  Result Date: 03/25/2021 Fluoroscopy was utilized by the requesting physician.  No radiographic interpretation.   DG C-Arm 1-60 Min-No Report  Result Date: 03/25/2021 Fluoroscopy was utilized by the requesting physician.  No radiographic interpretation.   VAS [place] LOWER EXTREMITY VENOUS (DVT)  Result Date: 04/06/2021  Lower Venous DVT Study Patient Name:  Meagan Michelin  Date of Exam:   04/06/2021 Medical Rec #: [id number]    Accession #:    [id number] Date of Birth: July 21, 1990     Patient Gender: F Patient Age:   1 years Exam Location:  [facility] Procedure:      VAS [place] LOWER EXTREMITY VENOUS (DVT) Referring Phys: Lauraine Rinne --------------------------------------------------------------------------------  Indications:  Swelling.  Risk Factors: Surgery Trauma. Limitations: Body habitus, poor ultrasound/tissue interface and bandages. Comparison Study: No prior studies. Performing Technologist: Oliver Hum RVT  Examination Guidelines: A complete evaluation includes B-mode imaging, spectral Doppler, color Doppler, and power Doppler as needed of all accessible portions of each vessel. Bilateral testing is considered an integral part of a complete examination. Limited examinations for reoccurring indications may be performed as noted. The reflux portion of the exam is performed with the patient in reverse Trendelenburg.  +---------+---------------+---------+-----------+----------+--------------+  RIGHT     Compressibility Phasicity Spontaneity Properties Thrombus Aging  +---------+---------------+---------+-----------+----------+--------------+  CFV       Full            Yes       Yes                                    +---------+---------------+---------+-----------+----------+--------------+  SFJ       Full                                                             +---------+---------------+---------+-----------+----------+--------------+  FV Prox   Full                                                             +---------+---------------+---------+-----------+----------+--------------+  FV Mid    Full                                                             +---------+---------------+---------+-----------+----------+--------------+  FV Distal Full                                                             +---------+---------------+---------+-----------+----------+--------------+  PFV       Full                                                             +---------+---------------+---------+-----------+----------+--------------+  POP       Full            Yes       Yes                                    +---------+---------------+---------+-----------+----------+--------------+  PTV       Full                                                              +---------+---------------+---------+-----------+----------+--------------+  PERO      Full                                                             +---------+---------------+---------+-----------+----------+--------------+   +---------+---------------+---------+-----------+----------+-------------------+  LEFT      Compressibility Phasicity Spontaneity Properties Thrombus Aging       +---------+---------------+---------+-----------+----------+-------------------+  CFV       Full            Yes       Yes                                         +---------+---------------+---------+-----------+----------+-------------------+  SFJ       Full                                                                  +---------+---------------+---------+-----------+----------+-------------------+  FV Prox   Full                                                                  +---------+---------------+---------+-----------+----------+-------------------+  FV Mid    Full                                                                  +---------+---------------+---------+-----------+----------+-------------------+  FV Distal                 Yes       Yes                                         +---------+---------------+---------+-----------+----------+-------------------+  PFV       Full                                                                  +---------+---------------+---------+-----------+----------+-------------------+  POP       Full            Yes       Yes                                         +---------+---------------+---------+-----------+----------+-------------------+  PTV       Full                                                                  +---------+---------------+---------+-----------+----------+-------------------+  PERO                                                       Not well visualized   +---------+---------------+---------+-----------+----------+-------------------+     Summary: RIGHT: - There is no evidence of deep vein thrombosis in the lower extremity. However, portions of this examination were limited- see technologist comments above.  - No cystic structure found in the popliteal fossa.  LEFT: - There is no evidence of deep vein thrombosis in the lower extremity. However, portions of this examination were limited- see technologist comments above.  - No cystic structure found in the popliteal fossa.  *See table(s) above for measurements and observations. Electronically signed by Jamelle Haring on 04/06/2021 at 3:19:23 PM.    Final     Labs:  Basic Metabolic Panel: Recent Labs  Lab 04/06/21 0639  NA 137  K 3.6  CL 104  CO2 27  GLUCOSE 103*  BUN 9  CREATININE 0.70  CALCIUM 8.9    CBC: Recent Labs  Lab 04/06/21 0639 04/08/21 0549  WBC 9.0 8.4  NEUTROABS 5.3  --   HGB 9.1* 9.1*  HCT 29.9* 29.6*  MCV 66.9* 66.7*  PLT 230 220    CBG:  No results for input(s): GLUCAP in the last 168 hours.  Family history.  Mother with hypertension and diabetes.  Sister with hypertension.  Denies any colon cancer esophageal cancer or rectal cancer  Brief HPI:   Meagan Sudderth is a 31 y.o. right-handed female with unremarkable past medical history except obesity with BMI 67.17.  Per chart review lives with parent who physically cannot provide assistance.  Per chart review patient works as a [employer].  Approximately 6 weeks ago patient sustained a low-energy knee dislocation while at work.  MRI showed injury to the ACL and PCL femoral avulsion of the lateral side injury.  Presented 03/25/2021 with persistent increasing knee pain.  Patient underwent arthroscopic PCL reconstruction arthroscopic ACL reconstruction using bone patella tendon bone allograft lateral posterior horn medial meniscal root repair.  Posterior lateral corner reconstruction with popliteal fibular ligament reconstruction and  lateral collateral ligament reconstruction with peroneal nerve neurolysis 03/26/2021 per Dr. Marcene Duos.  Knee immobilizer at all times nonweightbearing left lower extremity.  Maintained on Xarelto for DVT prophylaxis.  Acute blood loss anemia 9.6 and monitored.  Hospital course dysuria urinalysis WBC 11-20,000 few bacteria placed on Keflex 04/04/2021.  Therapy evaluations completed due to patient decreased functional mobility was admitted for a comprehensive rehab program.   Hospital Course: Meagan Garzon was admitted to rehab 04/05/2021 for inpatient therapies to consist of PT, ST and OT at least three hours five days a week. Past admission physiatrist, therapy team and rehab RN have worked together to provide customized collaborative inpatient rehab.  Pertaining to patient's debility related to left knee dislocation posterior and anterior cruciate ligament tear lateral meniscal root avulsion with posterior lateral corner injury involving the popliteal fibular ligament and lateral collateral ligament/peroneal nerve scarring status post arthroscopic PCL/ACL reconstruction lateral posterior horn medial meniscal root repair/posterior lateral corner reconstruction popliteal fibular ligament and lateral collateral ligament reconstruction with peroneal nerve neurolysis 03/25/2021.  Patient would follow-up with orthopedic services.  Neurovascular sensation intact.  Nonweightbearing with knee immobilizer.  She remained on Xarelto for DVT prophylaxis she will complete course no bleeding episodes venous Doppler studies negative.  Pain managed with use of scheduled Celebrex Robaxin oxycodone/tramadol as needed.  Acute blood loss anemia stable latest hemoglobin 9.1 no gross blood loss.  Blood pressure heart rate controlled Lopressor 25 mg twice daily no chest pain or shortness of breath.  Completed course of Keflex for suspected UTI no dysuria or hematuria.  Morbid obesity BMI 67.17 dietary follow-up.  Fungal UTI Diflucan  ordered with benefit.   Blood pressures were monitored on TID basis and controlled     Rehab course: During patient's stay in rehab weekly team conferences were held to monitor patient's progress, set goals and discuss barriers to discharge. At admission, patient required minimal guard 20 feet rolling walker minimal assist step pivot transfers  Physical exam.  Blood pressure 142/77 pulse 95 temperature 98.1 respirations 18 oxygen saturations 99% room air Constitutional.  No acute distress HEENT Head.  Normocephalic and atraumatic Eyes.  Pupils round and reactive to light no discharge without nystagmus Neck.  Supple nontender no JVD without thyromegaly Cardiac regular rate and rhythm any extra sounds or murmur heard Abdomen.  Soft obese positive bowel sounds without rebound Respiratory effort normal no respiratory distress without wheeze Musculoskeletal.  No rigidity Comments.  Upper extremities 5/5 in biceps triceps wrist extension grip and FA B/L Right lower extremity 5/5 in hip flexors knee extension knee flexion dorsi plantarflexion Left lower extremity knee immobilizer hip flexor  at least 3+/5 in dorsi plantarflexion at least 3/5 inhibited by pain Skin.  Surgical site incision with dressing in place clean and dry Neurologic.  Alert oriented x3 no acute distress  He/She  has had improvement in activity tolerance, balance, postural control as well as ability to compensate for deficits. He/She has had improvement in functional use RUE/LUE  and RLE/LLE as well as improvement in awareness.  Donned left lower extremity knee immobilizer hospital sock and T-shirt with set up.  Bed mobility completed modified independent.  Sit to stand modified independent a rolling walker.  She is compliant with her nonweightbearing status.  Stand pivot transfers modified independent to wheelchair rolling walker.  Propels her wheelchair independent.  Ambulates 25 feet modified independent rolling walker.   Patient bumped for by four 6 inch steps at dependent level.  Long discussion with patient on returning back to her apartment alone recommendations a 24-hour 7 supervision to assist.  Full family teaching completed plan discharged to home       Disposition: Discharged to home    Diet: Regular  Special Instructions: No driving smoking or alcohol  Knee immobilizer at all times nonweightbearing left lower extremity  Medications at discharge. 1.  Tylenol as needed 2.  Celebrex 200 mg p.o. twice daily 3.  Colace 100 mg p.o. twice daily 4.  Melatonin 3 mg p.o. nightly 5.  Robaxin 500 mg p.o. every 6 hours as needed muscle spasms 6.  Lopressor 25 mg p.o. twice daily 7.  Tramadol 50 mg every 6 hours as needed 8.  Xarelto 10 mg p.o. daily completing course for DVT prophylaxis  30-35 minutes were spent completing discharge summary and discharge planning     Follow-up Information     Marlou Sa, Tonna Corner, MD Follow up.   Specialty: Orthopedic Surgery Why: Call for appointment Contact information: [place] [address] [phone]         Meredith Staggers, MD Follow up.   Specialty: Physical Medicine and Rehabilitation Why: As needed Contact information: [address] [place] [address] [phone]                 Signed: Cathlyn Parsons 04/10/2021, 4:24 PM

## 2021-04-11 ENCOUNTER — Other Ambulatory Visit (HOSPITAL_COMMUNITY): Payer: Self-pay

## 2021-04-11 MED ORDER — HYDROCERIN EX CREA: 1.0000 "application " | TOPICAL_CREAM | Freq: Three times a day (TID) | CUTANEOUS | 0 refills | Status: DC | PRN

## 2021-04-11 MED ORDER — RIVAROXABAN 10 MG PO TABS
10.0000 mg | ORAL_TABLET | Freq: Every day | ORAL | 0 refills | Status: DC
  Filled 2021-04-11: qty 30, 30d supply, fill #0

## 2021-04-11 MED ORDER — DOCUSATE SODIUM 100 MG PO CAPS
100.0000 mg | ORAL_CAPSULE | Freq: Two times a day (BID) | ORAL | 0 refills | Status: DC
  Filled 2021-04-11: qty 60, 30d supply, fill #0

## 2021-04-11 MED ORDER — DOXYCYCLINE HYCLATE 100 MG PO TABS: 100.0000 mg | ORAL_TABLET | Freq: Two times a day (BID) | ORAL | Status: DC

## 2021-04-11 MED ORDER — CELECOXIB 200 MG PO CAPS
200.0000 mg | ORAL_CAPSULE | Freq: Every day | ORAL | 0 refills | Status: DC
  Filled 2021-04-11: qty 30, 30d supply, fill #0

## 2021-04-11 MED ORDER — METHOCARBAMOL 500 MG PO TABS
500.0000 mg | ORAL_TABLET | Freq: Four times a day (QID) | ORAL | 0 refills | Status: DC | PRN
  Filled 2021-04-11: qty 30, 8d supply, fill #0

## 2021-04-11 MED ORDER — FLUCONAZOLE 100 MG PO TABS
100.0000 mg | ORAL_TABLET | Freq: Once | ORAL | 0 refills | Status: AC
  Filled 2021-04-11: qty 1, 1d supply, fill #0

## 2021-04-11 MED ORDER — MELATONIN 3 MG PO TABS
3.0000 mg | ORAL_TABLET | Freq: Every day | ORAL | 0 refills | Status: DC
  Filled 2021-04-11: qty 30, 30d supply, fill #0

## 2021-04-11 MED ORDER — DOXYCYCLINE HYCLATE 100 MG PO TABS
100.0000 mg | ORAL_TABLET | Freq: Two times a day (BID) | ORAL | 0 refills | Status: DC
  Filled 2021-04-11: qty 20, 10d supply, fill #0

## 2021-04-11 MED ORDER — METOPROLOL TARTRATE 25 MG PO TABS
25.0000 mg | ORAL_TABLET | Freq: Two times a day (BID) | ORAL | 0 refills | Status: DC
  Filled 2021-04-11: qty 60, 30d supply, fill #0

## 2021-04-11 MED ORDER — TRAMADOL HCL 50 MG PO TABS
50.0000 mg | ORAL_TABLET | Freq: Four times a day (QID) | ORAL | 0 refills | Status: DC | PRN
  Filled 2021-04-11: qty 24, 6d supply, fill #0

## 2021-04-11 NOTE — Progress Notes (Signed)
Inpatient Rehabilitation Discharge Medication Review by a Pharmacist  A complete drug regimen review was completed for this patient to identify any potential clinically significant medication issues.  High Risk Drug Classes Is patient taking? Indication by Medication  Antipsychotic No   Anticoagulant Yes Xarelto for VTE ppx  Antibiotic No   Opioid Yes Oxycodone for pain  Antiplatelet No   Hypoglycemics/insulin No   Vasoactive Medication Yes Lopressor for BP  Chemotherapy No   Other No      Type of Medication Issue Identified Description of Issue Recommendation(s)  Drug Interaction(s) (clinically significant)     Duplicate Therapy     Allergy     No Medication Administration End Date     Incorrect Dose     Additional Drug Therapy Needed     Significant med changes from prior encounter (inform family/care partners about these prior to discharge).    Other       Clinically significant medication issues were identified that warrant physician communication and completion of prescribed/recommended actions by midnight of the next day:  No  Pharmacist comments: None  Time spent performing this drug regimen review (minutes): 20 minutes   Elwin Sleight 04/11/2021 8:25 AM

## 2021-04-11 NOTE — Progress Notes (Addendum)
° °   Discussed patient with Dr. Magnus Ivan who recommended leaving sutures in till follow up with Dr. August Saucer. Patient noted to have drainage under her surgical medipore dressing which was removed. Edges of wound appear to be macerate centrally and few drops of drainage expressed from mid aspect. Will paint incision with betadine and start patient of doxycycline for wound prophylaxis (works at [employer] and had fall at work).

## 2021-04-11 NOTE — Progress Notes (Addendum)
Patient ID: Meagan Oneill, female   DOB: 12/14/1990, 31 y.o.   MRN: [id number] Team and MD feel pt ready for discharge today. Match placed for medication assistance and equipment delivered. [facility] would not accept referral so home exercise program given to pt.   9:00 Pt to go home with brother who has gone through family education and feel prepared for discharge.

## 2021-04-11 NOTE — Progress Notes (Signed)
Pt being discharged to home with personal belongings to home, discharge instructions provided by P.Love, PA-c, pt verbalized an understanding of medications, follow up appts, and discharge instructions.
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
## 2021-04-11 NOTE — Progress Notes (Signed)
TC delivered medications to pt.

## 2021-04-11 NOTE — Progress Notes (Signed)
PROGRESS NOTE   Subjective/Complaints: Started her period last night .  Ready for d/c today- on Xarelto Wants something to stop her period- sounds like since out of scope of practice, will need pt to see OB/Gyn or PCP.   ROS:  Pt denies SOB, abd pain, CP, N/V/C/D, and vision changes  Objective:   No results found. No results for input(s): WBC, HGB, HCT, PLT in the last 72 hours.  No results for input(s): NA, K, CL, CO2, GLUCOSE, BUN, CREATININE, CALCIUM in the last 72 hours.   Intake/Output Summary (Last 24 hours) at 04/11/2021 1101 Last data filed at 04/10/2021 2300 Gross per 24 hour  Intake 720 ml  Output --  Net 720 ml        Physical Exam: Vital Signs Blood pressure 125/75, pulse 82, temperature 98.2 F (36.8 C), resp. rate 14, height 5\' 4"  (1.626 m), SpO2 100 %.   General: awake, alert, appropriate, sitting up in bed; BMI >60NAD HENT: conjugate gaze; oropharynx moist CV: regular rate; no JVD Pulmonary: CTA B/L; no W/R/R- good air movement GI: soft, NT, ND, (+)BS Psychiatric: appropriate Neurological: Ox3 Skin- staples still in place Neuro:  Alert and oriented x 3. Normal insight and awareness. Intact Memory. Normal language and speech. Cranial nerve exam unremarkable. UE motor 5/5. LLE limited by pain, able to move ankle without issue. RLE 3/5 prox to  4/5 distally, limited by pain. No sensory findings in RLE Musculoskeletal: wounds dressed, clean left knee, foam dressings. Left calf sl tender with palpation. LLE with generalized swelling--stable in appearance    Assessment/Plan: 1. Functional deficits which require 3+ hours per day of interdisciplinary therapy in a comprehensive inpatient rehab setting. Physiatrist is providing close team supervision and 24 hour management of active medical problems listed below. Physiatrist and rehab team continue to assess barriers to discharge/monitor patient progress  toward functional and medical goals  Care Tool:  Bathing    Body parts bathed by patient: Right arm, Left arm   Body parts bathed by helper: Buttocks Body parts n/a: Left upper leg   Bathing assist Assist Level: Set up assist     Upper Body Dressing/Undressing Upper body dressing   What is the patient wearing?: Pull over shirt    Upper body assist Assist Level: Independent    Lower Body Dressing/Undressing Lower body dressing    Lower body dressing activity did not occur: N/A What is the patient wearing?: Hospital gown only     Lower body assist Assist for lower body dressing: Set up assist     Toileting Toileting    Toileting assist Assist for toileting: Set up assist     Transfers Chair/bed transfer  Transfers assist     Chair/bed transfer assist level: Independent with assistive device Chair/bed transfer assistive device: Programmer, multimedia   Ambulation assist      Assist level: Independent with assistive device Assistive device: Walker-rolling Max distance: 5ft   Walk 10 feet activity   Assist     Assist level: Independent with assistive device Assistive device: Walker-rolling   Walk 50 feet activity   Assist Walk 50 feet with 2 turns activity did not  occur: Safety/medical concerns         Walk 150 feet activity   Assist Walk 150 feet activity did not occur: Safety/medical concerns         Walk 10 feet on uneven surface  activity   Assist Walk 10 feet on uneven surfaces activity did not occur: Safety/medical concerns         Wheelchair     Assist Is the patient using a wheelchair?: Yes Type of Wheelchair: Manual    Wheelchair assist level: Independent Max wheelchair distance: 50    Wheelchair 50 feet with 2 turns activity    Assist        Assist Level: Independent   Wheelchair 150 feet activity     Assist      Assist Level: Maximal Assistance - Patient 25 - 49% (fatigue)    Blood pressure 125/75, pulse 82, temperature 98.2 F (36.8 C), resp. rate 14, height 5\' 4"  (1.626 m), SpO2 100 %.  Medical Problem List and Plan: 1.  Debility functional deficits secondary to fall resulting in left knee dislocation/posterior and anterior cruciate ligament tear/lateral meniscal root avulsion/posterior lateral corner injury involving the popliteal fibular ligament and lateral collateral ligament/peroneal nerve scarring.S/P arthroscopic PCL ACL reconstruction/lateral posterior horn medial meniscal root repair/posterior lateral corner reconstruction popliteal fibular ligament and lateral collateral ligament reconstruction with peroneal nerve neurolysis 03/25/2021.   -Nonweightbearing with knee immobilizer at all times             -patient may  shower- cover knee on L and use KI             -ELOS/Goals: might need just a few more days. Mod I goals  -Continue CIR therapies including PT, OT  -please check with ortho if sutures can be removed prior to d/c tomorrow  1/2- will check with surgery if can remove staples/sutures- d/c today to [facility].  2.  Antithrombotics: -DVT/anticoagulation:  Pharmaceutical: Xarelto  -dopplers  negative             -antiplatelet therapy: N/A 3. Pain: continue Celebrex 200 mg twice daily, Robaxin as needed/oxycodone as needed- pt only using tylenol and robaxin currently.   -pain controlled  1/2- will send home on Celebrex 200 mg daily.  4. Mood: [place]ita motional support             -antipsychotic agents: N/A 5. Neuropsych: This patient is capable of making decisions on her own behalf. 6. Skin/Wound Care: incisions all clean, intact and dry 7. Fluids/Electrolytes/Nutrition: encourage po   -added protein supps for low albumin 8.  Acute blood loss anemia.    no gross blood loss  -12/30 hgb holding at 9.1 9.  Hypertension. Continue Lopressor 25 mg twice daily.  Monitor with increased mobility 10.  Suspected UTI.  Empiric Keflex   -no culture ordered  initially -completed keflex course UC with <10,000 colonies, insignificant growth, discussed this could be since she was already treated with antibiotics.  11.  Morbid obesity.  BMI 67.17.  Dietary follow-up 12. Fungal UTI: diflucan ordered with benefit, continue 13. Monthly cycle  1/2- will have pt speak to PCP about starting something for period cessation- doesn't start something BCPs -wise until next Sunday so has time.     LOS: 6 days A FACE TO FACE EVALUATION WAS PERFORMED  Enoc Getter 04/11/2021, 11:01 AM

## 2021-04-13 NOTE — Discharge Summary (Signed)
Physician Discharge Summary      Patient ID: Meagan Oneill MRN: [id number] DOB/AGE: 1990-06-02 31 y.o.  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 female who was admitted 03/25/2021 with a chief complaint of left knee instability, and found to have a diagnosis of left knee ACL tear, posterior lateral corner injury, PCL tear, meniscal root tear of the lateral meniscus.  They were brought to the operating room on 03/25/2021 and underwent the above named procedures.  Pt awoke from anesthesia without complication and was transferred to the floor. On POD1, p patient's pain was well controlled.  She was on Xarelto for DVT prophylaxis throughout her stay.  She is nonweightbearing until about a month out from surgery but she was able to progress with her mobility to the point that she was able to stand and pivot herself to the bedside chair while maintaining her nonweightbearing status.  Dressings were removed after about a week postoperatively to check the incision.  Incisional VAC was removed.  There is no sign of infection at the time.  Incisions were redressed and plan for suture removal in the future.  She was neurovascularly intact with every examination.  Her situation is complicated by her higher  BMI and history of knee dislocation.  Plan for nonweightbearing for about 4 to 6 weeks and then progressively increase weightbearing at that point.  She was discharged to [facility] for [facility] rehabilitation.  She did develop some sedimentation in her urine with discoloration and a urine culture was obtained with antibiotics started prior to her discharge.  She had no sign of pyelonephritis or systemic infection.  Pt will f/u with Dr. Marlou Sa in clinic in ~2 weeks.   Antibiotics given:  Anti-infectives (From admission, onward)    Start     Dose/Rate Route Frequency Ordered Stop   04/05/21 0000  cephALEXin (KEFLEX) 500 MG capsule  Status:  Discontinued        500 mg Oral Every 6 hours 04/05/21 1312 04/11/21    04/04/21 0100  cephALEXin (KEFLEX) capsule 500 mg  Status:  Discontinued        500 mg Oral Every 6 hours 04/04/21 0002 04/05/21 1433   03/25/21 2200  ceFAZolin (ANCEF) IVPB 2g/100 mL premix        2 g 200 mL/hr over 30 Minutes Intravenous Every 8 hours 03/25/21 2032 03/27/21 1459   03/25/21 1048  vancomycin (VANCOCIN) powder  Status:  Discontinued          As needed 03/25/21 1048 03/25/21 1629   03/25/21 0600  ceFAZolin (ANCEF) IVPB 3g/100 mL premix        3 g 200 mL/hr over 30 Minutes Intravenous On call to O.R. 03/25/21 [id number] 03/25/21 1552     .  Recent vital signs:  Vitals:   04/04/21 2138 04/05/21 0853  BP: (!) 142/77 116/74  Pulse: 95  86  Resp: 18   Temp: 98.1 F (36.7 C) 98.2 F (36.8 C)  SpO2: 99% 100%    Recent laboratory studies:  Results for orders placed or performed during the hospital encounter of [id number]  Basic metabolic panel  Result Value Ref Range   Sodium 138 135 - 145 mmol/L   Potassium 3.8 3.5 - 5.1 mmol/L   Chloride 104 98 - 111 mmol/L   CO2 27 22 - 32 mmol/L   Glucose, Bld 123 (H) 70 - 99 mg/dL   BUN 8 6 - 20 mg/dL   Creatinine, Ser 0.87 0.44 - 1.00 mg/dL   Calcium 8.6 (L) 8.9 - 10.3 mg/dL   GFR, Estimated >60 >60 mL/min   Anion gap 7 5 - 15  CBC   Result Value Ref Range   WBC 13.3 (H) 4.0 - 10.5 K/uL   RBC 5.09 3.87 - 5.11 MIL/uL   Hemoglobin 10.3 (L) 12.0 - 15.0 g/dL   HCT 34.5 (L) 36.0 - 46.0 %   MCV 67.8 (L) 80.0 - 100.0 fL   MCH 20.2 (L) 26.0 - 34.0 pg   MCHC 29.9 (L) 30.0 - 36.0 g/dL   RDW 21.1 (H) 11.5 - 15.5 %   Platelets 130 (L) 150 - 400 K/uL   nRBC 0.0 0.0 - 0.2 %  CBC  Result Value Ref Range   WBC 9.6 4.0 - 10.5 K/uL   RBC 4.63 3.87 - 5.11 MIL/uL   Hemoglobin 9.6 (L) 12.0 - 15.0 g/dL   HCT 30.8 (L) 36.0 - 46.0 %   MCV 66.5 (L) 80.0 - 100.0 fL   MCH 20.7 (L) 26.0 - 34.0 pg   MCHC 31.2 30.0 - 36.0 g/dL   RDW 20.5 (H) 11.5 - 15.5 %   Platelets 183 150 - 400 K/uL   nRBC 0.0 0.0 - 0.2 %  CBC with Differential/Platelet  Result Value Ref Range   WBC 10.4 4.0 - 10.5 K/uL   RBC 4.84 3.87 - 5.11 MIL/uL   Hemoglobin 9.8 (L) 12.0 - 15.0 g/dL   HCT 32.6 (L) 36.0 - 46.0 %   MCV 67.4 (L) 80.0 - 100.0 fL   MCH 20.2 (L) 26.0 - 34.0 pg   MCHC 30.1 30.0 - 36.0 g/dL   RDW 20.7 (H) 11.5 - 15.5 %   Platelets 241 150 - 400 K/uL   nRBC 0.0 0.0 - 0.2 %   Neutrophils Relative % 67 %   Neutro Abs 7.0 1.7 - 7.7 K/uL   Lymphocytes Relative 25 %   Lymphs Abs 2.6 0.7 - 4.0 K/uL   Monocytes Relative 5 %   Monocytes Absolute 0.5 0.1 - 1.0 K/uL   Eosinophils Relative 3 %   Eosinophils Absolute 0.3 0.0 - 0.5 K/uL   Basophils Relative 0 %   Basophils Absolute 0.0 0.0 - 0.1 K/uL   Immature Granulocytes 0 %   Abs Immature Granulocytes 0.04 0.00 - 0.07 K/uL  Comprehensive metabolic panel  Result Value Ref Range   Sodium 135 135 - 145 mmol/L   Potassium 3.9 3.5 - 5.1 mmol/L   Chloride 101 98 - 111 mmol/L   CO2 27 22 - 32 mmol/L   Glucose, Bld 97 70 - 99 mg/dL   BUN 9 6 - 20 mg/dL   Creatinine, Ser 0.61 0.44 - 1.00 mg/dL   Calcium 8.7 (L) 8.9 - 10.3 mg/dL   Total Protein 6.7 6.5 - 8.1 g/dL   Albumin 2.5 (L) 3.5 - 5.0 g/dL  AST 17 15 - 41 U/L   ALT 15 0 - 44 U/L   Alkaline Phosphatase 55 38 - 126 U/L   Total Bilirubin 0.6 0.3  - 1.2 mg/dL   GFR, Estimated >60 >60 mL/min   Anion gap 7 5 - 15  Urinalysis, Routine w reflex microscopic Urine, Clean Catch  Result Value Ref Range   Color, Urine YELLOW YELLOW   APPearance HAZY (A) CLEAR   Specific Gravity, Urine >1.030 (H) 1.005 - 1.030   pH 6.0 5.0 - 8.0   Glucose, UA NEGATIVE NEGATIVE mg/dL   Hgb urine dipstick LARGE (A) NEGATIVE   Bilirubin Urine NEGATIVE NEGATIVE   Ketones, ur NEGATIVE NEGATIVE mg/dL   Protein, ur NEGATIVE NEGATIVE mg/dL   Nitrite NEGATIVE NEGATIVE   Leukocytes,Ua NEGATIVE NEGATIVE  Urinalysis, Microscopic (reflex)  Result Value Ref Range   RBC / HPF 21-50 0 - 5 RBC/hpf   WBC, UA 11-20 0 - 5 WBC/hpf   Bacteria, UA FEW (A) NONE SEEN   Squamous Epithelial / LPF 21-50 0 - 5   Mucus PRESENT   Pregnancy, urine POC  Result Value Ref Range   Preg Test, Ur NEGATIVE NEGATIVE    Discharge Medications:   Allergies as of 04/05/2021       Reactions   Morphine And Related Hives        Medication List     STOP taking these medications    ibuprofen 600 MG tablet Commonly known as: ADVIL   metroNIDAZOLE 500 MG tablet Commonly known as: FLAGYL   oxyCODONE-acetaminophen 5-325 MG tablet Commonly known as: PERCOCET/ROXICET       TAKE these medications    polyethylene glycol 17 g packet Commonly known as: MIRALAX / GLYCOLAX Take 17 g by mouth daily as needed for mild constipation.        Diagnostic Studies: DG Knee 1-2 Views Left  Result Date: 03/25/2021 CLINICAL DATA:  Fluoroscopic assistance for left knee surgery EXAM: LEFT KNEE - 1-2 VIEW COMPARISON:  None. FINDINGS: Fluoroscopic guidance was provided for left knee surgery. Fluoroscopic time 17.7 seconds. Fluoroscopic dose 2.19 mGy. IMPRESSION: Fluoroscopic assistance was provided for left knee surgery. Electronically Signed   By: Elmer Picker M.D.   On: 03/25/2021 12:58   DG C-Arm 1-60 Min-No Report  Result Date: 03/25/2021 Fluoroscopy was utilized by the  requesting physician.  No radiographic interpretation.   DG C-Arm 1-60 Min-No Report  Result Date: 03/25/2021 Fluoroscopy was utilized by the requesting physician.  No radiographic interpretation.   DG C-Arm 1-60 Min-No Report  Result Date: 03/25/2021 Fluoroscopy was utilized by the requesting physician.  No radiographic interpretation.   VAS [place] LOWER EXTREMITY VENOUS (DVT)  Result Date: 04/06/2021  Lower Venous DVT Study Patient Name:  Meagan Oneill  Date of Exam:   04/06/2021 Medical Rec #: [id number]    Accession #:    [id number] Date of Birth: [birth year]     Patient Gender: F Patient Age:   22 years Exam Location:  [facility] Procedure:      VAS [place] LOWER EXTREMITY VENOUS (DVT) Referring Phys: Lauraine Rinne --------------------------------------------------------------------------------  Indications: Swelling.  Risk Factors: Surgery Trauma. Limitations: Body habitus, poor ultrasound/tissue interface and bandages. Comparison Study: No prior studies. Performing Technologist: Oliver Hum RVT  Examination Guidelines: A complete evaluation includes B-mode imaging, spectral Doppler, color Doppler, and power Doppler as needed of all accessible portions of each vessel. Bilateral testing is considered an integral part of a complete examination. Limited examinations for reoccurring  indications may be performed as noted. The reflux portion of the exam is performed with the patient in reverse Trendelenburg.  +---------+---------------+---------+-----------+----------+--------------+  RIGHT     Compressibility Phasicity Spontaneity Properties Thrombus Aging  +---------+---------------+---------+-----------+----------+--------------+  CFV       Full            Yes       Yes                                    +---------+---------------+---------+-----------+----------+--------------+  SFJ       Full                                                              +---------+---------------+---------+-----------+----------+--------------+  FV Prox   Full                                                             +---------+---------------+---------+-----------+----------+--------------+  FV Mid    Full                                                             +---------+---------------+---------+-----------+----------+--------------+  FV Distal Full                                                             +---------+---------------+---------+-----------+----------+--------------+  PFV       Full                                                             +---------+---------------+---------+-----------+----------+--------------+  POP       Full            Yes       Yes                                    +---------+---------------+---------+-----------+----------+--------------+  PTV       Full                                                             +---------+---------------+---------+-----------+----------+--------------+  PERO      Full                                                             +---------+---------------+---------+-----------+----------+--------------+   +---------+---------------+---------+-----------+----------+-------------------+  LEFT      Compressibility Phasicity Spontaneity Properties Thrombus Aging       +---------+---------------+---------+-----------+----------+-------------------+  CFV       Full            Yes       Yes                                         +---------+---------------+---------+-----------+----------+-------------------+  SFJ       Full                                                                  +---------+---------------+---------+-----------+----------+-------------------+  FV Prox   Full                                                                  +---------+---------------+---------+-----------+----------+-------------------+  FV Mid    Full                                                                   +---------+---------------+---------+-----------+----------+-------------------+  FV Distal                 Yes       Yes                                         +---------+---------------+---------+-----------+----------+-------------------+  PFV       Full                                                                  +---------+---------------+---------+-----------+----------+-------------------+  POP       Full            Yes       Yes                                         +---------+---------------+---------+-----------+----------+-------------------+  PTV       Full                                                                  +---------+---------------+---------+-----------+----------+-------------------+  PERO  Not well visualized  +---------+---------------+---------+-----------+----------+-------------------+     Summary: RIGHT: - There is no evidence of deep vein thrombosis in the lower extremity. However, portions of this examination were limited- see technologist comments above.  - No cystic structure found in the popliteal fossa.  LEFT: - There is no evidence of deep vein thrombosis in the lower extremity. However, portions of this examination were limited- see technologist comments above.  - No cystic structure found in the popliteal fossa.  *See table(s) above for measurements and observations. Electronically signed by Heath Lark on 04/06/2021 at 3:19:23 PM.    Final     Disposition: Discharge disposition: 02-Transferred to [facility]       Discharge Instructions     Call MD / Call 911   Complete by: As directed    If you experience chest pain or shortness of breath, CALL 911 and be transported to the hospital emergency room.  If you develope a fever above 101 F, pus (white drainage) or increased drainage or redness at the wound, or calf pain, call your surgeon's office.   Constipation Prevention   Complete by: As  directed    Drink plenty of fluids.  Prune juice may be helpful.  You may use a stool softener, such as Colace (over the counter) 100 mg twice a day.  Use MiraLax (over the counter) for constipation as needed.   Diet - low sodium heart healthy   Complete by: As directed    Discharge instructions   Complete by: As directed    Nonweightbearing on operative extremity; must use knee immobilizer at all times when ambulatory.  Elevate the leg to help with swelling.   Okay to flex knee to 90 degrees but not past 90 degrees. Do not need knee immobilizer in bed.  Work on getting the leg to straighten fully. Do not keep a pillow under your knee, keep a pillow under your heel instead.  Leave the water-proof dressing on.  You may shower or sponge-bathe with that waterproof dressing on but do not soak in a tub/pool.  We will remove the waterproof dressing at your postop appointment.   Call the office with any questions at [phone].  Follow-up with Dr. August Saucer in clinic at your given appointment date.   We will periodically check on you in inpatient rehab as well. We are working to complete your paperwork that was given to [place]   Increase activity slowly as tolerated   Complete by: As directed    Post-operative opioid taper instructions:   Complete by: As directed    POST-OPERATIVE OPIOID TAPER INSTRUCTIONS: It is important to wean off of your opioid medication as soon as possible. If you do not need pain medication after your surgery it is ok to stop day one. Opioids include: Codeine, Hydrocodone(Norco, Vicodin), Oxycodone(Percocet, oxycontin) and hydromorphone amongst others.  Long term and even short term use of opiods can cause: Increased pain response Dependence Constipation Depression Respiratory depression And more.  Withdrawal symptoms can include Flu like symptoms Nausea, vomiting And more Techniques to manage these symptoms Hydrate well Eat regular healthy meals Stay active Use relaxation  techniques(deep breathing, meditating, yoga) Do Not substitute Alcohol to help with tapering If you have been on opioids for less than two weeks and do not have pain than it is ok to stop all together.  Plan to wean off of opioids This plan should start within one week post op of your joint replacement. Maintain the same interval  or time between taking each dose and first decrease the dose.  Cut the total daily intake of opioids by one tablet each day Next start to increase the time between doses. The last dose that should be eliminated is the evening dose.             SignedDonella Stade 04/13/2021, 6:13 PM

## 2021-04-14 ENCOUNTER — Telehealth: Payer: Self-pay

## 2021-04-14 ENCOUNTER — Ambulatory Visit (INDEPENDENT_AMBULATORY_CARE_PROVIDER_SITE_OTHER): Payer: Self-pay | Admitting: Surgical

## 2021-04-14 ENCOUNTER — Other Ambulatory Visit: Payer: Self-pay

## 2021-04-14 DIAGNOSIS — S83105A Unspecified dislocation of left knee, initial encounter: Secondary | ICD-10-CM

## 2021-04-14 NOTE — Telephone Encounter (Signed)
Meagan Oneill, can you have [name] reach out to patient with cost of CPM as self pay s/p left knee ACL, PCL, PLCR reconstruction?  Sheri, can you find a HHPT agency that could possibly help her? She needs HHPT to start in 1 week NWTB focus on PROM and SLR  Patient was injured at work but is working with attorney to get this resolved.

## 2021-04-15 NOTE — Telephone Encounter (Signed)
Per Harrold Donath with [employer].Marland KitchenMarland KitchenI just spoke with the patient again. She still declines selfpay.

## 2021-04-16 NOTE — Telephone Encounter (Signed)
Can we try hhpt 2 x week for 4 w

## 2021-04-17 ENCOUNTER — Encounter: Payer: Self-pay | Admitting: Orthopedic Surgery

## 2021-04-17 NOTE — Progress Notes (Signed)
Post-Op Visit Note   Patient: Meagan Oneill           Date of Birth: 07-25-1990           MRN: [id number] Visit Date: 04/14/2021 PCP: Patient, No Pcp Per (Inactive)   Assessment & Plan:  Chief Complaint:  Chief Complaint  Patient presents with   Left Knee - Routine Post Op   Visit Diagnoses:  1. Left knee dislocation, initial encounter     Plan: Patient is a 31 year old female who presents s/p left knee anterior cruciate ligament, posterior cruciate ligament reconstruction with posterior lateral corner reconstruction and meniscal root repair on 03/25/2021.  She reports that her pain is controlled.  She has remained nonweightbearing.  She is staying at home by herself with people coming to check on her every day to help with cooking and ADLs.  She had her mother had a fight and she has been kicked out of her mom's house.  They are working on resolving their arguments.  Taking tramadol for pain control.  Taking Xarelto for DVT prophylaxis.  She had concern for UTI toward the end of her hospitalization but she currently is denying any UTI symptoms such as dysuria or increased urinary frequency.  Denies any chest pain, shortness of breath, calf pain.  No abdominal pain.  On exam, patient has 0 degrees extension.  Flexion is limited as she has remained in extension since the procedure.  Flexes to about 20 degrees easily but flexion beyond that is more restricted and difficult to assess due to patient's body habitus.  On examination, patient's ACL, LCL, posterior lateral corner feel intact.  Patient is able to perform straight leg raise.  Incisions are healing well and sutures were removed and replaced with Steri-Strips from the posterior lateral corner incision.  There is a slight scant amount of serosanguineous drainage from the midportion of the posterior lateral corner incision but there is no evidence of wound dehiscence or any significant infection.  Palpable DP pulse of the operative extremity.   Intact ankle dorsiflexion, plantarflexion.  No calf tenderness.  Negative Homans' sign.  Plan is to arrange for either CPM machine or home health physical therapy to start in 1 week to focus on passive flexion.  She will remain nonweightbearing until 6 weeks out from procedure.  She has applied for Medicaid but she does not have this yet.  She is also having dispute from [employer] and has had to hire a lawyer for this dispute as was detailed in earlier note.  Follow-up in 2 weeks for clinical recheck regarding her range of motion and stability.  Follow-Up Instructions: No follow-ups on file.   Orders:  No orders of the defined types were placed in this encounter.  No orders of the defined types were placed in this encounter.   Imaging: No results found.  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    S/P left knee surgery 03/25/2021   Past Medical History:  Diagnosis Date   Anemia    Asthma    Environmental allergies    Hypertension  Family History  Problem Relation Age of Onset   Hypertension Mother    Diabetes Mother    Hypertension Sister     Past Surgical History:  Procedure Laterality Date   ANTERIOR CRUCIATE LIGAMENT REPAIR Left 02/24/2021   KNEE ARTHROSCOPY WITH ANTERIOR CRUCIATE LIGAMENT (ACL) REPAIR WITH HAMSTRING GRAFT Left 03/25/2021   Procedure: left knee anterior cruciate ligament, posterior cruciate ligment, posterolateral corner reconstruction arthroscopy; allograft ligaments for reconstruction, meniscal debridement vs repair;  Surgeon: Meredith Pel, MD;  Location: [place];   Service: Orthopedics;  Laterality: Left;   Social History   Occupational History   Not on file  Tobacco Use   Smoking status: Never   Smokeless tobacco: Never  Vaping Use   Vaping Use: Never used  Substance and Sexual Activity   Alcohol use: Yes    Alcohol/week: 1.0 standard drink    Types: 1 Glasses of wine per week    Comment: occas.   Drug use: No   Sexual activity: Not on file

## 2021-04-19 NOTE — Telephone Encounter (Signed)
She is in a tough position but if she cant have HHPT or CPM then she'll probably just have to work on her flexion by herself.  She is still nonweightbearing on the leg.  She should know some exercises to do already since she had to work on achieving flexion prior to her surgery.  For now she should really just be working on improving her flexion.  However, if she has any questions I'm happy to answer them. We'll have to figure something else out for when she starts weightbearing, but hopefully her medicaid will be approved by then

## 2021-04-21 ENCOUNTER — Encounter: Payer: Self-pay | Admitting: Orthopedic Surgery

## 2021-04-21 ENCOUNTER — Telehealth (HOSPITAL_COMMUNITY): Payer: Self-pay | Admitting: Pharmacy Technician

## 2021-04-21 ENCOUNTER — Other Ambulatory Visit (HOSPITAL_COMMUNITY): Payer: Self-pay

## 2021-04-21 NOTE — Telephone Encounter (Signed)
Pharmacy Transitions of Care Follow-up Telephone Call  Date of discharge: 04/11/2021  Discharge Diagnosis: DVT prophylaxis s/p knee surgery  How have you been since you were released from the hospital?  Alright, incision appears to have come open and some aches and pains with some burning sensation around incision area.  Medication changes made at discharge: START taking: celecoxib (CELEBREX) (decreased to once a day) doxycycline (VIBRA-TABS) (patient finished course 04/20/2021) hydrocerin  melatonin  traMADol (ULTRAM)  CHANGE how you take: methocarbamol (ROBAXIN)  STOP taking: acetaminophen 500 MG tablet (TYLENOL)  cephALEXin 500 MG capsule (KEFLEX)  oxyCODONE 5 MG immediate release tablet (Oxy IR/ROXICODONE)   Medication changes verified by the patient?  Yes Patient was also started on Lopressor (metoprolol tartrate) inpatient    Medication Accessibility:  Home Pharmacy: [facility] on [address] in [place]  Was the patient provided with refills on discharged medications? No   Have all prescriptions been transferred from [facility] to home pharmacy? N/A   Is the patient able to afford medications? Patient was provided medications through [facility] program Notable copays: N/A Eligible patient assistance: N/A    Medication Review:  RIVAROXABAN (XARELTO)  Rivaroxaban 10 mg daily  - Discussed importance of taking medication with food and around the same time everyday  - Reviewed potential DDIs with patient  - Advised patient of medications to avoid (NSAIDs, ASA)  - Educated that Tylenol (acetaminophen) will be the preferred analgesic to prevent risk of bleeding  - Emphasized importance of monitoring for signs and symptoms of bleeding (abnormal bruising, prolonged bleeding, nose bleeds, bleeding from gums, discolored urine, black tarry stools)  - Advised patient to alert all providers of anticoagulation therapy prior to starting a new medication or having a procedure     Follow-up Appointments:  Patient does not have a PCP, recommended she follow up with [facility] and [facility] f/u appt confirmed?  Had f/u on 04/14/2021 with ortho was told to f/u in 2 weeks, needs to call to schedule appointment.  If their condition worsens, is the pt aware to call PCP or go to the Emergency Dept.? Yes  Final Patient Assessment: -Pt is doing well.  -Pt verbalized understanding of Xarelto.  -Pt had post discharge appointment already -No refills to schedule -Recommended [facility] and pharmacy for f/u as patient is uninsured  Parthenia Ames, PharmD

## 2021-04-21 NOTE — Telephone Encounter (Signed)
Looks ok not unexpected

## 2021-04-25 ENCOUNTER — Other Ambulatory Visit: Payer: Self-pay

## 2021-04-25 ENCOUNTER — Ambulatory Visit (INDEPENDENT_AMBULATORY_CARE_PROVIDER_SITE_OTHER): Payer: Self-pay | Admitting: Orthopedic Surgery

## 2021-04-25 ENCOUNTER — Ambulatory Visit: Payer: Medicaid Other | Admitting: Orthopedic Surgery

## 2021-04-25 DIAGNOSIS — S83105A Unspecified dislocation of left knee, initial encounter: Secondary | ICD-10-CM

## 2021-04-25 MED ORDER — SILVER SULFADIAZINE 1 % EX CREA: 1.0000 "application " | TOPICAL_CREAM | Freq: Every day | CUTANEOUS | 0 refills | Status: DC

## 2021-04-29 ENCOUNTER — Ambulatory Visit: Payer: Medicaid Other | Admitting: Internal Medicine

## 2021-04-29 NOTE — Progress Notes (Incomplete)
Cardiology Office Note:    Date:  04/29/2021   ID:  Meagan Oneill, DOB [birth year], MRN [id number]  PCP:  Patient, No Pcp Per (Inactive)   [facility] Providers Cardiologist:  Maisie Fus, MD { Click to update primary MD,subspecialty MD or APP then REFRESH:1}    Referring MD: No ref. provider found   No chief complaint on file. ***  History of Present Illness:    Meagan Oneill is a 31 y.o. female with a hx of asthma, HTN, s/p L knee ACL, s/p reconstruction 03/28/2021 at which time she has persistent sinus tachycardia started on metop 25 BID  Past Medical History:  Diagnosis Date   Anemia    Asthma    Environmental allergies    Hypertension     Past Surgical History:  Procedure Laterality Date   ANTERIOR CRUCIATE LIGAMENT REPAIR Left 02/24/2021   KNEE ARTHROSCOPY WITH ANTERIOR CRUCIATE LIGAMENT (ACL) REPAIR WITH HAMSTRING GRAFT Left 03/25/2021   Procedure: left knee anterior cruciate ligament, posterior cruciate ligment, posterolateral corner reconstruction arthroscopy; allograft ligaments for reconstruction, meniscal debridement vs repair;  Surgeon: Cammy Copa, MD;  Location: [facility] OR;  Service: Orthopedics;  Laterality: Left;    Current Medications: No outpatient medications have been marked as taking for the 04/29/21 encounter (Appointment) with Maisie Fus, MD.     Allergies:   Morphine and related   Social History   Socioeconomic History   Marital status: Single    Spouse name: Not on file   Number of children: Not on file   Years of education: Not on file   Highest education level: Not on file  Occupational History   Not on file  Tobacco Use   Smoking status: Never   Smokeless tobacco: Never  Vaping Use   Vaping Use: Never used  Substance and Sexual Activity   Alcohol use: Yes    Alcohol/week: 1.0 standard drink    Types: 1 Glasses of wine per week    Comment: occas.   Drug use: No   Sexual activity: Not on file  Other Topics Concern   Not  on file  Social History Narrative   Not on file   Social Determinants of Health   Financial Resource Strain: Not on file  Food Insecurity: Not on file  Transportation Needs: Not on file  Physical Activity: Not on file  Stress: Not on file  Social Connections: Not on file     Family History: The patient's ***family history includes Diabetes in her mother; Hypertension in her mother and sister.  ROS:   Please see the history of present illness.    *** All other systems reviewed and are negative.  EKGs/Labs/Other Studies Reviewed:    The following studies were reviewed today: ***  EKG:  EKG is *** ordered today.  The ekg ordered today demonstrates ***  Recent Labs: 04/06/2021: ALT 13; BUN 9; Creatinine, Ser 0.70; Potassium 3.6; Sodium 137 04/08/2021: Hemoglobin 9.1; Platelets 220  Recent Lipid Panel No results found for: CHOL, TRIG, HDL, CHOLHDL, VLDL, LDLCALC, LDLDIRECT   Risk Assessment/Calculations:   {Does this patient have ATRIAL FIBRILLATION?:[phone]}       Physical Exam:    VS:  There were no vitals taken for this visit.    Wt Readings from Last 3 Encounters:  03/30/21 (!) 379 lb 3.1 oz (172 kg)  03/22/21 (!) (P) 379 lb (171.9 kg)  02/16/21 (!) 380 lb (172.4 kg)     GEN: *** Well nourished, well developed  in no acute distress HEENT: Normal NECK: No JVD; No carotid bruits LYMPHATICS: No lymphadenopathy CARDIAC: ***RRR, no murmurs, rubs, gallops RESPIRATORY:  Clear to auscultation without rales, wheezing or rhonchi  ABDOMEN: Soft, non-tender, non-distended MUSCULOSKELETAL:  No edema; No deformity  SKIN: Warm and dry NEUROLOGIC:  Alert and oriented x 3 PSYCHIATRIC:  Normal affect   ASSESSMENT:    No diagnosis found. PLAN:    In order of problems listed above:  ***      {Are you ordering a CV Procedure (e.g. stress test, cath, DCCV, TEE, etc)?   Press F2        :[id number]}    Medication Adjustments/Labs and Tests Ordered: Current  medicines are reviewed at length with the patient today.  Concerns regarding medicines are outlined above.  No orders of the defined types were placed in this encounter.  No orders of the defined types were placed in this encounter.   There are no Patient Instructions on file for this visit.   Signed, Maisie Fus, MD  04/29/2021 8:07 AM    [facility]

## 2021-05-01 ENCOUNTER — Encounter: Payer: Self-pay | Admitting: Orthopedic Surgery

## 2021-05-01 NOTE — Progress Notes (Signed)
Post-Op Visit Note   Patient: Meagan Oneill           Date of Birth: 1990/12/16           MRN: [id number] Visit Date: 04/25/2021 PCP: Patient, No Pcp Per (Inactive)   Assessment & Plan:  Chief Complaint:  Chief Complaint  Patient presents with   Other     Check incision   Visit Diagnoses:  1. Left knee dislocation, initial encounter     Plan: Patient is a 31 year old female who presents s/p left knee multi ligament reconstruction with ACL, PCL, posterior lateral corner, meniscal root repair on 03/25/2021.  She is here today for incision recheck.  All of her incisions are healing well except for the midportion of the lateral posterior lateral corner incision which has opened up in the midportion.  There is not a lot of surrounding erythema.  No active or expressible drainage from the incision site.  The incision does not probe to bone though does probe about a centimeter below the visible wound depth.  She feels well and does not feel sick or ill.  On exam, she has 0 degrees extension.  No calf tenderness.  Negative Homans' sign.  She is able to perform multiple straight leg raises quite well with no extensor lag.  She has no significant laxity of the posterior lateral corner with rotational stressing and varus stressing.  ACL and PCL graft feels stable as well.  There is no knee effusion noted.  Plan is to apply Silvadene to the wound bed with placement of dry dressings that she will change daily or semi-daily.  She will keep a close eye on this for any increased dehiscence, drainage, redness, other signs/symptoms of infection that were discussed at her visit today.  If she notices any of these, she will call the office or send a message on MyChart.  She will follow-up on 05/04/2021 for clinical recheck.  A phone call was attempted to see how she was doing on 05/01/2021 but she was unable to be reached so a voicemail was left.  She will start knee range of motion today to focus on passive and  active flexion/extension of the left knee.  No full weightbearing but she is okay to do touchdown weightbearing for transfers.  Follow-Up Instructions: No follow-ups on file.   Orders:  No orders of the defined types were placed in this encounter.  Meds ordered this encounter  Medications   silver sulfADIAZINE (SILVADENE) 1 % cream    Sig: Apply 1 application topically daily.    Dispense:  400 g    Refill:  0    Imaging: No results found.  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    S/P left knee surgery 03/25/2021   Past Medical History:  Diagnosis Date   Anemia    Asthma    Environmental allergies    Hypertension     Family History  Problem Relation Age of Onset   Hypertension Mother    Diabetes Mother    Hypertension Sister     Past Surgical  History:  Procedure Laterality Date   ANTERIOR CRUCIATE LIGAMENT REPAIR Left 02/24/2021   KNEE ARTHROSCOPY WITH ANTERIOR CRUCIATE LIGAMENT (ACL) REPAIR WITH HAMSTRING GRAFT Left 03/25/2021   Procedure: left knee anterior cruciate ligament, posterior cruciate ligment, posterolateral corner reconstruction arthroscopy; allograft ligaments for reconstruction, meniscal debridement vs repair;  Surgeon: Cammy Copa, MD;  Location: [facility];  Service: Orthopedics;  Laterality: Left;   Social History   Occupational History   Not on file  Tobacco Use   Smoking status: Never   Smokeless tobacco: Never  Vaping Use   Vaping Use: Never used  Substance and Sexual Activity   Alcohol use: Yes    Alcohol/week: 1.0 standard drink    Types: 1 Glasses of wine per  week    Comment: occas.   Drug use: No   Sexual activity: Not on file

## 2021-05-04 ENCOUNTER — Other Ambulatory Visit: Payer: Self-pay

## 2021-05-04 ENCOUNTER — Ambulatory Visit (INDEPENDENT_AMBULATORY_CARE_PROVIDER_SITE_OTHER): Payer: Self-pay | Admitting: Orthopedic Surgery

## 2021-05-04 ENCOUNTER — Encounter: Payer: Self-pay | Admitting: Orthopedic Surgery

## 2021-05-04 DIAGNOSIS — Z9889 Other specified postprocedural states: Secondary | ICD-10-CM

## 2021-05-08 ENCOUNTER — Encounter: Payer: Self-pay | Admitting: Orthopedic Surgery

## 2021-05-08 NOTE — Progress Notes (Signed)
Post-Op Visit Note   Patient: Meagan Oneill           Date of Birth: 1990/06/14           MRN: [id number] Visit Date: 05/04/2021 PCP: Patient, No Pcp Per (Inactive)   Assessment & Plan:  Chief Complaint:  Chief Complaint  Patient presents with   Left Knee - Routine Post Op   Visit Diagnoses:  1. S/P reconstruction of ligament of knee     Plan: Patient is a 31 year old female who presents s/p left knee anterior cruciate ligament, posterior cruciate ligament, posterior lateral corner reconstruction with meniscal root repair on 03/25/2021.  Taking Xarelto for DVT prophylaxis.  She feels her knee is improving.  Only taking Celebrex for pain daily.  Using a knee brace when she ambulates.  She is 50% partial weightbearing with knee immobilizer and walker.  Denies any fevers, chills, night sweats.  She has had dehiscence of the lateral incision in the midportion of the incision that is being followed and she has been placing Silvadene in the wound and packing with gauze with dry dressing overlying.  She is changing this daily.  On exam, she has 0 degrees extension and 70 degrees of knee flexion.  Able to perform straight leg raise without difficulty.  Intact ankle dorsiflexion and plantarflexion.  Very minimal calf tenderness.  Negative Homans' sign.  ACL and PCL are stable to anterior/posterior drawer.  LCL stable to varus stressing.  The lateral wound looks to be about the same depth but does probe less deep compared with last exam.  There is no expressible drainage with milking of the lateral thigh and calf up toward the incision.  Granulation tissue present in the wound bed with no odor noted.  Biologic residue in wound bed. No knee effusion noted..  Patient has no significant tenderness or pain with passive motion of the operative knee.  She has no real pain with weightbearing.    Plan is to place wound VAC into the lateral wound with follow-up with Dr. Sharol Given next week for his evaluation of the  wound.  Ultrasound ordered to rule out DVT, if this is negative, transition to aspirin from Xarelto.  She can progress to full weightbearing with walker and knee immobilizer for balance.  She will see Dr. Sharol Given early next week and then follow-up with me or Dr. Marlou Sa on that Friday of next week.       Follow-Up Instructions: No follow-ups on file.   Orders:  Orders Placed This Encounter  Procedures   VAS [place] LOWER EXTREMITY VENOUS (DVT)   No orders of the defined types were placed in this encounter.   Imaging: No results found.  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    S/P left knee surgery 03/25/2021   Past Medical History:  Diagnosis Date   Anemia    Asthma    Environmental allergies    Hypertension     Family History  Problem Relation Age of Onset   Hypertension Mother    Diabetes Mother    Hypertension Sister  Past Surgical History:  Procedure Laterality Date   ANTERIOR CRUCIATE LIGAMENT REPAIR Left 02/24/2021   KNEE ARTHROSCOPY WITH ANTERIOR CRUCIATE LIGAMENT (ACL) REPAIR WITH HAMSTRING GRAFT Left 03/25/2021   Procedure: left knee anterior cruciate ligament, posterior cruciate ligment, posterolateral corner reconstruction arthroscopy; allograft ligaments for reconstruction, meniscal debridement vs repair;  Surgeon: Meredith Pel, MD;  Location: [place];  Service: Orthopedics;  Laterality: Left;   Social History   Occupational History   Not on file  Tobacco Use   Smoking status: Never   Smokeless tobacco: Never  Vaping Use   Vaping Use: Never used  Substance and Sexual Activity    Alcohol use: Yes    Alcohol/week: 1.0 standard drink    Types: 1 Glasses of wine per week    Comment: occas.   Drug use: No   Sexual activity: Not on file

## 2021-05-10 ENCOUNTER — Other Ambulatory Visit: Payer: Self-pay

## 2021-05-10 ENCOUNTER — Ambulatory Visit (INDEPENDENT_AMBULATORY_CARE_PROVIDER_SITE_OTHER): Payer: Worker's Compensation | Admitting: Orthopedic Surgery

## 2021-05-10 ENCOUNTER — Encounter: Payer: Self-pay | Admitting: Orthopedic Surgery

## 2021-05-10 DIAGNOSIS — S81002S Unspecified open wound, left knee, sequela: Secondary | ICD-10-CM | POA: Diagnosis not present

## 2021-05-10 DIAGNOSIS — Z9889 Other specified postprocedural states: Secondary | ICD-10-CM | POA: Diagnosis not present

## 2021-05-10 NOTE — Progress Notes (Signed)
Office Visit Note   Patient: Meagan Oneill           Date of Birth: 14-Mar-1991           MRN: [id number] Visit Date: 05/10/2021              Requested by: No referring provider defined for this encounter. PCP: Patient, No Pcp Per (Inactive)  No chief complaint on file.     HPI: Patient is a 31 year old woman who was seen for initial evaluation status post multi ligament reconstruction left knee.  Patient has had progressive dehiscence of a lateral wound.  She has undergone excellent conservative therapy including a most recent wound VAC.  Assessment & Plan: Visit Diagnoses:  1. S/P reconstruction of ligament of knee   2. Open knee wound, left, sequela     Plan: With the open wound and persistent necrotic adipose tissue I recommended proceeding with excision of the necrotic tissue application of tissue graft substitute wound closure and application of incisional wound VAC.  Recommended vitamin D3 5000 international units a day omega-3 2000 mg a day vitamin C 2000 mg a day and a protein supplement 2 times a day.  Follow-Up Instructions: Return in about 1 week (around 05/17/2021).   Ortho Exam  Patient is alert, oriented, no adenopathy, well-dressed, normal affect, normal respiratory effort. Examination of the wound VAC is removed there is approximately 50% healthy granulation tissue within the wound bed the remaining tissue is necrotic adipose tissue I cannot further debride this tissue in the office, there is no ascending cellulitis there is no abscess.  Imaging: No results found.   Labs: Lab Results  Component Value Date   REPTSTATUS 04/09/2021 FINAL 04/08/2021   CULT (A) 04/08/2021    <10,000 COLONIES/mL INSIGNIFICANT GROWTH Performed at [facility] [address], [place], [place] [address]      Lab Results  Component Value Date   ALBUMIN 2.6 (L) 04/06/2021   ALBUMIN 2.5 (L) 03/29/2021   ALBUMIN 3.2 (L) 10/21/2018    No results found for: MG No results  found for: VD25OH  No results found for: PREALBUMIN CBC EXTENDED Latest Ref Rng & Units 04/08/2021 04/06/2021 03/29/2021  WBC 4.0 - 10.5 K/uL 8.4 9.0 10.4  RBC 3.87 - 5.11 MIL/uL 4.44 4.47 4.84  HGB 12.0 - 15.0 g/dL 9.1(L) 9.1(L) 9.8(L)  HCT 36.0 - 46.0 % 29.6(L) 29.9(L) 32.6(L)  PLT 150 - 400 K/uL 220 230 241  NEUTROABS 1.7 - 7.7 K/uL - 5.3 7.0  LYMPHSABS 0.7 - 4.0 K/uL - 3.0 2.6     There is no height or weight on file to calculate BMI.  Orders:  No orders of the defined types were placed in this encounter.  No orders of the defined types were placed in this encounter.    Procedures: No procedures performed  Clinical Data: No additional findings.  ROS:  All other systems negative, except as noted in the HPI. Review of Systems  Objective: Vital Signs: There were no vitals taken for this visit.  Specialty Comments:  No specialty comments available.  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    S/P left knee surgery 03/25/2021   Past Medical History:  Diagnosis Date   Anemia    Asthma    Environmental allergies    Hypertension     Family History  Problem Relation Age of Onset   Hypertension Mother    Diabetes Mother    Hypertension Sister     Past Surgical History:  Procedure Laterality Date   ANTERIOR CRUCIATE LIGAMENT REPAIR Left 02/24/2021   KNEE ARTHROSCOPY WITH ANTERIOR CRUCIATE LIGAMENT (ACL) REPAIR WITH HAMSTRING GRAFT Left 03/25/2021   Procedure: left knee anterior cruciate ligament, posterior cruciate ligment, posterolateral corner reconstruction arthroscopy; allograft  ligaments for reconstruction, meniscal debridement vs repair;  Surgeon: Meredith Pel, MD;  Location: [facility];  Service: Orthopedics;  Laterality: Left;   Social History   Occupational History   Not on file  Tobacco Use   Smoking status: Never   Smokeless tobacco: Never  Vaping Use   Vaping Use: Never used  Substance and Sexual Activity   Alcohol use: Yes    Alcohol/week: 1.0 standard drink    Types: 1 Glasses of wine per week    Comment: occas.   Drug use: No   Sexual activity: Not on file

## 2021-05-11 ENCOUNTER — Encounter (HOSPITAL_COMMUNITY): Payer: Self-pay | Admitting: Orthopedic Surgery

## 2021-05-11 ENCOUNTER — Other Ambulatory Visit: Payer: Self-pay | Admitting: Family

## 2021-05-11 ENCOUNTER — Other Ambulatory Visit: Payer: Self-pay

## 2021-05-11 NOTE — Progress Notes (Signed)
DUE TO COVID-19 ONLY ONE VISITOR IS ALLOWED TO COME WITH YOU AND STAY IN THE WAITING ROOM ONLY DURING PRE OP AND PROCEDURE DAY OF SURGERY.   PCP - none Cardiologist - n/a  Chest x-ray - n/a EKG - 03/28/21 Stress Test - n/a ECHO - n/a Cardiac Cath - n/a  ICD Pacemaker/Loop - n/a  Sleep Study -  n/a CPAP - none  Blood Thinner Instructions:  Follow your surgeon's instructions on when to stop Xarelto prior to surgery.  No instructions were given.  I called Baird Lyons at MD's office for those instructions, got voicemail.  LMOM for Malachy Mood to call patient with Xarelto instructions for surgery.    ERAS: Clear liquids til 9 am DOS.  Anesthesia review: Yes  STOP now taking any Aspirin (unless otherwise instructed by your surgeon), Aleve, Naproxen, Ibuprofen, Motrin, Advil, Goody's, BC's, all herbal medications, fish oil, and all vitamins.   Coronavirus Screening Covid test n/a Ambulatory Surgery  Do you have any of the following symptoms:  Cough yes/no: No Fever (>100.57F)  yes/no: No Runny nose yes/no: No Sore throat yes/no: No Difficulty breathing/shortness of breath  yes/no: No  Have you traveled in the last 14 days and where? yes/no: No  Patient verbalized understanding of instructions that were given via phone.

## 2021-05-12 NOTE — Anesthesia Preprocedure Evaluation (Addendum)
Anesthesia Evaluation  Patient identified by MRN, date of birth, ID band Patient awake    Reviewed: Allergy & Precautions, NPO status , Patient's Chart, lab work & pertinent test results  Airway Mallampati: II  TM Distance: >3 FB Neck ROM: Full    Dental no notable dental hx. (+) Teeth Intact, Dental Advisory Given   Pulmonary asthma ,    Pulmonary exam normal breath sounds clear to auscultation       Cardiovascular hypertension, + DVT (on xarelto)  Normal cardiovascular exam Rhythm:Regular Rate:Normal     Neuro/Psych  Neuromuscular disease    GI/Hepatic Neg liver ROS,   Endo/Other  negative endocrine ROS  Renal/GU Lab Results      Component                Value               Date                      CREATININE               0.70                04/06/2021                BUN                      9                   04/06/2021                NA                       137                 04/06/2021                K                        3.6                 04/06/2021                CL                       104                 04/06/2021                CO2                      27                  04/06/2021                Musculoskeletal negative musculoskeletal ROS (+)   Abdominal   Peds  Hematology  (+) anemia , Lab Results      Component                Value               Date                      WBC  8.4                 04/08/2021                HGB                      9.1 (L)             04/08/2021                HCT                      29.6 (L)            04/08/2021                MCV                      66.7 (L)            04/08/2021                PLT                      220                 04/08/2021              Anesthesia Other Findings   Reproductive/Obstetrics                           Anesthesia Physical Anesthesia Plan  ASA: 3  Anesthesia Plan:  General   Post-op Pain Management: Minimal or no pain anticipated   Induction: Intravenous  PONV Risk Score and Plan: 3 and 4 or greater and Treatment may vary due to age or medical condition, Ondansetron, Dexamethasone and Midazolam  Airway Management Planned: Oral ETT and LMA  Additional Equipment: None  Intra-op Plan:   Post-operative Plan: Extubation in OR  Informed Consent: I have reviewed the patients History and Physical, chart, labs and discussed the procedure including the risks, benefits and alternatives for the proposed anesthesia with the patient or authorized representative who has indicated his/her understanding and acceptance.     Dental advisory given  Plan Discussed with: CRNA and Anesthesiologist  Anesthesia Plan Comments: (PAT note written 05/12/2021 by Myra Gianotti, PA-C. )      Anesthesia Quick Evaluation

## 2021-05-12 NOTE — Progress Notes (Signed)
Anesthesia Chart Review: Meagan Oneill  Case: [id number] Date/Time: 05/13/21 1151   Procedure: DEBRIDEMENT LEFT KNEE (Left)   Anesthesia type: Choice   Pre-op diagnosis: Wound Left Knee   Location: [facility] OR ROOM 05 / [facility]   Surgeons: Newt Minion, MD       DISCUSSION: Patient is a 31 year old female scheduled for the above procedure.    History includes never smoker, asthma, HTN, anemia, morbid obesity.   Fall with left knee dislocation on 02/14/21, s/p arthroscopic left knee anterior cruciate ligament, posterior cruciate ligament reconstruction with posterior lateral corner reconstruction and meniscal root repair on 03/25/2021. She has developed progressive dehiscence of lateral wound and has been receiving local wound care and wound VAC dressing changes.  Despite wound care she has had persistent necrotic adipose tissue with approximately 50% healthy granulation tissue.  Further debridement in the operating room recommended.  She is on Xarelto for DVT prophylaxis following knee surgery. I communicated with Dr. Sharol Given. His staff will let patient know if she needs to hold for surgery.   Anesthesia team to evaluate on the day of surgery.   VS: Ht 5\' 3"  (1.6 m)    Wt (!) 171.9 kg    LMP 05/06/2021 (Approximate)    BMI 67.14 kg/m    PROVIDERS: Patient, No Pcp Per (Inactive)   LABS: For day of surgery as indicated. Last results include: Lab Results  Component Value Date   WBC 8.4 04/08/2021   HGB 9.1 (L) 04/08/2021   HCT 29.6 (L) 04/08/2021   PLT 220 04/08/2021   GLUCOSE 103 (H) 04/06/2021   ALT 13 04/06/2021   AST 13 (L) 04/06/2021   NA 137 04/06/2021   K 3.6 04/06/2021   CL 104 04/06/2021   CREATININE 0.70 04/06/2021   BUN 9 04/06/2021   CO2 27 04/06/2021     IMAGES: CTA Abd Aorta with ileofemoral runoff 02/16/21: IMPRESSION: VASCULAR Evaluation of arterial structures limited due to suboptimal opacification related to patient body habitus and timing of contrast bolus.  No definite injury of the popliteal artery is identified. If there is continued clinical suspicion for popliteal artery injury, further evaluation with ultrasound should be performed.   NON-VASCULAR Soft tissue swelling of the left knee again noted.    EKG:04/29/20: ST at 115 bpm   CV: BLE Venous US 04/06/21: Summary:  RIGHT:  - There is no evidence of deep vein thrombosis in the lower extremity.  However, portions of this examination were limited- see technologist  comments above.  - No cystic structure found in the popliteal fossa.     LEFT:  - There is no evidence of deep vein thrombosis in the lower extremity.  However, portions of this examination were limited- see technologist  comments above.  - No cystic structure found in the popliteal fossa.      Past Medical History:  Diagnosis Date   Anemia    Asthma    no inhaler   Environmental allergies    Hypertension     Past Surgical History:  Procedure Laterality Date   KNEE ARTHROSCOPY WITH ANTERIOR CRUCIATE LIGAMENT (ACL) REPAIR WITH HAMSTRING GRAFT Left 03/25/2021   Procedure: left knee anterior cruciate ligament, posterior cruciate ligment, posterolateral corner reconstruction arthroscopy; allograft ligaments for reconstruction, meniscal debridement vs repair;  Surgeon: Meredith Pel, MD;  Location: [facility];  Service: Orthopedics;  Laterality: Left;   WISDOM TOOTH EXTRACTION      MEDICATIONS: No current facility-administered medications for this encounter.  rivaroxaban (XARELTO) 10 MG TABS tablet   celecoxib (CELEBREX) 200 MG capsule   docusate sodium (COLACE) 100 MG capsule   doxycycline (VIBRA-TABS) 100 MG tablet   hydrocerin (EUCERIN) CREA   melatonin 3 MG TABS tablet   methocarbamol (ROBAXIN) 500 MG tablet   metoprolol tartrate (LOPRESSOR) 25 MG tablet   polyethylene glycol (MIRALAX / GLYCOLAX) 17 g packet   silver sulfADIAZINE (SILVADENE) 1 % cream   traMADol (ULTRAM) 50 MG tablet    Myra Gianotti, PA-C Surgical Short Stay/Anesthesiology [facility] Phone [phone] [facility] Phone [phone] 05/12/2021 12:09 PM

## 2021-05-13 ENCOUNTER — Other Ambulatory Visit: Payer: Self-pay

## 2021-05-13 ENCOUNTER — Ambulatory Visit (HOSPITAL_COMMUNITY)
Admission: RE | Admit: 2021-05-13 | Discharge: 2021-05-13 | Disposition: A | Discharge status: Home / Self Care | Payer: Self-pay | Attending: Orthopedic Surgery | Admitting: Orthopedic Surgery

## 2021-05-13 ENCOUNTER — Encounter (HOSPITAL_COMMUNITY)
Admission: RE | Disposition: A | Discharge status: Home / Self Care | Payer: Self-pay | Source: Home / Self Care | Attending: Orthopedic Surgery

## 2021-05-13 ENCOUNTER — Encounter (HOSPITAL_COMMUNITY): Payer: Self-pay | Admitting: Orthopedic Surgery

## 2021-05-13 ENCOUNTER — Ambulatory Visit (HOSPITAL_COMMUNITY): Payer: Self-pay | Admitting: Vascular Surgery

## 2021-05-13 ENCOUNTER — Encounter: Payer: Medicaid Other | Admitting: Surgical

## 2021-05-13 DIAGNOSIS — T8131XS Disruption of external operation (surgical) wound, not elsewhere classified, sequela: Secondary | ICD-10-CM

## 2021-05-13 DIAGNOSIS — X58XXXA Exposure to other specified factors, initial encounter: Secondary | ICD-10-CM | POA: Diagnosis not present

## 2021-05-13 DIAGNOSIS — T8130XA Disruption of wound, unspecified, initial encounter: Secondary | ICD-10-CM | POA: Diagnosis not present

## 2021-05-13 DIAGNOSIS — S81002A Unspecified open wound, left knee, initial encounter: Secondary | ICD-10-CM | POA: Diagnosis not present

## 2021-05-13 DIAGNOSIS — S83105S Unspecified dislocation of left knee, sequela: Secondary | ICD-10-CM

## 2021-05-13 DIAGNOSIS — S81002S Unspecified open wound, left knee, sequela: Secondary | ICD-10-CM

## 2021-05-13 HISTORY — PX: I & D EXTREMITY: SHX5045

## 2021-05-13 LAB — CBC
HCT: 36.4 % (ref 36.0–46.0)
Hemoglobin: 11.1 g/dL — ABNORMAL LOW (ref 12.0–15.0)
MCH: 20.4 pg — ABNORMAL LOW (ref 26.0–34.0)
MCHC: 30.5 g/dL (ref 30.0–36.0)
MCV: 66.8 fL — ABNORMAL LOW (ref 80.0–100.0)
Platelets: 233 10*3/uL (ref 150–400)
RBC: 5.45 MIL/uL — ABNORMAL HIGH (ref 3.87–5.11)
RDW: 21 % — ABNORMAL HIGH (ref 11.5–15.5)
WBC: 9.2 10*3/uL (ref 4.0–10.5)
nRBC: 0 % (ref 0.0–0.2)

## 2021-05-13 LAB — BASIC METABOLIC PANEL
Anion gap: 9 (ref 5–15)
BUN: 6 mg/dL (ref 6–20)
CO2: 23 mmol/L (ref 22–32)
Calcium: 9.1 mg/dL (ref 8.9–10.3)
Chloride: 104 mmol/L (ref 98–111)
Creatinine, Ser: 0.76 mg/dL (ref 0.44–1.00)
GFR, Estimated: >60 mL/min (ref >60)
Glucose, Bld: 109 mg/dL — ABNORMAL HIGH (ref 70–99)
Potassium: 3.4 mmol/L — ABNORMAL LOW (ref 3.5–5.1)
Sodium: 136 mmol/L (ref 135–145)

## 2021-05-13 LAB — POCT PREGNANCY, URINE: Preg Test, Ur: NEGATIVE

## 2021-05-13 SURGERY — IRRIGATION AND DEBRIDEMENT EXTREMITY
Anesthesia: General | Site: Knee | Laterality: Left

## 2021-05-13 MED ORDER — METOPROLOL TARTRATE 12.5 MG HALF TABLET
25.0000 mg | ORAL_TABLET | Freq: Once | ORAL | Status: AC
  Administered 2021-05-13: 25 mg via ORAL

## 2021-05-13 MED ORDER — ONDANSETRON HCL 4 MG/2ML IJ SOLN
INTRAMUSCULAR | Status: AC
  Filled 2021-05-13: qty 2

## 2021-05-13 MED ORDER — LACTATED RINGERS IV SOLN: INTRAVENOUS | Status: DC

## 2021-05-13 MED ORDER — KETOROLAC TROMETHAMINE 30 MG/ML IJ SOLN
INTRAMUSCULAR | Status: DC | PRN
  Administered 2021-05-13: 30 mg via INTRAVENOUS

## 2021-05-13 MED ORDER — METOPROLOL TARTRATE 12.5 MG HALF TABLET
ORAL_TABLET | ORAL | Status: AC
  Filled 2021-05-13: qty 2

## 2021-05-13 MED ORDER — FENTANYL CITRATE (PF) 250 MCG/5ML IJ SOLN
INTRAMUSCULAR | Status: AC
  Filled 2021-05-13: qty 5

## 2021-05-13 MED ORDER — PROPOFOL 10 MG/ML IV BOLUS
INTRAVENOUS | Status: AC
  Filled 2021-05-13: qty 20

## 2021-05-13 MED ORDER — MIDAZOLAM HCL 2 MG/2ML IJ SOLN
INTRAMUSCULAR | Status: AC
  Filled 2021-05-13: qty 2

## 2021-05-13 MED ORDER — OXYCODONE HCL 5 MG/5ML PO SOLN: 5.0000 mg | Freq: Once | ORAL | Status: DC | PRN

## 2021-05-13 MED ORDER — ONDANSETRON HCL 4 MG/2ML IJ SOLN
INTRAMUSCULAR | Status: DC | PRN
Start: 2021-05-13 — End: 2021-05-13
  Administered 2021-05-13: 4 mg via INTRAVENOUS

## 2021-05-13 MED ORDER — FENTANYL CITRATE (PF) 100 MCG/2ML IJ SOLN
INTRAMUSCULAR | Status: DC | PRN
  Administered 2021-05-13 (×2): 50 ug via INTRAVENOUS

## 2021-05-13 MED ORDER — OXYCODONE-ACETAMINOPHEN 5-325 MG PO TABS: 1.0000 | ORAL_TABLET | ORAL | 0 refills | Status: DC | PRN

## 2021-05-13 MED ORDER — 0.9 % SODIUM CHLORIDE (POUR BTL) OPTIME
TOPICAL | Status: DC | PRN
Start: 2021-05-13 — End: 2021-05-13
  Administered 2021-05-13: 1000 mL

## 2021-05-13 MED ORDER — HYDROMORPHONE HCL 1 MG/ML IJ SOLN
0.2500 mg | INTRAMUSCULAR | Status: DC | PRN
  Administered 2021-05-13: 0.5 mg via INTRAVENOUS

## 2021-05-13 MED ORDER — PROPOFOL 10 MG/ML IV BOLUS
INTRAVENOUS | Status: DC | PRN
  Administered 2021-05-13: 200 mg via INTRAVENOUS

## 2021-05-13 MED ORDER — KETOROLAC TROMETHAMINE 30 MG/ML IJ SOLN
INTRAMUSCULAR | Status: AC
  Filled 2021-05-13: qty 1

## 2021-05-13 MED ORDER — MIDAZOLAM HCL 5 MG/5ML IJ SOLN
INTRAMUSCULAR | Status: DC | PRN
  Administered 2021-05-13: 2 mg via INTRAVENOUS

## 2021-05-13 MED ORDER — CHLORHEXIDINE GLUCONATE 0.12 % MT SOLN
OROMUCOSAL | Status: AC
  Administered 2021-05-13: 15 mL via OROMUCOSAL
  Filled 2021-05-13: qty 15

## 2021-05-13 MED ORDER — ONDANSETRON HCL 4 MG/2ML IJ SOLN: 4.0000 mg | Freq: Once | INTRAMUSCULAR | Status: DC | PRN

## 2021-05-13 MED ORDER — DEXAMETHASONE SODIUM PHOSPHATE 10 MG/ML IJ SOLN
INTRAMUSCULAR | Status: DC | PRN
Start: 2021-05-13 — End: 2021-05-13
  Administered 2021-05-13: 10 mg via INTRAVENOUS

## 2021-05-13 MED ORDER — CEFAZOLIN IN SODIUM CHLORIDE 3-0.9 GM/100ML-% IV SOLN
INTRAVENOUS | Status: AC
  Filled 2021-05-13: qty 100

## 2021-05-13 MED ORDER — DEXMEDETOMIDINE (PRECEDEX) IN NS 20 MCG/5ML (4 MCG/ML) IV SYRINGE
PREFILLED_SYRINGE | INTRAVENOUS | Status: DC | PRN
  Administered 2021-05-13: 8 ug via INTRAVENOUS

## 2021-05-13 MED ORDER — HYDROMORPHONE HCL 1 MG/ML IJ SOLN
INTRAMUSCULAR | Status: AC
  Filled 2021-05-13: qty 1

## 2021-05-13 MED ORDER — DEXAMETHASONE SODIUM PHOSPHATE 10 MG/ML IJ SOLN
INTRAMUSCULAR | Status: AC
  Filled 2021-05-13: qty 1

## 2021-05-13 MED ORDER — CEFAZOLIN IN SODIUM CHLORIDE 3-0.9 GM/100ML-% IV SOLN
3.0000 g | INTRAVENOUS | Status: AC
  Administered 2021-05-13: 3 g via INTRAVENOUS

## 2021-05-13 MED ORDER — AMISULPRIDE (ANTIEMETIC) 5 MG/2ML IV SOLN: 10.0000 mg | Freq: Once | INTRAVENOUS | Status: DC | PRN

## 2021-05-13 MED ORDER — CHLORHEXIDINE GLUCONATE 0.12 % MT SOLN: 15.0000 mL | Freq: Once | OROMUCOSAL | Status: AC

## 2021-05-13 MED ORDER — OXYCODONE HCL 5 MG PO TABS: 5.0000 mg | ORAL_TABLET | Freq: Once | ORAL | Status: DC | PRN

## 2021-05-13 MED ORDER — ACETAMINOPHEN 10 MG/ML IV SOLN: 1000.0000 mg | Freq: Once | INTRAVENOUS | Status: DC | PRN

## 2021-05-13 MED ORDER — ORAL CARE MOUTH RINSE: 15.0000 mL | Freq: Once | OROMUCOSAL | Status: AC

## 2021-05-13 MED ORDER — LIDOCAINE 2% (20 MG/ML) 5 ML SYRINGE
INTRAMUSCULAR | Status: DC | PRN
  Administered 2021-05-13: 100 mg via INTRAVENOUS

## 2021-05-13 SURGICAL SUPPLY — 37 items
BAG COUNTER SPONGE SURGICOUNT (BAG) IMPLANT
BLADE SURG 21 STRL SS (BLADE) ×2 IMPLANT
BNDG COHESIVE 6X5 TAN NS LF (GAUZE/BANDAGES/DRESSINGS) ×1 IMPLANT
BNDG COHESIVE 6X5 TAN STRL LF (GAUZE/BANDAGES/DRESSINGS) IMPLANT
BNDG GAUZE ELAST 4 BULKY (GAUZE/BANDAGES/DRESSINGS) ×4 IMPLANT
COVER SURGICAL LIGHT HANDLE (MISCELLANEOUS) ×4 IMPLANT
DRAPE DERMATAC (DRAPES) ×2 IMPLANT
DRAPE U-SHAPE 47X51 STRL (DRAPES) ×2 IMPLANT
DRSG ADAPTIC 3X8 NADH LF (GAUZE/BANDAGES/DRESSINGS) ×2 IMPLANT
DURAPREP 26ML APPLICATOR (WOUND CARE) ×2 IMPLANT
ELECT REM PT RETURN 9FT ADLT (ELECTROSURGICAL)
ELECTRODE REM PT RTRN 9FT ADLT (ELECTROSURGICAL) IMPLANT
GAUZE SPONGE 4X4 12PLY STRL (GAUZE/BANDAGES/DRESSINGS) ×2 IMPLANT
GLOVE SURG ORTHO LTX SZ9 (GLOVE) ×2 IMPLANT
GLOVE SURG UNDER POLY LF SZ9 (GLOVE) ×2 IMPLANT
GOWN STRL REUS W/ TWL XL LVL3 (GOWN DISPOSABLE) ×2 IMPLANT
GOWN STRL REUS W/TWL XL LVL3 (GOWN DISPOSABLE) ×2
HANDPIECE INTERPULSE COAX TIP (DISPOSABLE)
IMMOBILIZER KNEE 20 (SOFTGOODS) ×2
IMMOBILIZER KNEE 20 THIGH 36 (SOFTGOODS) IMPLANT
KIT BASIN OR (CUSTOM PROCEDURE TRAY) ×2 IMPLANT
KIT PREVENA INCISION MGT 13 (CANNISTER) ×1 IMPLANT
KIT TURNOVER KIT B (KITS) ×2 IMPLANT
MANIFOLD NEPTUNE II (INSTRUMENTS) ×2 IMPLANT
MICROMATRIX 500MG (Tissue) ×2 IMPLANT
NS IRRIG 1000ML POUR BTL (IV SOLUTION) ×2 IMPLANT
PACK ORTHO EXTREMITY (CUSTOM PROCEDURE TRAY) ×2 IMPLANT
PAD ARMBOARD 7.5X6 YLW CONV (MISCELLANEOUS) ×4 IMPLANT
SET HNDPC FAN SPRY TIP SCT (DISPOSABLE) IMPLANT
SOLUTION PARTIC MCRMTRX 500MG (Tissue) IMPLANT
STOCKINETTE IMPERVIOUS 9X36 MD (GAUZE/BANDAGES/DRESSINGS) IMPLANT
SUT ETHILON 2 0 PSLX (SUTURE) ×3 IMPLANT
SWAB COLLECTION DEVICE MRSA (MISCELLANEOUS) ×2 IMPLANT
SWAB CULTURE ESWAB REG 1ML (MISCELLANEOUS) IMPLANT
TOWEL GREEN STERILE (TOWEL DISPOSABLE) ×2 IMPLANT
TUBE CONNECTING 12X1/4 (SUCTIONS) ×2 IMPLANT
YANKAUER SUCT BULB TIP NO VENT (SUCTIONS) ×2 IMPLANT

## 2021-05-13 NOTE — Op Note (Addendum)
05/13/2021  12:46 PM  PATIENT:  Meagan Oneill    PRE-OPERATIVE DIAGNOSIS: Incisional dehiscence, wound Left Knee  POST-OPERATIVE DIAGNOSIS:  Same  PROCEDURE:  DEBRIDEMENT LEFT KNEE wound with local tissue rearrangement 10 x 5 cm. Application of a 13 cm Prevena wound VAC.  SURGEON:  Newt Minion, MD  PHYSICIAN ASSISTANT:None ANESTHESIA:   General  PREOPERATIVE INDICATIONS:  Meagan Oneill is a  31 y.o. female with a diagnosis of Wound Left Knee who failed conservative measures and elected for surgical management.    The risks benefits and alternatives were discussed with the patient preoperatively including but not limited to the risks of infection, bleeding, nerve injury, cardiopulmonary complications, the need for revision surgery, among others, and the patient was willing to proceed.  OPERATIVE IMPLANTS: Praveena wound VAC and ACell powder 500 mg  @ENCIMAGES @  OPERATIVE FINDINGS: Tissue margins were resected back to healthy viable tissue.  No signs of deep abscess tissue margins were clear  OPERATIVE PROCEDURE: Patient was brought the operating room and underwent a general anesthetic.  After adequate levels anesthesia were obtained patient's left lower extremity was prepped using DuraPrep draped into a sterile field a timeout was called.  The wound dehisced ulcer was ellipsed out and this left a wound that was 5 x 10 cm.  Electrocardio is used for hemostasis the wound was irrigated with normal saline the tissue margins were clear.  500 mg of ACell powder applied to the wound.  Local tissue rearrangement was used to close the wound 10 x 5 cm.  This was covered with a 13 cm wound VAC outlined with derma tack covered with Covan this had a good suction fit patient was placed back in a knee immobilizer extubated taken PACU in stable condition.  Debridement type: Excisional Debridement  Side: left  Body Location: knee   Tools used for debridement: scalpel and rongeur  Pre-debridement  Wound size (cm):   Length: 3        Width: 3     Depth: 1   Post-debridement Wound size (cm):   Length: 10        Width: 5     Depth: 3   Debridement depth beyond dead/damaged tissue down to healthy viable tissue: yes  Tissue layer involved: skin, subcutaneous tissue  Nature of tissue removed: Devitalized Tissue and Non-viable tissue  Irrigation volume: 1 liter     Irrigation fluid type: Normal Saline     DISCHARGE PLANNING:  Antibiotic duration: Preoperative antibiotics  Weightbearing: Weightbearing as tolerated  Pain medication: Prescription for Percocet  Dressing care/ Wound VAC: Continue wound VAC for 1 week  Ambulatory devices: Walker  Discharge to: Home.  Follow-up: In the office 1 week post operative.

## 2021-05-13 NOTE — Anesthesia Procedure Notes (Signed)
Procedure Name: LMA Insertion Date/Time: 05/13/2021 12:02 PM Performed by: Gwyndolyn Saxon, CRNA Pre-anesthesia Checklist: Patient identified, Emergency Drugs available, Suction available and Patient being monitored Patient Re-evaluated:Patient Re-evaluated prior to induction Oxygen Delivery Method: Circle system utilized Preoxygenation: Pre-oxygenation with 100% oxygen Induction Type: IV induction Ventilation: Mask ventilation without difficulty LMA: LMA with gastric port inserted LMA Size: 4.0 Number of attempts: 1 Placement Confirmation: positive ETCO2 and breath sounds checked- equal and bilateral Tube secured with: Tape Dental Injury: Teeth and Oropharynx as per pre-operative assessment

## 2021-05-13 NOTE — Anesthesia Postprocedure Evaluation (Signed)
Anesthesia Post Note  Patient: [place] Whitter  Procedure(s) Performed: DEBRIDEMENT LEFT KNEE (Left: Knee)     Patient location during evaluation: PACU Anesthesia Type: General Level of consciousness: awake and alert Pain management: pain level controlled Vital Signs Assessment: post-procedure vital signs reviewed and stable Respiratory status: spontaneous breathing, nonlabored ventilation, respiratory function stable and patient connected to nasal cannula oxygen Cardiovascular status: blood pressure returned to baseline and stable Postop Assessment: no apparent nausea or vomiting Anesthetic complications: no   No notable events documented.  Last Vitals:  Vitals:   05/13/21 1245 05/13/21 1300  BP: (!) 136/109 (!) 140/99  Pulse: 91 85  Resp: 15 13  Temp:  37.1 C  SpO2: 94% 92%    Last Pain:  Vitals:   05/13/21 1300  TempSrc:   PainSc: 0-No pain                 Trevor Iha

## 2021-05-13 NOTE — H&P (Signed)
Meagan Oneill is an 31 y.o. female.   Chief Complaint: Open wound left knee. HPI: Patient is a 31 year old woman who is status post multi ligament reconstruction of her left knee.  Patient has had a wound breakdown and has undergone conservative therapy including use of a wound VAC.  With persistent nonviable tissue within the wound bed patient presents at this time for further debridement closure and application of a incisional wound VAC.  Past Medical History:  Diagnosis Date   Anemia    Asthma    no inhaler   Environmental allergies    Hypertension     Past Surgical History:  Procedure Laterality Date   KNEE ARTHROSCOPY WITH ANTERIOR CRUCIATE LIGAMENT (ACL) REPAIR WITH HAMSTRING GRAFT Left 03/25/2021   Procedure: left knee anterior cruciate ligament, posterior cruciate ligment, posterolateral corner reconstruction arthroscopy; allograft ligaments for reconstruction, meniscal debridement vs repair;  Surgeon: Cammy Copa, MD;  Location: [facility] OR;  Service: Orthopedics;  Laterality: Left;   WISDOM TOOTH EXTRACTION      Family History  Problem Relation Age of Onset   Hypertension Mother    Diabetes Mother    Hypertension Sister    Social History:  reports that she has never smoked. She has never used smokeless tobacco. She reports that she does not currently use alcohol after a past usage of about 1.0 standard drink per week. She reports that she does not use drugs.  Allergies:  Allergies  Allergen Reactions   Morphine And Related Hives    04/07/2021- Patient is currently tolerating tramadol and oxycodone without reaction.     Medications Prior to Admission  Medication Sig Dispense Refill   celecoxib (CELEBREX) 200 MG capsule Take 1 capsule (200 mg total) by mouth daily. 30 capsule 0   methocarbamol (ROBAXIN) 500 MG tablet Take 1 tablet (500 mg total) by mouth every 6 (six) hours as needed for muscle spasms. 30 tablet 0   metoprolol tartrate (LOPRESSOR) 25 MG tablet Take 1  tablet (25 mg total) by mouth 2 (two) times daily. 60 tablet 0   rivaroxaban (XARELTO) 10 MG TABS tablet Take 1 tablet (10 mg total) by mouth daily. 30 tablet 0   docusate sodium (COLACE) 100 MG capsule Take 1 capsule (100 mg total) by mouth 2 (two) times daily. (Patient not taking: Reported on 05/12/2021) 60 capsule 0   hydrocerin (EUCERIN) CREA Apply 1 application topically 3 (three) times daily as needed (dry skin). (Patient not taking: Reported on 05/12/2021)  0   melatonin 3 MG TABS tablet Take 1 tablet (3 mg total) by mouth at bedtime. (Patient not taking: Reported on 05/12/2021) 30 tablet 0   polyethylene glycol (MIRALAX / GLYCOLAX) 17 g packet Take 17 g by mouth daily as needed for mild constipation. (Patient not taking: Reported on 05/12/2021) 14 each 0   silver sulfADIAZINE (SILVADENE) 1 % cream Apply 1 application topically daily. (Patient not taking: Reported on 05/12/2021) 400 g 0   traMADol (ULTRAM) 50 MG tablet Take 1 tablet (50 mg total) by mouth every 6 (six) hours as needed for moderate pain. (Patient not taking: Reported on 05/12/2021) 24 tablet 0    No results found for this or any previous visit (from the past 48 hour(s)). No results found.  Review of Systems  All other systems reviewed and are negative.  Blood pressure 126/70, pulse 88, temperature 99.1 F (37.3 C), temperature source Oral, resp. rate 18, height 5\' 3"  (1.6 m), weight (!) 167.8 kg, last menstrual period  05/06/2021, SpO2 100 %. Physical Exam  Patient is alert oriented no adenopathy well-dressed normal affect normal respiratory effort.  Examination of the left knee there is a 3 cm wound and 2 cm deep with nonviable adipose tissue around the wound edges.  There is no ascending cellulitis no purulent drainage no signs of abscess. Assessment/Plan Assessment: Wound dehiscence status post multi ligament reconstruction left knee.  Plan: We will plan for debridement of the wound back to healthy viable tissue local tissue  rearrangement for wound closure and application of incisional wound VAC.  Discussed the risk and benefits including risk of the wound not healing need for additional surgery.  Patient states he understands wished to proceed at this time.  Nadara Mustard, MD 05/13/2021, 10:19 AM

## 2021-05-13 NOTE — Transfer of Care (Signed)
Immediate Anesthesia Transfer of Care Note  Patient: Meagan Oneill  Procedure(s) Performed: DEBRIDEMENT LEFT KNEE (Left: Knee)  Patient Location: PACU  Anesthesia Type:General  Level of Consciousness: drowsy and patient cooperative  Airway & Oxygen Therapy: Patient Spontanous Breathing and Patient connected to face mask oxygen  Post-op Assessment: Report given to RN and Post -op Vital signs reviewed and stable  Post vital signs: Reviewed and stable  Last Vitals:  Vitals Value Taken Time  BP 129/94 05/13/21 1231  Temp    Pulse 93 05/13/21 1233  Resp 13 05/13/21 1233  SpO2 96 % 05/13/21 1233  Vitals shown include unvalidated device data.  Last Pain:  Vitals:   05/13/21 1024  TempSrc:   PainSc: 0-No pain         Complications: No notable events documented.

## 2021-05-16 ENCOUNTER — Encounter (HOSPITAL_COMMUNITY): Payer: Self-pay | Admitting: Orthopedic Surgery

## 2021-05-20 ENCOUNTER — Ambulatory Visit (INDEPENDENT_AMBULATORY_CARE_PROVIDER_SITE_OTHER): Payer: Self-pay | Admitting: Family

## 2021-05-20 ENCOUNTER — Other Ambulatory Visit: Payer: Self-pay

## 2021-05-20 ENCOUNTER — Ambulatory Visit (HOSPITAL_COMMUNITY)
Admission: RE | Admit: 2021-05-20 | Discharge: 2021-05-20 | Disposition: A | Discharge status: Home / Self Care | Payer: Self-pay | Source: Ambulatory Visit | Attending: Orthopedic Surgery | Admitting: Orthopedic Surgery

## 2021-05-20 DIAGNOSIS — S81002S Unspecified open wound, left knee, sequela: Secondary | ICD-10-CM

## 2021-05-20 DIAGNOSIS — Z9889 Other specified postprocedural states: Secondary | ICD-10-CM

## 2021-05-20 NOTE — Progress Notes (Signed)
° °  Post-Op Visit Note   Patient: Meagan Oneill           Date of Birth: 02/14/1991           MRN: [id number] Visit Date: 05/20/2021 PCP: Patient, No Pcp Per (Inactive)  Chief Complaint: No chief complaint on file.   HPI:  HPI Patient is a 31 year old woman who presents status post debridement of the left knee on February 3 she has had a Prevena wound VAC and a knee immobilizer in place  Ortho Exam Wound VAC removed.  Incision well approximated sutures this is clean dry and intact nearly healed  Visit Diagnoses: No diagnosis found.  Plan: She may shower may get this wet she may remove the immobilizer when at rest.  She is to continue the immobilizer for any weightbearing activity.  We will send her for physical therapy she may remove the immobilizer in the setting of physical therapy she will follow-up with Dr. August Saucer in 2 weeks  Follow-Up Instructions: No follow-ups on file.   Imaging: No results found.  Orders:  No orders of the defined types were placed in this encounter.  No orders of the defined types were placed in this encounter.    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    S/P left knee surgery 03/25/2021   Past Medical History:  Diagnosis Date   Anemia    Asthma    no inhaler   Environmental allergies    Hypertension     Family History  Problem Relation Age of Onset   Hypertension Mother    Diabetes Mother    Hypertension Sister     Past Surgical History:  Procedure Laterality Date   I & D EXTREMITY Left 05/13/2021    Procedure: DEBRIDEMENT LEFT KNEE;  Surgeon: Nadara Mustard, MD;  Location: [facility] OR;  Service: Orthopedics;  Laterality: Left;   KNEE ARTHROSCOPY WITH ANTERIOR CRUCIATE LIGAMENT (ACL) REPAIR WITH HAMSTRING GRAFT Left 03/25/2021   Procedure: left knee anterior cruciate ligament, posterior cruciate ligment, posterolateral corner reconstruction arthroscopy; allograft ligaments for reconstruction, meniscal debridement vs repair;  Surgeon: Cammy Copa, MD;  Location: [facility] OR;  Service: Orthopedics;  Laterality: Left;   WISDOM TOOTH EXTRACTION     Social History   Occupational History   Not on file  Tobacco Use   Smoking status: Never   Smokeless tobacco: Never  Vaping Use   Vaping Use: Never used  Substance and Sexual Activity   Alcohol use: Not Currently    Alcohol/week: 1.0 standard drink    Types: 1 Glasses of wine per week    Comment: occas.   Drug use: No   Sexual activity: Yes    Birth control/protection: None

## 2021-05-20 NOTE — Progress Notes (Signed)
Lower extremity venous LT study completed.   Please see CV Proc for preliminary results.   Drexel Ivey, RDMS, RVT  

## 2021-05-23 ENCOUNTER — Other Ambulatory Visit: Payer: Self-pay

## 2021-05-23 ENCOUNTER — Ambulatory Visit (HOSPITAL_COMMUNITY): Payer: Self-pay | Attending: Family | Admitting: Physical Therapy

## 2021-05-23 DIAGNOSIS — R29898 Other symptoms and signs involving the musculoskeletal system: Secondary | ICD-10-CM | POA: Diagnosis present

## 2021-05-23 DIAGNOSIS — Z9889 Other specified postprocedural states: Secondary | ICD-10-CM | POA: Insufficient documentation

## 2021-05-23 DIAGNOSIS — R262 Difficulty in walking, not elsewhere classified: Secondary | ICD-10-CM | POA: Insufficient documentation

## 2021-05-23 NOTE — Therapy (Signed)
[facility] [address] [place], [place], [address] Phone: [phone]   Fax:  [phone]  Physical Therapy Evaluation  Patient Details  Name: Meagan Oneill MRN: [id number] Date of Birth: [birth year] Referring Provider (PT): Adonis Huguenin, NP   Encounter Date: 05/23/2021   PT End of Session - 05/23/21 7591     Visit Number 1    Number of Visits 24    Date for PT Re-Evaluation 08/15/21    Authorization Type Self Pay    Progress Note Due on Visit 10    PT Start Time 0809    PT Stop Time 0900    PT Time Calculation (min) 51 min    Equipment Utilized During Treatment Left knee immobilizer    Activity Tolerance Patient tolerated treatment well    Behavior During Therapy [facility] for tasks assessed/performed             Past Medical History:  Diagnosis Date   Anemia    Asthma    no inhaler   Environmental allergies    Hypertension     Past Surgical History:  Procedure Laterality Date   I & D EXTREMITY Left 05/13/2021   Procedure: DEBRIDEMENT LEFT KNEE;  Surgeon: Nadara Mustard, MD;  Location: [facility] OR;  Service: Orthopedics;  Laterality: Left;   KNEE ARTHROSCOPY WITH ANTERIOR CRUCIATE LIGAMENT (ACL) REPAIR WITH HAMSTRING GRAFT Left 03/25/2021   Procedure: left knee anterior cruciate ligament, posterior cruciate ligment, posterolateral corner reconstruction arthroscopy; allograft ligaments for reconstruction, meniscal debridement vs repair;  Surgeon: Cammy Copa, MD;  Location: [facility] OR;  Service: Orthopedics;  Laterality: Left;   WISDOM TOOTH EXTRACTION      There were no vitals filed for this visit.    Subjective Assessment - 05/23/21 0811     Subjective Patient reports that she slipped on water at work 02/14/2021 and dislocated her knee. Imaging later showed that there were tears of the ACL, PCL, and PLC. Repairative surgery occurred on 03/25/2021 and another surgery for debridement of the meniscus on 05/13/2021. She states that  she did have one event of her knee buckling because her knee brace slid down on the way to the house. This occurred between the two surgeries. She has been using a wheeled walker and an immobilization brace during ambulation.    Currently in Pain? Yes    Pain Score 5     Pain Location Knee    Pain Orientation Left    Pain Descriptors / Indicators Burning    Pain Type Surgical pain    Pain Onset More than a month ago    Pain Frequency Constant                [facility] PT Assessment - 05/23/21 0001       Assessment   Medical Diagnosis s/p Reconstruction of ligaments of knee(ACL/PCL/PLC)    Referring Provider (PT) Adonis Huguenin, NP    Onset Date/Surgical Date 03/25/21      Precautions   Precautions Knee      Restrictions   Weight Bearing Restrictions Yes    LLE Weight Bearing Touchdown weight bearing      Home Environment   Living Environment Private residence    Living Arrangements Alone    Available Help at Discharge Family;Friend(s)    Type of Home Apartment    Home Access Stairs to enter    Entrance Stairs-Number of Steps 3    Entrance Stairs-Rails Left  Home Layout One level    [place] - 2 wheels      Prior Function   Level of Independence Independent with household mobility with device    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     Cognition   Overall Cognitive Status Within Functional Limits for tasks assessed    Attention Focused      Sensation   Light Touch --   Denies sensation changes     ROM / Strength   AROM / PROM / Strength PROM;Strength      PROM   PROM Assessment Site Knee    Right/Left Knee Right;Left    Right Knee Extension 3    Right Knee Flexion 123    Left Knee Extension 3    Left Knee Flexion 63      Strength   Overall Strength Comments Adequate VMO activation and no quad lag demonstrated during SLR    Strength Assessment Site Knee    Right/Left Knee Right;Left      Palpation    Patella mobility Tight with vertical motion / similar to non-op side with alteral mobility                        Objective measurements completed on examination: See above findings.                PT Education - 05/23/21 1018     Education Details HEP and POC    Person(s) Educated Patient    Methods Explanation;Demonstration;Handout    Comprehension Verbalized understanding                         Plan - 05/23/21 1005     Clinical Impression Statement Patient is a 31 y.o. female presenting to physical therapy following ACL/PCL/PLC reconstruction(03/25/2021) along with a later meniscal debridement(05/13/2021). She presents with pain limited deficits in Lt knee strength, ROM, endurance, and functional mobility with ADL. She is having to modify and restrict ADL as indicated by subjective information and objective measures which is affecting overall participation. Patient will benefit from skilled physical therapy in order to improve function and reduce impairment.    Personal Factors and Comorbidities Fitness    Examination-Activity Limitations Bathing;Bed Mobility;Bend;Caring for Others;Carry;Dressing;Hygiene/Grooming;Lift;Toileting;Stand;Stairs;Squat;Sit;Locomotion Level;Transfers    Examination-Participation Restrictions Cleaning;Community Activity;Driving;Interpersonal Relationship;Shop;Yard Work;Occupation;Laundry    Stability/Clinical Decision Making Stable/Uncomplicated    Clinical Decision Making Low    Rehab Potential Poor    PT Frequency 2x / week    PT Duration 12 weeks    PT Treatment/Interventions ADLs/Self Care Home Management;DME Instruction;Electrical Stimulation;Neuromuscular re-education;Balance training;Therapeutic exercise;Therapeutic activities;Functional mobility training;Stair training;Gait training;Patient/family education;Manual techniques;Passive range of motion    PT Next Visit Plan Continue with knee flexion ROM exercises  and low level strengthening. Discuss and train on stair negotiation.    PT Home Exercise Plan Sitting Heel Slide with Towel - 3-4 x daily - 7 x weekly - 6 reps - 15s hold  Long Sitting Ankle Pumps - 5-6 x daily - 7 x weekly - 20 reps  Supine Quad Set - 2-3 x daily - 7 x weekly - 8 reps - 5s hold  Sidelying Hip Abduction - 2-3 x daily - 7 x weekly - 3 sets - 10 reps  Prone Knee Flexion - 2-3 x daily - 7 x weekly - 3 sets - 12 reps  Seated Knee Extension AROM - 2-3  x daily - 7 x weekly - 3 sets - 10 reps    Consulted and Agree with Plan of Care Patient             Patient will benefit from skilled therapeutic intervention in order to improve the following deficits and impairments:  Decreased range of motion, Difficulty walking, Decreased activity tolerance, Pain, Hypomobility, Decreased strength  Visit Diagnosis: S/P reconstruction of ligament of knee  Difficulty in walking, not elsewhere classified  Decreased strength involving knee joint     Problem List Patient Active Problem List   Diagnosis Date Noted   Dehiscence of incision, sequela    Open knee wound, left, sequela    Knee dislocation 04/05/2021   Severe obesity (BMI >= 40) ([place]) 04/05/2021   Rupture of posterior cruciate ligament of left knee    Left anterior cruciate ligament tear    Acute lateral meniscus tear of left knee    Injury of posterolateral corner of knee, left, subsequent encounter    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

## 2021-05-23 NOTE — Patient Instructions (Signed)
Access Code: [email or web address] URL: [email or web address] Date: 05/23/2021 Prepared by: Creig Hines  Exercises Sitting Heel Slide with Towel - 3-4 x daily - 7 x weekly - 6 reps - 15s hold Long Sitting Ankle Pumps - 5-6 x daily - 7 x weekly - 20 reps Supine Quad Set - 2-3 x daily - 7 x weekly - 8 reps - 5s hold Sidelying Hip Abduction - 2-3 x daily - 7 x weekly - 3 sets - 10 reps Prone Knee Flexion - 2-3 x daily - 7 x weekly - 3 sets - 12 reps Seated Knee Extension AROM - 2-3 x daily - 7 x weekly - 3 sets - 10 reps

## 2021-05-25 ENCOUNTER — Encounter: Payer: Self-pay | Admitting: Family

## 2021-06-01 ENCOUNTER — Encounter (HOSPITAL_COMMUNITY): Payer: Self-pay | Admitting: Physical Therapy

## 2021-06-01 ENCOUNTER — Ambulatory Visit (HOSPITAL_COMMUNITY): Payer: Self-pay | Admitting: Physical Therapy

## 2021-06-01 ENCOUNTER — Other Ambulatory Visit: Payer: Self-pay

## 2021-06-01 DIAGNOSIS — R29898 Other symptoms and signs involving the musculoskeletal system: Secondary | ICD-10-CM

## 2021-06-01 DIAGNOSIS — R262 Difficulty in walking, not elsewhere classified: Secondary | ICD-10-CM

## 2021-06-01 DIAGNOSIS — Z9889 Other specified postprocedural states: Secondary | ICD-10-CM

## 2021-06-01 NOTE — Therapy (Signed)
[facility] [address] [place], [place], [address] Phone: [phone]   Fax:  [phone]  Physical Therapy Treatment  Patient Details  Name: Meagan Oneill MRN: [id number] Date of Birth: 1990/09/28 Referring Provider (PT): Adonis Huguenin, NP   Encounter Date: 06/01/2021   PT End of Session - 06/01/21 1022     Visit Number 2    Number of Visits 24    Date for PT Re-Evaluation 08/15/21    Authorization Type Self Pay    Progress Note Due on Visit 10    PT Start Time 1012   pt late for appointment   PT Stop Time 1045    PT Time Calculation (min) 33 min    Equipment Utilized During Treatment Left knee immobilizer    Activity Tolerance Patient tolerated treatment well    Behavior During Therapy [facility] for tasks assessed/performed             Past Medical History:  Diagnosis Date   Anemia    Asthma    no inhaler   Environmental allergies    Hypertension     Past Surgical History:  Procedure Laterality Date   I & D EXTREMITY Left 05/13/2021   Procedure: DEBRIDEMENT LEFT KNEE;  Surgeon: Nadara Mustard, MD;  Location: [facility] OR;  Service: Orthopedics;  Laterality: Left;   KNEE ARTHROSCOPY WITH ANTERIOR CRUCIATE LIGAMENT (ACL) REPAIR WITH HAMSTRING GRAFT Left 03/25/2021   Procedure: left knee anterior cruciate ligament, posterior cruciate ligment, posterolateral corner reconstruction arthroscopy; allograft ligaments for reconstruction, meniscal debridement vs repair;  Surgeon: Cammy Copa, MD;  Location: [facility] OR;  Service: Orthopedics;  Laterality: Left;   WISDOM TOOTH EXTRACTION      There were no vitals filed for this visit.   Subjective Assessment - 06/01/21 1012     Subjective PT states that she did well with the exercises that she was given.    Currently in Pain? No/denies    Pain Onset More than a month ago                               [facility] Adult PT Treatment/Exercise - 06/01/21 0001        Exercises   Exercises Knee/Hip      Knee/Hip Exercises: Standing   Heel Raises Both;2 sets;5 reps    Heel Raises Limitations no brace    Functional Squat 10 reps      Knee/Hip Exercises: Supine   Quad Sets Right;5 reps    Quad Sets Limitations Ham set 5 at various angles    Straight Leg Raises 10 reps                       PT Short Term Goals - 06/01/21 1043       PT SHORT TERM GOAL #1   Title Patient will achieve and maintain a Lt knee AROM equal to that of the Rt knee.    Time 6    Period Weeks    Status On-going    Target Date 07/04/21      PT SHORT TERM GOAL #2   Title Patient will demonstrate a normalized gait during a and without an AD.    Time 6    Period Weeks    Status On-going    Target Date 07/04/21      PT SHORT TERM GOAL #3   Title  Patient will have no swelling/pain following exercise.    Time 6    Period Weeks    Status On-going    Target Date 07/04/21               PT Long Term Goals - 06/01/21 1043       PT LONG TERM GOAL #1   Title Patient will report no episodes of instability during normal household tasks.    Time 12    Period Weeks    Status On-going    Target Date 08/15/21      PT LONG TERM GOAL #2   Title Patient will be able to evenly distribute BW through the LEs during 10 consecutive BW squats without pain and to a depth of at least 60 degrees of knee flexion.    Time 12    Period Weeks    Status On-going    Target Date 08/15/21      PT LONG TERM GOAL #3   Title Lt quad extension strength will be 80% or greater of that of the Rt knee.    Time 12    Period Weeks    Status On-going    Target Date 08/15/21      PT LONG TERM GOAL #4   Title Lt quad flexion strength will be 80% or greater of that of the Rt knee.    Time 12    Period Weeks    Status On-going    Target Date 08/15/21      PT LONG TERM GOAL #5   Title Patient will be able to negotiate a normal flight of stairs(12-16 steps) reciprocally  with the use of 1 hand rail and without an onset of Lt knee pain.    Time 12    Period Weeks    Status On-going    Target Date 08/15/21                   Plan - 06/01/21 1022     Clinical Impression Statement Goals reviewed with patient.  PT is 10 weeks post op, ACL,PCL, reconstruction .  Pt 11 minutes late to therapy.  Added SLR, prone SLR, heel raises and minisquts to program..    Personal Factors and Comorbidities Fitness    Examination-Activity Limitations Bathing;Bed Mobility;Bend;Caring for Others;Carry;Dressing;Hygiene/Grooming;Lift;Toileting;Stand;Stairs;Squat;Sit;Locomotion Level;Transfers    Examination-Participation Restrictions Cleaning;Community Activity;Driving;Interpersonal Relationship;Shop;Yard Work;Occupation;Laundry    Stability/Clinical Decision Making Stable/Uncomplicated    Rehab Potential Poor    PT Frequency 2x / week    PT Duration 12 weeks    PT Treatment/Interventions ADLs/Self Care Home Management;DME Instruction;Electrical Stimulation;Neuromuscular re-education;Balance training;Therapeutic exercise;Therapeutic activities;Functional mobility training;Stair training;Gait training;Patient/family education;Manual techniques;Passive range of motion    PT Next Visit Plan Continue with knee flexion ROM exercises and low level strengthening. PT is 10 weeks post repair able to be progressing to standing activity at this time add step up next session.  Discuss and train on stair negotiation.    PT Home Exercise Plan Sitting Heel Slide with Towel - 3-4 x daily - 7 x weekly - 6 reps - 15s hold  Long Sitting Ankle Pumps - 5-6 x daily - 7 x weekly - 20 reps  Supine Quad Set - 2-3 x daily - 7 x weekly - 8 reps - 5s hold  Sidelying Hip Abduction - 2-3 x daily - 7 x weekly - 3 sets - 10 reps  Prone Knee Flexion - 2-3 x daily - 7 x weekly - 3 sets - 12  reps  Seated Knee Extension AROM - 2-3 x daily - 7 x weekly - 3 sets - 10 reps;  2/22 : SLR, prone hip extension , heel Raises  minisquats.    Consulted and Agree with Plan of Care Patient             Patient will benefit from skilled therapeutic intervention in order to improve the following deficits and impairments:  Decreased range of motion, Difficulty walking, Decreased activity tolerance, Pain, Hypomobility, Decreased strength  Visit Diagnosis: S/P reconstruction of ligament of knee  Difficulty in walking, not elsewhere classified  Decreased strength involving knee joint     Problem List Patient Active Problem List   Diagnosis Date Noted   Dehiscence of incision, sequela    Open knee wound, left, sequela    Knee dislocation 04/05/2021   Severe obesity (BMI >= 40) (HCC) 04/05/2021   Rupture of posterior cruciate ligament of left knee    Left anterior cruciate ligament tear    Acute lateral meniscus tear of left knee    Injury of posterolateral corner of knee, left, subsequent encounter    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 [place], [place], [address] Phone: [phone]   Fax:  [phone]  Name: Meagan Kisner MRN: [id number] Date of Birth: 18-Mar-1991

## 2021-06-02 ENCOUNTER — Ambulatory Visit (HOSPITAL_COMMUNITY): Payer: Self-pay | Admitting: Physical Therapy

## 2021-06-02 ENCOUNTER — Encounter: Payer: Self-pay | Admitting: Orthopedic Surgery

## 2021-06-02 DIAGNOSIS — R29898 Other symptoms and signs involving the musculoskeletal system: Secondary | ICD-10-CM

## 2021-06-02 DIAGNOSIS — Z9889 Other specified postprocedural states: Secondary | ICD-10-CM

## 2021-06-02 DIAGNOSIS — R262 Difficulty in walking, not elsewhere classified: Secondary | ICD-10-CM

## 2021-06-02 NOTE — Therapy (Signed)
[facility] [place], [place], [address] Phone: [phone]   Fax:  [phone]  Physical Therapy Treatment  Patient Details  Name: Meagan Oneill MRN: [id number] Date of Birth: 02-28-1991 Referring Provider (PT): Dr Alphonzo Severance   Encounter Date: 06/02/2021   PT End of Session - 06/02/21 1322     Visit Number 3    Number of Visits 24    Date for PT Re-Evaluation 08/15/21    Authorization Type Self Pay    Progress Note Due on Visit 10    PT Start Time 1140    PT Stop Time 1220    PT Time Calculation (min) 40 min    Equipment Utilized During Treatment Left knee immobilizer    Activity Tolerance Patient tolerated treatment well    Behavior During Therapy [facility] for tasks assessed/performed             Past Medical History:  Diagnosis Date   Anemia    Asthma    no inhaler   Environmental allergies    Hypertension     Past Surgical History:  Procedure Laterality Date   I & D EXTREMITY Left 05/13/2021   Procedure: DEBRIDEMENT LEFT KNEE;  Surgeon: Newt Minion, MD;  Location: [facility];  Service: Orthopedics;  Laterality: Left;   KNEE ARTHROSCOPY WITH ANTERIOR CRUCIATE LIGAMENT (ACL) REPAIR WITH HAMSTRING GRAFT Left 03/25/2021   Procedure: left knee anterior cruciate ligament, posterior cruciate ligment, posterolateral corner reconstruction arthroscopy; allograft ligaments for reconstruction, meniscal debridement vs repair;  Surgeon: Meredith Pel, MD;  Location: [facility];  Service: Orthopedics;  Laterality: Left;   WISDOM TOOTH EXTRACTION      There were no vitals filed for this visit.   Subjective Assessment - 06/02/21 1149     Subjective Pt states she goes back to Dr Marlou Sa Monday.  STates she still has stitches from her I&D nearly 3 weeks ago.  STates she has no pain or issues currently and is walking with RW wearing Knee immobilizer on Lt LE.    Currently in Pain? No/denies                [facility] PT Assessment -  06/02/21 0001       Assessment   Medical Diagnosis s/p Reconstruction of ligaments of knee(ACL/PCL/PLC)    Referring Provider (PT) Dr Alphonzo Severance      Precautions   Precautions Knee    Precaution Comments ACL with hamstring autograft    Required Braces or Orthoses Knee Immobilizer - Left      Restrictions   Weight Bearing Restrictions No      PROM   Left Knee Extension 0   was lacking 3 degrees   Left Knee Flexion 95   was 63 degrees                          [facility] Adult PT Treatment/Exercise - 06/02/21 0001       Exercises   Exercises Knee/Hip      Knee/Hip Exercises: Standing   Heel Raises 10 reps;2 sets    Heel Raises Limitations pushing up with LE's, less UE pushing    Functional Squat 10 reps    Functional Squat Limitations minisquats at bedside      Knee/Hip Exercises: Supine   Quad Sets Left;10 reps    Short Arc Quad Sets Left;2 sets;10 reps    Short Arc Quad Sets Limitations 5" hold  Straight Leg Raises Left;2 sets;10 reps    Straight Leg Raises Limitations cues for keeping toe into neutral and completing QS/increased quality    Knee Extension AROM    Knee Extension Limitations 0    Knee Flexion AROM    Knee Flexion Limitations 95      Knee/Hip Exercises: Sidelying   Hip ABduction Left;2 sets;10 reps                       PT Short Term Goals - 06/01/21 1043       PT SHORT TERM GOAL #1   Title Patient will achieve and maintain a Lt knee AROM equal to that of the Rt knee.    Time 6    Period Weeks    Status On-going    Target Date 07/04/21      PT SHORT TERM GOAL #2   Title Patient will demonstrate a normalized gait during a 2MWT and without an AD.    Time 6    Period Weeks    Status On-going    Target Date 07/04/21      PT SHORT TERM GOAL #3   Title Patient will have no swelling/pain following exercise.    Time 6    Period Weeks    Status On-going    Target Date 07/04/21               PT Long Term  Goals - 06/01/21 1043       PT LONG TERM GOAL #1   Title Patient will report no episodes of instability during normal household tasks.    Time 12    Period Weeks    Status On-going    Target Date 08/15/21      PT LONG TERM GOAL #2   Title Patient will be able to evenly distribute BW through the LEs during 10 consecutive BW squats without pain and to a depth of at least 60 degrees of knee flexion.    Time 12    Period Weeks    Status On-going    Target Date 08/15/21      PT LONG TERM GOAL #3   Title Lt quad extension strength will be 80% or greater of that of the Rt knee.    Time 12    Period Weeks    Status On-going    Target Date 08/15/21      PT LONG TERM GOAL #4   Title Lt quad flexion strength will be 80% or greater of that of the Rt knee.    Time 12    Period Weeks    Status On-going    Target Date 08/15/21      PT LONG TERM GOAL #5   Title Patient will be able to negotiate a normal flight of stairs(12-16 steps) reciprocally with the use of 1 hand rail and without an onset of Lt knee pain.    Time 12    Period Weeks    Status On-going    Target Date 08/15/21                   Plan - 06/02/21 1329     Clinical Impression Statement Continued with guidance from [place] [facility] protocol week 10 post op, phase I as one was not provided with patient.  Therapist has sent message to Dr Marlou Sa requesting specific precautions/protocol and patient will also ask for one when she returns on Monday.  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   Suggested she purchase something to keep her feet into neutral at night as typical posturing is both LEs into ER and this is also putting direct pressure on her ACL incision that is not healing. Educated on how pressure cuts off blood flow and prevents healing. Pt has met goals for phase I and ready to progress to phase II.    Personal Factors and  Comorbidities Fitness    Examination-Activity Limitations Bathing;Bed Mobility;Bend;Caring for Others;Carry;Dressing;Hygiene/Grooming;Lift;Toileting;Stand;Stairs;Squat;Sit;Locomotion Level;Transfers    Examination-Participation Restrictions Cleaning;Community Activity;Driving;Interpersonal Relationship;Shop;Yard Work;Occupation;Laundry    Stability/Clinical Decision Making Stable/Uncomplicated    Rehab Potential Poor    PT Frequency 2x / week    PT Duration 12 weeks    PT Treatment/Interventions ADLs/Self Care Home Management;DME Instruction;Electrical Stimulation;Neuromuscular re-education;Balance training;Therapeutic exercise;Therapeutic activities;Functional mobility training;Stair training;Gait training;Patient/family education;Manual techniques;Passive range of motion    PT Next Visit Plan Continue with ACL protocol. PT is 10 weeks post repair, has met goals for phase I and ready to progress to phase II.  F/U with MD appt Monday and see if pt returns to protocol/any restrictions.    PT Home Exercise Plan Sitting Heel Slide with Towel - 3-4 x daily - 7 x weekly - 6 reps - 15s hold  Long Sitting Ankle Pumps - 5-6 x daily - 7 x weekly - 20 reps  Supine Quad Set - 2-3 x daily - 7 x weekly - 8 reps - 5s hold  Sidelying Hip Abduction - 2-3 x daily - 7 x weekly - 3 sets - 10 reps  Prone Knee Flexion - 2-3 x daily - 7 x weekly - 3 sets - 12 reps  Seated Knee Extension AROM - 2-3 x daily - 7 x weekly - 3 sets - 10 reps;  2/22 : SLR, prone hip extension , heel Raises minisquats.    Consulted and Agree with Plan of Care Patient             Patient will benefit from skilled therapeutic intervention in order to improve the following deficits and impairments:  Decreased range of motion, Difficulty walking, Decreased activity tolerance, Pain, Hypomobility, Decreased strength  Visit Diagnosis: S/P reconstruction of ligament of knee  Decreased strength involving knee joint  Difficulty in walking, not  elsewhere classified     Problem List Patient Active Problem List   Diagnosis Date Noted   Dehiscence of incision, sequela    Open knee wound, left, sequela    Knee dislocation 04/05/2021   Severe obesity (BMI >= 40) ([place]) 04/05/2021   Rupture of posterior cruciate ligament of left knee    Left anterior cruciate ligament tear    Acute lateral meniscus tear of left knee    Injury of posterolateral corner of knee, left, subsequent encounter    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 [place], [place], [address] Phone: [phone]   Fax:  [phone]  Name: Meagan Sookdeo MRN: [id number] Date of Birth: [birth year]

## 2021-06-03 ENCOUNTER — Other Ambulatory Visit: Payer: Self-pay

## 2021-06-03 DIAGNOSIS — Z9889 Other specified postprocedural states: Secondary | ICD-10-CM

## 2021-06-03 NOTE — Telephone Encounter (Signed)
Okay for range of motion left knee.  Okay for leg strengthening exercises in terms of straight leg raises.  No varus stress left lower extremity     range of motion has no restriction.

## 2021-06-06 ENCOUNTER — Ambulatory Visit (INDEPENDENT_AMBULATORY_CARE_PROVIDER_SITE_OTHER): Payer: Worker's Compensation | Admitting: Orthopedic Surgery

## 2021-06-06 ENCOUNTER — Other Ambulatory Visit: Payer: Self-pay

## 2021-06-06 DIAGNOSIS — Z9889 Other specified postprocedural states: Secondary | ICD-10-CM

## 2021-06-07 ENCOUNTER — Ambulatory Visit (HOSPITAL_COMMUNITY): Payer: Self-pay

## 2021-06-07 DIAGNOSIS — R262 Difficulty in walking, not elsewhere classified: Secondary | ICD-10-CM

## 2021-06-07 DIAGNOSIS — Z9889 Other specified postprocedural states: Secondary | ICD-10-CM

## 2021-06-07 DIAGNOSIS — R29898 Other symptoms and signs involving the musculoskeletal system: Secondary | ICD-10-CM

## 2021-06-07 NOTE — Therapy (Signed)
[facility] [place], [place], [address] Phone: [phone]   Fax:  [phone]  Physical Therapy Treatment  Patient Details  Name: Meagan Oneill MRN: [id number] Date of Birth: [birth year] Referring Provider (PT): Dr Alphonzo Severance   Encounter Date: 06/07/2021   PT End of Session - 06/07/21 1424     Visit Number 4    Number of Visits 24    Date for PT Re-Evaluation 08/15/21    Authorization Type Self Pay    Progress Note Due on Visit 10    PT Start Time 1424    PT Stop Time 1515    PT Time Calculation (min) 51 min    Equipment Utilized During Treatment Left knee immobilizer    Activity Tolerance Patient tolerated treatment well    Behavior During Therapy [facility] for tasks assessed/performed             Past Medical History:  Diagnosis Date   Anemia    Asthma    no inhaler   Environmental allergies    Hypertension     Past Surgical History:  Procedure Laterality Date   I & D EXTREMITY Left 05/13/2021   Procedure: DEBRIDEMENT LEFT KNEE;  Surgeon: Newt Minion, MD;  Location: [facility];  Service: Orthopedics;  Laterality: Left;   KNEE ARTHROSCOPY WITH ANTERIOR CRUCIATE LIGAMENT (ACL) REPAIR WITH HAMSTRING GRAFT Left 03/25/2021   Procedure: left knee anterior cruciate ligament, posterior cruciate ligment, posterolateral corner reconstruction arthroscopy; allograft ligaments for reconstruction, meniscal debridement vs repair;  Surgeon: Meredith Pel, MD;  Location: [facility];  Service: Orthopedics;  Laterality: Left;   WISDOM TOOTH EXTRACTION      There were no vitals filed for this visit.   Subjective Assessment - 06/07/21 1425     Subjective Patient reports she had a good report from Dr. Marlou Sa; she states she is not having any pain currenly    Currently in Pain? No/denies    Pain Score 0-No pain                               [facility] Adult PT Treatment/Exercise - 06/07/21 0001       Knee/Hip  Exercises: Standing   Heel Raises 10 reps;2 sets    Heel Raises Limitations pushing up with LE's, less UE pushing    Terminal Knee Extension Left;10 reps    Theraband Level (Terminal Knee Extension) Level 3 (Green)    Hip Abduction Knee bent    Forward Step Up Hand Hold: 2;Step Height: 4"    Forward Step Up Limitations over and back 1 time with verbal cues for technique and sequencing    Functional Squat 10 reps    Functional Squat Limitations minisquats at bedside      Knee/Hip Exercises: Supine   Quad Sets 10 reps    Short Arc Quad Sets Left;2 sets;10 reps    Short Arc Quad Sets Limitations 1#    Straight Leg Raises Left;10 reps;1 set    Straight Leg Raises Limitations cues for keeping toe into neutral and completing QS/increased quality; 1# weight added today      Knee/Hip Exercises: Sidelying   Hip ABduction 4 sets                     PT Education - 06/07/21 1532     Education Details review of HEP    Person(s) Educated Patient  Comprehension Verbalized understanding;Returned demonstration;Need further instruction              PT Short Term Goals - 06/01/21 1043       PT SHORT TERM GOAL #1   Title Patient will achieve and maintain a Lt knee AROM equal to that of the Rt knee.    Time 6    Period Weeks    Status On-going    Target Date 07/04/21      PT SHORT TERM GOAL #2   Title Patient will demonstrate a normalized gait during a 2MWT and without an AD.    Time 6    Period Weeks    Status On-going    Target Date 07/04/21      PT SHORT TERM GOAL #3   Title Patient will have no swelling/pain following exercise.    Time 6    Period Weeks    Status On-going    Target Date 07/04/21               PT Long Term Goals - 06/01/21 1043       PT LONG TERM GOAL #1   Title Patient will report no episodes of instability during normal household tasks.    Time 12    Period Weeks    Status On-going    Target Date 08/15/21      PT LONG TERM GOAL  #2   Title Patient will be able to evenly distribute BW through the LEs during 10 consecutive BW squats without pain and to a depth of at least 60 degrees of knee flexion.    Time 12    Period Weeks    Status On-going    Target Date 08/15/21      PT LONG TERM GOAL #3   Title Lt quad extension strength will be 80% or greater of that of the Rt knee.    Time 12    Period Weeks    Status On-going    Target Date 08/15/21      PT LONG TERM GOAL #4   Title Lt quad flexion strength will be 80% or greater of that of the Rt knee.    Time 12    Period Weeks    Status On-going    Target Date 08/15/21      PT LONG TERM GOAL #5   Title Patient will be able to negotiate a normal flight of stairs(12-16 steps) reciprocally with the use of 1 hand rail and without an onset of Lt knee pain.    Time 12    Period Weeks    Status On-going    Target Date 08/15/21                   Plan - 06/07/21 1515     Clinical Impression Statement Patient reports some soreness with increased exercise but no pain.  Dr. Marlou Sa sent new referral with comment (see referral) "Continue with left knee PT ---Okay for range of motion L knee.  Okay for leg strengthening exercises. No Varus stress left lower extremity, range of motion has no restrictions".  Patient states some anxiety sometimes with activity as she is afraid her knee will buckle.  Patient asks about doing steps as she has 3 steps to navigate into and out of her apartment.  She has been going out the back door to get into a car but is not supposed to do that per her apartment complex. therapist added TKE's with  green theraband today and 1# weight to SLR and SAQs. Patient will benefit from continued Physical therapy services to address Left lower extremity weakness, transfer and gait training, fall prevention and working toward patient's PLOF.    Personal Factors and Comorbidities Fitness    Examination-Activity Limitations Bathing;Bed Mobility;Bend;Caring  for Others;Carry;Dressing;Hygiene/Grooming;Lift;Toileting;Stand;Stairs;Squat;Sit;Locomotion Level;Transfers    Examination-Participation Restrictions Cleaning;Community Activity;Driving;Interpersonal Relationship;Shop;Yard Work;Occupation;Laundry    Stability/Clinical Decision Making Stable/Uncomplicated    Clinical Decision Making Low    Rehab Potential Fair    PT Frequency 2x / week    PT Duration 12 weeks    PT Treatment/Interventions ADLs/Self Care Home Management;DME Instruction;Electrical Stimulation;Neuromuscular re-education;Balance training;Therapeutic exercise;Therapeutic activities;Functional mobility training;Stair training;Gait training;Patient/family education;Manual techniques;Passive range of motion    PT Next Visit Plan try nustep or bike to work on ROM of knee    PT Home Exercise Plan Sitting Heel Slide with Towel - 3-4 x daily - 7 x weekly - 6 reps - 15s hold  Long Sitting Ankle Pumps - 5-6 x daily - 7 x weekly - 20 reps  Supine Quad Set - 2-3 x daily - 7 x weekly - 8 reps - 5s hold  Sidelying Hip Abduction - 2-3 x daily - 7 x weekly - 3 sets - 10 reps  Prone Knee Flexion - 2-3 x daily - 7 x weekly - 3 sets - 12 reps  Seated Knee Extension AROM - 2-3 x daily - 7 x weekly - 3 sets - 10 reps;  2/22 : SLR, prone hip extension , heel Raises minisquats.    Consulted and Agree with Plan of Care Patient             Patient will benefit from skilled therapeutic intervention in order to improve the following deficits and impairments:  Decreased range of motion, Difficulty walking, Decreased activity tolerance, Pain, Hypomobility, Decreased strength  Visit Diagnosis: S/P reconstruction of ligament of knee  Decreased strength involving knee joint  Difficulty in walking, not elsewhere classified     Problem List Patient Active Problem List   Diagnosis Date Noted   Dehiscence of incision, sequela    Open knee wound, left, sequela    Knee dislocation 04/05/2021   Severe  obesity (BMI >= 40) ([place]) 04/05/2021   Rupture of posterior cruciate ligament of left knee    Left anterior cruciate ligament tear    Acute lateral meniscus tear of left knee    Injury of posterolateral corner of knee, left, subsequent encounter    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

## 2021-06-09 ENCOUNTER — Ambulatory Visit (HOSPITAL_COMMUNITY): Payer: Self-pay | Attending: Family | Admitting: Physical Therapy

## 2021-06-09 ENCOUNTER — Encounter: Payer: Self-pay | Admitting: Orthopedic Surgery

## 2021-06-09 ENCOUNTER — Encounter (HOSPITAL_COMMUNITY): Payer: Self-pay | Admitting: Physical Therapy

## 2021-06-09 ENCOUNTER — Other Ambulatory Visit: Payer: Self-pay

## 2021-06-09 DIAGNOSIS — Z9889 Other specified postprocedural states: Secondary | ICD-10-CM | POA: Insufficient documentation

## 2021-06-09 DIAGNOSIS — R262 Difficulty in walking, not elsewhere classified: Secondary | ICD-10-CM | POA: Insufficient documentation

## 2021-06-09 DIAGNOSIS — R29898 Other symptoms and signs involving the musculoskeletal system: Secondary | ICD-10-CM | POA: Insufficient documentation

## 2021-06-09 NOTE — Progress Notes (Signed)
° °  Post-Op Visit Note   Patient: Meagan Oneill           Date of Birth: 1990/09/17           MRN: [id number] Visit Date: 06/06/2021 PCP: Patient, No Pcp Per (Inactive)   Assessment & Plan:  Chief Complaint:  Chief Complaint  Patient presents with   Left Leg - Routine Post Op   Visit Diagnoses:  1. S/P reconstruction of ligament of knee     Plan: Meagan is a 31 year old patient who underwent left knee debridement on 05/13/2021 with Dr. Sharol Given.  She is otherwise about 6 weeks out multiligament reconstruction left knee including ACL PCL LCL and posterolateral corner.  On examination the incision looks good.  Anterior posterior stability is good.  Has about 2 more millimeters of laxity to varus stress at full extension on the left compared to the right.  About 7 more degrees of rotational laxity left versus right but overall the endpoints on both are good.  No effusion.  Extensor mechanism intact.  Range of motion 0-95.  Plan is to continue weightbearing as tolerated with walker.  Therapy also to begin.  Follow-up with [place] in 2 weeks approximately.  Follow-Up Instructions: Return in about 2 weeks (around 06/20/2021).   Orders:  No orders of the defined types were placed in this encounter.  No orders of the defined types were placed in this encounter.   Imaging: No results found.  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    S/P left knee surgery 03/25/2021   Past Medical History:  Diagnosis Date   Anemia    Asthma    no inhaler   Environmental  allergies    Hypertension     Family History  Problem Relation Age of Onset   Hypertension Mother    Diabetes Mother    Hypertension Sister     Past Surgical History:  Procedure Laterality Date   I & D EXTREMITY Left 05/13/2021   Procedure: DEBRIDEMENT LEFT KNEE;  Surgeon: Newt Minion, MD;  Location: [facility];  Service: Orthopedics;  Laterality: Left;   KNEE ARTHROSCOPY WITH ANTERIOR CRUCIATE LIGAMENT (ACL) REPAIR WITH HAMSTRING GRAFT Left 03/25/2021   Procedure: left knee anterior cruciate ligament, posterior cruciate ligment, posterolateral corner reconstruction arthroscopy; allograft ligaments for reconstruction, meniscal debridement vs repair;  Surgeon: Meredith Pel, MD;  Location: [facility];  Service: Orthopedics;  Laterality: Left;   WISDOM TOOTH EXTRACTION     Social History   Occupational History   Not on file  Tobacco Use   Smoking status: Never   Smokeless tobacco: Never  Vaping Use   Vaping Use: Never used  Substance and Sexual Activity   Alcohol use: Not Currently    Alcohol/week: 1.0 standard drink    Types: 1 Glasses of wine per week    Comment: occas.   Drug use: No   Sexual activity: Yes    Birth control/protection: None

## 2021-06-09 NOTE — Therapy (Signed)
[place] ?[facility] ?[address] ?[place], [place], [address] ?Phone: [phone]   Fax:  [phone] ? ?Physical Therapy Treatment ? ?Patient Details  ?Name: Meagan Oneill ?MRN: [id number] ?Date of Birth: 1991-03-06 ?Referring Provider (PT): Dr Dorene Grebe ? ? ?Encounter Date: 06/09/2021 ? ? PT End of Session - 06/09/21 1546   ? ? Visit Number 5   ? Number of Visits 24   ? Date for PT Re-Evaluation 08/15/21   ? Authorization Type Self Pay   ? Progress Note Due on Visit 10   ? PT Start Time 1400   ? PT Stop Time 1453   ? PT Time Calculation (min) 53 min   ? Activity Tolerance Patient tolerated treatment well   ? Behavior During Therapy [facility] for tasks assessed/performed   ? ?  ?  ? ?  ? ? ?Past Medical History:  ?Diagnosis Date  ? Anemia   ? Asthma   ? no inhaler  ? Environmental allergies   ? Hypertension   ? ? ?Past Surgical History:  ?Procedure Laterality Date  ? I & D EXTREMITY Left 05/13/2021  ? Procedure: DEBRIDEMENT LEFT KNEE;  Surgeon: Nadara Mustard, MD;  Location: [facility];  Service: Orthopedics;  Laterality: Left;  ? KNEE ARTHROSCOPY WITH ANTERIOR CRUCIATE LIGAMENT (ACL) REPAIR WITH HAMSTRING GRAFT Left 03/25/2021  ? Procedure: left knee anterior cruciate ligament, posterior cruciate ligment, posterolateral corner reconstruction arthroscopy; allograft ligaments for reconstruction, meniscal debridement vs repair;  Surgeon: Cammy Copa, MD;  Location: [facility] OR;  Service: Orthopedics;  Laterality: Left;  ? WISDOM TOOTH EXTRACTION    ? ? ?There were no vitals filed for this visit. ? ? Subjective Assessment - 06/09/21 1407   ? ? Subjective Pt states MD stated that he needed to use the knee immobilizer for a little longer   ? Currently in Pain? No/denies   ? Pain Score 0-No pain   ? ?  ?  ? ?  ? ? ? ? ? ? ? ? ? [facility] Adult PT Treatment/Exercise - 06/09/21 0001   ? ?  ? Ambulation/Gait  ? Ambulation Distance (Feet) 15 Feet   x2  ? Assistive device Straight cane   ? Gait Pattern Step-to  pattern   ?  ? Exercises  ? Exercises Knee/Hip   ?  ? Knee/Hip Exercises: Aerobic  ? Stationary Bike 5' rocking level 13  ?  ? Knee/Hip Exercises: Standing  ? Knee Flexion Strengthening;Left;5 reps   ? Forward Step Up Hand Hold: 2;Step Height: 4";Both   ? Forward Step Up Limitations no brace   ? Functional Squat 10 reps   ? Functional Squat Limitations at // no brace   ? ?  ?  ? ?  ? ? ? ? ? ? ? ? ? ? ? ? PT Short Term Goals - 06/01/21 1043   ? ?  ? PT SHORT TERM GOAL #1  ? Title Patient will achieve and maintain a Lt knee AROM equal to that of the Rt knee.   ? Time 6   ? Period Weeks   ? Status On-going   ? Target Date 07/04/21   ?  ? PT SHORT TERM GOAL #2  ? Title Patient will demonstrate a normalized gait during a and without an AD.   ? Time 6   ? Period Weeks   ? Status On-going   ? Target Date 07/04/21   ?  ? PT SHORT TERM GOAL #  3  ? Title Patient will have no swelling/pain following exercise.   ? Time 6   ? Period Weeks   ? Status On-going   ? Target Date 07/04/21   ? ?  ?  ? ?  ? ? ? ? PT Long Term Goals - 06/01/21 1043   ? ?  ? PT LONG TERM GOAL #1  ? Title Patient will report no episodes of instability during normal household tasks.   ? Time 12   ? Period Weeks   ? Status On-going   ? Target Date 08/15/21   ?  ? PT LONG TERM GOAL #2  ? Title Patient will be able to evenly distribute BW through the LEs during 10 consecutive BW squats without pain and to a depth of at least 60 degrees of knee flexion.   ? Time 12   ? Period Weeks   ? Status On-going   ? Target Date 08/15/21   ?  ? PT LONG TERM GOAL #3  ? Title Lt quad extension strength will be 80% or greater of that of the Rt knee.   ? Time 12   ? Period Weeks   ? Status On-going   ? Target Date 08/15/21   ?  ? PT LONG TERM GOAL #4  ? Title Lt quad flexion strength will be 80% or greater of that of the Rt knee.   ? Time 12   ? Period Weeks   ? Status On-going   ? Target Date 08/15/21   ?  ? PT LONG TERM GOAL #5  ? Title Patient will be able to negotiate  a normal flight of stairs(12-16 steps) reciprocally with the use of 1 hand rail and without an onset of Lt knee pain.   ? Time 12   ? Period Weeks   ? Status On-going   ? Target Date 08/15/21   ? ?  ?  ? ?  ? ? ? ? ? ? ? ? Plan - 06/09/21 1547   ? ? Clinical Impression Statement Pt completed exercises with no brace.  Pt began gt training with cane; placed on bike to imporve ROM.  PT very nervous on the bike.   ? Personal Factors and Comorbidities Fitness   ? Examination-Activity Limitations Bathing;Bed Mobility;Bend;Caring for Others;Carry;Dressing;Hygiene/Grooming;Lift;Toileting;Stand;Stairs;Squat;Sit;Locomotion Level;Transfers   ? Examination-Participation Restrictions Cleaning;Community Activity;Driving;Interpersonal Relationship;Shop;Yard Work;Occupation;Laundry   ? Stability/Clinical Decision Making Stable/Uncomplicated   ? Rehab Potential Fair   ? PT Frequency 2x / week   ? PT Duration 12 weeks   ? PT Treatment/Interventions ADLs/Self Care Home Management;DME Instruction;Electrical Stimulation;Neuromuscular re-education;Balance training;Therapeutic exercise;Therapeutic activities;Functional mobility training;Stair training;Gait training;Patient/family education;Manual techniques;Passive range of motion   ? PT Next Visit Plan try nustep or bike to work on ROM of knee   ? PT Home Exercise Plan Sitting Heel Slide with Towel - 3-4 x daily - 7 x weekly - 6 reps - 15s hold  Long Sitting Ankle Pumps - 5-6 x daily - 7 x weekly - 20 reps  Supine Quad Set - 2-3 x daily - 7 x weekly - 8 reps - 5s hold  Sidelying Hip Abduction - 2-3 x daily - 7 x weekly - 3 sets - 10 reps  Prone Knee Flexion - 2-3 x daily - 7 x weekly - 3 sets - 12 reps  Seated Knee Extension AROM - 2-3 x daily - 7 x weekly - 3 sets - 10 reps;  2/22 : SLR, prone hip extension , heel Raises  minisquats.   ? Consulted and Agree with Plan of Care Patient   ? ?  ?  ? ?  ? ? ?Patient will benefit from skilled therapeutic intervention in order to improve the  following deficits and impairments:  Decreased range of motion, Difficulty walking, Decreased activity tolerance, Pain, Hypomobility, Decreased strength ? ?Visit Diagnosis: ?S/P reconstruction of ligament of knee ? ?Decreased strength involving knee joint ? ?Difficulty in walking, not elsewhere classified ? ? ? ? ?Problem List ?Patient Active Problem List  ? Diagnosis Date Noted  ? Dehiscence of incision, sequela   ? Open knee wound, left, sequela   ? Knee dislocation 04/05/2021  ? Severe obesity (BMI >= 40) (HCC) 04/05/2021  ? Rupture of posterior cruciate ligament of left knee   ? Left anterior cruciate ligament tear   ? Acute lateral meniscus tear of left knee   ? Injury of posterolateral corner of knee, left, subsequent encounter   ? 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 S/P left knee surgery 03/25/2021  ? ?Virgina Organ, PT CLT ?[phone]  ?06/09/2021, 3:50 PM ? ?[place] ?[facility] ?[address] ?[place], [place], [address] ?Phone: [phone]   Fax:  [phone] ? ?Name: Meagan Cribb ?MRN: [id number] ?Date of Birth: 1990/08/30 ? ? ? ?

## 2021-06-14 ENCOUNTER — Other Ambulatory Visit: Payer: Self-pay

## 2021-06-14 ENCOUNTER — Ambulatory Visit (HOSPITAL_COMMUNITY): Payer: Self-pay | Admitting: Physical Therapy

## 2021-06-14 DIAGNOSIS — R262 Difficulty in walking, not elsewhere classified: Secondary | ICD-10-CM

## 2021-06-14 DIAGNOSIS — Z9889 Other specified postprocedural states: Secondary | ICD-10-CM

## 2021-06-14 DIAGNOSIS — R29898 Other symptoms and signs involving the musculoskeletal system: Secondary | ICD-10-CM

## 2021-06-14 NOTE — Therapy (Signed)
[facility] [address] [place], [place], [address] Phone: [phone]   Fax:  [phone]  Physical Therapy Treatment  Patient Details  Name: Meagan Oneill MRN: [id number] Date of Birth: 05-14-1990 Referring Provider (PT): Dr Dorene Grebe   Encounter Date: 06/14/2021   PT End of Session - 06/14/21 1728     Visit Number 6    Number of Visits 24    Date for PT Re-Evaluation 08/15/21    Authorization Type Self Pay    Progress Note Due on Visit 10    PT Start Time 1538    PT Stop Time 1620    PT Time Calculation (min) 42 min    Activity Tolerance Patient tolerated treatment well    Behavior During Therapy [facility] for tasks assessed/performed             Past Medical History:  Diagnosis Date   Anemia    Asthma    no inhaler   Environmental allergies    Hypertension     Past Surgical History:  Procedure Laterality Date   I & D EXTREMITY Left 05/13/2021   Procedure: DEBRIDEMENT LEFT KNEE;  Surgeon: Nadara Mustard, MD;  Location: [facility] OR;  Service: Orthopedics;  Laterality: Left;   KNEE ARTHROSCOPY WITH ANTERIOR CRUCIATE LIGAMENT (ACL) REPAIR WITH HAMSTRING GRAFT Left 03/25/2021   Procedure: left knee anterior cruciate ligament, posterior cruciate ligment, posterolateral corner reconstruction arthroscopy; allograft ligaments for reconstruction, meniscal debridement vs repair;  Surgeon: Cammy Copa, MD;  Location: [facility] OR;  Service: Orthopedics;  Laterality: Left;   WISDOM TOOTH EXTRACTION      There were no vitals filed for this visit.   Subjective Assessment - 06/14/21 1536     Subjective Pt states she continues to use her walker full time and her knee immobilizer.  States she was not feeling like coming today but pushed herself.  .  Currently 4/10 pain in her Lt knee today.    Currently in Pain? Yes    Pain Score 4     Pain Location Knee    Pain Orientation Left    Pain Descriptors / Indicators Sore                                [facility] Adult PT Treatment/Exercise - 06/14/21 0001       Ambulation/Gait   Ambulation Distance (Feet) 40 Feet    Assistive device Straight cane    Gait Pattern Step-to pattern    Pre-Gait Activities 5RT in parallel bars using Rt UE only, increasing WB and stance time on Lt.      Knee/Hip Exercises: Aerobic   Stationary Bike seat 18 full revolutions 3 minutes      Knee/Hip Exercises: Standing   Heel Raises 20 reps    Knee Flexion Left;5 reps;2 sets    Knee Flexion Limitations very weak, minimal 20 degrees    Functional Squat 10 reps    Functional Squat Limitations at // no brace    Rocker Board Limitations    Rocker Board Limitations Lt/Rt 1 HHA with therapist assist 20X      Knee/Hip Exercises: Supine   Straight Leg Raises Left;2 sets;10 reps    Knee Extension AROM    Knee Extension Limitations 0    Knee Flexion AROM    Knee Flexion Limitations 110  PT Short Term Goals - 06/01/21 1043       PT SHORT TERM GOAL #1   Title Patient will achieve and maintain a Lt knee AROM equal to that of the Rt knee.    Time 6    Period Weeks    Status On-going    Target Date 07/04/21      PT SHORT TERM GOAL #2   Title Patient will demonstrate a normalized gait during a and without an AD.    Time 6    Period Weeks    Status On-going    Target Date 07/04/21      PT SHORT TERM GOAL #3   Title Patient will have no swelling/pain following exercise.    Time 6    Period Weeks    Status On-going    Target Date 07/04/21               PT Long Term Goals - 06/01/21 1043       PT LONG TERM GOAL #1   Title Patient will report no episodes of instability during normal household tasks.    Time 12    Period Weeks    Status On-going    Target Date 08/15/21      PT LONG TERM GOAL #2   Title Patient will be able to evenly distribute BW through the LEs during 10 consecutive BW squats without pain and to a  depth of at least 60 degrees of knee flexion.    Time 12    Period Weeks    Status On-going    Target Date 08/15/21      PT LONG TERM GOAL #3   Title Lt quad extension strength will be 80% or greater of that of the Rt knee.    Time 12    Period Weeks    Status On-going    Target Date 08/15/21      PT LONG TERM GOAL #4   Title Lt quad flexion strength will be 80% or greater of that of the Rt knee.    Time 12    Period Weeks    Status On-going    Target Date 08/15/21      PT LONG TERM GOAL #5   Title Patient will be able to negotiate a normal flight of stairs(12-16 steps) reciprocally with the use of 1 hand rail and without an onset of Lt knee pain.    Time 12    Period Weeks    Status On-going    Target Date 08/15/21                   Plan - 06/14/21 1731     Clinical Impression Statement Pt comes today using RW with approx. 50% WB through the Lt LE.  Began with bike with ability to complete a full revolution with seat at 18, however pt stopped after a couple minutes due to fatigue and discomfort in her her knee.  Standing knee flexion only with approximately 20 degrees of flexion.  Noted heavy reliance on UEs with all activities.   Discussed importance of pushing herself with therapy in order to get her knee back without another injury.  Worked on gait with SPC with cues for step through not step to and instructed to acquire a cane and work on this at home.  PT able to complete SLR without extension lag and flexion improved to 110 degrees.  Communicated back to MD regarding need for hinge  brace vs no brace.  Knee immobilizer is worn out and preventing natural flexion with ambition.  Instructed pt to call MD tomorrow to discus this.   Pt weighed today on clinic scales at 375#    Personal Factors and Comorbidities Fitness    Examination-Activity Limitations Bathing;Bed Mobility;Bend;Caring for  Others;Carry;Dressing;Hygiene/Grooming;Lift;Toileting;Stand;Stairs;Squat;Sit;Locomotion Level;Transfers    Examination-Participation Restrictions Cleaning;Community Activity;Driving;Interpersonal Relationship;Shop;Yard Work;Occupation;Laundry    Stability/Clinical Decision Making Stable/Uncomplicated    Rehab Potential Fair    PT Frequency 2x / week    PT Duration 12 weeks    PT Treatment/Interventions ADLs/Self Care Home Management;DME Instruction;Electrical Stimulation;Neuromuscular re-education;Balance training;Therapeutic exercise;Therapeutic activities;Functional mobility training;Stair training;Gait training;Patient/family education;Manual techniques;Passive range of motion    PT Next Visit Plan continue to increase WB through Lt to normalize gait and improve stregth.    PT Home Exercise Plan Sitting Heel Slide with Towel - 3-4 x daily - 7 x weekly - 6 reps - 15s hold  Long Sitting Ankle Pumps - 5-6 x daily - 7 x weekly - 20 reps  Supine Quad Set - 2-3 x daily - 7 x weekly - 8 reps - 5s hold  Sidelying Hip Abduction - 2-3 x daily - 7 x weekly - 3 sets - 10 reps  Prone Knee Flexion - 2-3 x daily - 7 x weekly - 3 sets - 12 reps  Seated Knee Extension AROM - 2-3 x daily - 7 x weekly - 3 sets - 10 reps;  2/22 : SLR, prone hip extension , heel Raises minisquats.    Consulted and Agree with Plan of Care Patient             Patient will benefit from skilled therapeutic intervention in order to improve the following deficits and impairments:  Decreased range of motion, Difficulty walking, Decreased activity tolerance, Pain, Hypomobility, Decreased strength  Visit Diagnosis: S/P reconstruction of ligament of knee  Decreased strength involving knee joint  Difficulty in walking, not elsewhere classified     Problem List Patient Active Problem List   Diagnosis Date Noted   Dehiscence of incision, sequela    Open knee wound, left, sequela    Knee dislocation 04/05/2021   Severe obesity  (BMI >= 40) (HCC) 04/05/2021   Rupture of posterior cruciate ligament of left knee    Left anterior cruciate ligament tear    Acute lateral meniscus tear of left knee    Injury of posterolateral corner of knee, left, subsequent encounter    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 [place], [place], [address] Phone: [phone]   Fax:  [phone]  Name: Meagan Hoctor MRN: [id number] Date of Birth: 01-28-1991

## 2021-06-16 ENCOUNTER — Encounter (HOSPITAL_COMMUNITY): Payer: Self-pay

## 2021-06-16 ENCOUNTER — Ambulatory Visit (HOSPITAL_COMMUNITY): Payer: Self-pay

## 2021-06-16 ENCOUNTER — Other Ambulatory Visit: Payer: Self-pay

## 2021-06-16 DIAGNOSIS — Z9889 Other specified postprocedural states: Secondary | ICD-10-CM

## 2021-06-16 DIAGNOSIS — R29898 Other symptoms and signs involving the musculoskeletal system: Secondary | ICD-10-CM

## 2021-06-16 DIAGNOSIS — R262 Difficulty in walking, not elsewhere classified: Secondary | ICD-10-CM

## 2021-06-16 NOTE — Therapy (Signed)
?OUTPATIENT PHYSICAL THERAPY TREATMENT NOTE ? ? ?Patient Name: Meagan Oneill ?MRN: [id number] ?DOB:Nov 18, 1990, 31 y.o., female ?Today's Date: 06/16/2021 ? ?PCP: Patient, No Pcp Per (Inactive) ?REFERRING PROVIDER: Adonis Huguenin, NP ? ? PT End of Session - 06/16/21 1628   ? ? Visit Number 7   ? Number of Visits 24   ? Date for PT Re-Evaluation 08/15/21   ? Authorization Type Self Pay   ? Progress Note Due on Visit 10   ? PT Start Time 1621   ? PT Stop Time 1700   ? PT Time Calculation (min) 39 min   ? Equipment Utilized During Treatment Left knee immobilizer   removed during sessin  ? Activity Tolerance Patient tolerated treatment well   ? Behavior During Therapy [facility] for tasks assessed/performed   ? ?  ?  ? ?  ? ? ?Past Medical History:  ?Diagnosis Date  ? Anemia   ? Asthma   ? no inhaler  ? Environmental allergies   ? Hypertension   ? ?Past Surgical History:  ?Procedure Laterality Date  ? I & D EXTREMITY Left 05/13/2021  ? Procedure: DEBRIDEMENT LEFT KNEE;  Surgeon: Nadara Mustard, MD;  Location: [facility] OR;  Service: Orthopedics;  Laterality: Left;  ? KNEE ARTHROSCOPY WITH ANTERIOR CRUCIATE LIGAMENT (ACL) REPAIR WITH HAMSTRING GRAFT Left 03/25/2021  ? Procedure: left knee anterior cruciate ligament, posterior cruciate ligment, posterolateral corner reconstruction arthroscopy; allograft ligaments for reconstruction, meniscal debridement vs repair;  Surgeon: Cammy Copa, MD;  Location: [facility] OR;  Service: Orthopedics;  Laterality: Left;  ? WISDOM TOOTH EXTRACTION    ? ?Patient Active Problem List  ? Diagnosis Date Noted  ? Dehiscence of incision, sequela   ? Open knee wound, left, sequela   ? Knee dislocation 04/05/2021  ? Severe obesity (BMI >= 40) (HCC) 04/05/2021  ? Rupture of posterior cruciate ligament of left knee   ? Left anterior cruciate ligament tear   ? Acute lateral meniscus tear of left knee   ? Injury of posterolateral corner of knee, left, subsequent encounter   ? 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 S/P left knee surgery 03/25/2021  ? ? ?REFERRING DIAG: s/p Reconstruction of ligaments of knee(ACL/PCL/PLC)  ? ?THERAPY DIAG:  ?S/P reconstruction of ligament of knee ? ?Decreased strength involving knee joint ? ?Difficulty in walking, not elsewhere classified ? ?PERTINENT HISTORY: None  ? ?PRECAUTIONS: Knee ? ?SUBJECTIVE: Knee immobilizer continues to slide down leg, forgot to call MD concerning brace.  No reports of pain today. ? ?PAIN:  ?Are you having pain? No ? ? ? ?TODAY'S TREATMENT:  ? ? 06/16/21 0001  ?Ambulation/Gait  ?Ambulation Distance (Feet) 801 Feet  ?Assistive device Straight cane  ?Gait Pattern Step-through pattern  ?Gait Comments complete through session, good sequence  ?Exercises  ?Exercises Knee/Hip  ?Knee/Hip Exercises: Aerobic  ?Stationary Bike seat 17 full revolutions 3 minutes  ?Knee/Hip Exercises: Standing  ?Knee Flexion 10 reps  ?Knee Flexion Limitations very weak, minimal 20 degrees  ?Forward Step Up Hand Hold: 2;Step Height: 4";Both  ?Forward Step Up Limitations cueing to reduce UE support  ?Functional Squat 10 reps  ?Functional Squat Limitations at // no brace  ?Knee/Hip Exercises: Supine  ?Bridges 10 reps  ?Bridges Limitations 3" holds, press with heels prior raise  ?Knee/Hip Exercises: Prone  ?Hamstring Curl 2 sets;10 reps;5 seconds  ?Hamstring Curl Limitations 5" hold end range, 3" down  ?Hip Extension 10 reps  ? ? ? ?  HOME EXERCISE PROGRAM: ?Heel slide, ankle pumps, quad set, hip abd, prone: knee fleixon and hip extension, LAQ, SLR, heel raise, minisquat, bridge ? ? PT Short Term Goals - 06/01/21 1043   ? ?  ? PT SHORT TERM GOAL #1  ? Title Patient will achieve and maintain a Lt knee AROM equal to that of the Rt knee.   ? Time 6   ? Period Weeks   ? Status On-going   ? Target Date 07/04/21   ?  ? PT SHORT TERM GOAL #2  ? Title Patient will demonstrate a normalized gait during a and without an AD.   ? Time 6   ? Period Weeks   ? Status On-going   ? Target Date  07/04/21   ?  ? PT SHORT TERM GOAL #3  ? Title Patient will have no swelling/pain following exercise.   ? Time 6   ? Period Weeks   ? Status On-going   ? Target Date 07/04/21   ? ?  ?  ? ?  ? ? ? PT Long Term Goals - 06/01/21 1043   ? ?  ? PT LONG TERM GOAL #1  ? Title Patient will report no episodes of instability during normal household tasks.   ? Time 12   ? Period Weeks   ? Status On-going   ? Target Date 08/15/21   ?  ? PT LONG TERM GOAL #2  ? Title Patient will be able to evenly distribute BW through the LEs during 10 consecutive BW squats without pain and to a depth of at least 60 degrees of knee flexion.   ? Time 12   ? Period Weeks   ? Status On-going   ? Target Date 08/15/21   ?  ? PT LONG TERM GOAL #3  ? Title Lt quad extension strength will be 80% or greater of that of the Rt knee.   ? Time 12   ? Period Weeks   ? Status On-going   ? Target Date 08/15/21   ?  ? PT LONG TERM GOAL #4  ? Title Lt quad flexion strength will be 80% or greater of that of the Rt knee.   ? Time 12   ? Period Weeks   ? Status On-going   ? Target Date 08/15/21   ?  ? PT LONG TERM GOAL #5  ? Title Patient will be able to negotiate a normal flight of stairs(12-16 steps) reciprocally with the use of 1 hand rail and without an onset of Lt knee pain.   ? Time 12   ? Period Weeks   ? Status On-going   ? Target Date 08/15/21   ? ?  ?  ? ?  ? ? ? Plan - 06/16/21 1649   ? ? Clinical Impression Statement Added gluteal and hamstring exercises to POC.  Gait training wiht good sequence with SPC, no LOB and ability to increase cadence, cueing to equalize stride length and heel to toe mechanics.  Cueing with standing exercises for form and mechanics, encouraged to reduce UE support for increased WB-ing.  Pt limited by fatigue at EOS.   ? Personal Factors and Comorbidities Fitness   ? Examination-Activity Limitations Bathing;Bed Mobility;Bend;Caring for Others;Carry;Dressing;Hygiene/Grooming;Lift;Toileting;Stand;Stairs;Squat;Sit;Locomotion  Level;Transfers   ? Examination-Participation Restrictions Cleaning;Community Activity;Driving;Interpersonal Relationship;Shop;Yard Work;Occupation;Laundry   ? Stability/Clinical Decision Making Stable/Uncomplicated   ? Clinical Decision Making Low   ? Rehab Potential Fair   ? PT Frequency 2x / week   ?  PT Duration 12 weeks   ? PT Treatment/Interventions ADLs/Self Care Home Management;DME Instruction;Electrical Stimulation;Neuromuscular re-education;Balance training;Therapeutic exercise;Therapeutic activities;Functional mobility training;Stair training;Gait training;Patient/family education;Manual techniques;Passive range of motion   ? PT Next Visit Plan continue to increase WB through Lt to normalize gait and improve stregth.   ? PT Home Exercise Plan Sitting Heel Slide with Towel - 3-4 x daily - 7 x weekly - 6 reps - 15s hold  Long Sitting Ankle Pumps - 5-6 x daily - 7 x weekly - 20 reps  Supine Quad Set - 2-3 x daily - 7 x weekly - 8 reps - 5s hold  Sidelying Hip Abduction - 2-3 x daily - 7 x weekly - 3 sets - 10 reps  Prone Knee Flexion - 2-3 x daily - 7 x weekly - 3 sets - 12 reps  Seated Knee Extension AROM - 2-3 x daily - 7 x weekly - 3 sets - 10 reps;  2/22 : SLR, prone hip extension , heel Raises minisquats.3/9: prone hamstring curls and hip extension, bridge   ? Consulted and Agree with Plan of Care Patient   ? ?  ?  ? ?  ? ? ?Becky Saxasey Joaovictor Krone, LPTA/CLT; CBIS ?[phone] ? ? ?Juel Burrowockerham, Claudette Wermuth Jo, PTA ?06/16/2021, 7:11 PM ? ?  ? ?

## 2021-06-16 NOTE — Patient Instructions (Addendum)
Hip Extension: Prone ? ? ? ?Tighten gluteal muscle. Lift one leg ___ times. Restabilize pelvis. Repeat with other leg. Keep pelvis still.  ?Be sure pelvis does not rotate and back does not arch. ?Do 2 sets, 10 times per day. ? ?http://ss.exer.us/65  ? ?Copyright ? V[employer]. All rights reserved.  ? ?Prone Knee Flexion ? ? ? ?Bend right knee, bringing heel toward buttocks. Hold 5 seconds, then straighten.  ?Repeat 20 times. Do 2 sessions per day. ? ?http://gt2.exer.us/307  ? ?Copyright ? [employer]. All rights reserved.  ? ?Bridge ? ? ? ?Lie back, legs bent. Inhale, pressing hips up. Keeping ribs in, lengthen lower back. Exhale, rolling down along spine from top. ?Repeat 10 times. Do 2 sessions per day. ? ?http://pm.exer.us/55  ? ?Copyright ? [employer]. All rights reserved.  ? ? ? ? ? ?

## 2021-06-17 ENCOUNTER — Encounter: Payer: Self-pay | Admitting: Orthopedic Surgery

## 2021-06-18 NOTE — Telephone Encounter (Signed)
Okay for B[place]soe hinged type knee brace.  She may have to have a custom brace because of the shape of her leg.  I do not know if this is going to be possible but we can try.

## 2021-06-21 ENCOUNTER — Other Ambulatory Visit: Payer: Self-pay

## 2021-06-21 ENCOUNTER — Ambulatory Visit (HOSPITAL_COMMUNITY): Payer: Self-pay | Admitting: Physical Therapy

## 2021-06-21 ENCOUNTER — Encounter (HOSPITAL_COMMUNITY): Payer: Self-pay | Admitting: Physical Therapy

## 2021-06-21 DIAGNOSIS — Z9889 Other specified postprocedural states: Secondary | ICD-10-CM

## 2021-06-21 DIAGNOSIS — R29898 Other symptoms and signs involving the musculoskeletal system: Secondary | ICD-10-CM

## 2021-06-21 DIAGNOSIS — R262 Difficulty in walking, not elsewhere classified: Secondary | ICD-10-CM

## 2021-06-21 NOTE — Therapy (Signed)
[place] [facility] ?[address] ?[place], [place], [address] ?Phone: [phone]   Fax:  [phone] ? ?Physical Therapy Treatment ? ?Patient Details  ?Name: Meagan Oneill ?MRN: [id number] ?Date of Birth: 07/27/1990 ?Referring Provider (PT): Dr Dorene GrebeScott Dean ? ? ?Encounter Date: 06/21/2021 ? ? PT End of Session - 06/21/21 1351   ? ? Visit Number 8   ? Number of Visits 24   ? Date for PT Re-Evaluation 08/15/21   ? Authorization Type Self Pay   ? Progress Note Due on Visit 10   ? PT Start Time 1348   ? PT Stop Time 1430   ? PT Time Calculation (min) 42 min   ? Equipment Utilized During Treatment Left knee immobilizer   removed during sessin  ? Activity Tolerance Patient tolerated treatment well   ? [facility] assessed/performed   ? ?  ?  ? ?  ? ? ?Past Medical History:  ?Diagnosis Date  ? Anemia   ? Asthma   ? no inhaler  ? Environmental allergies   ? Hypertension   ? ? ?Past Surgical History:  ?Procedure Laterality Date  ? I & D EXTREMITY Left 05/13/2021  ? Procedure: DEBRIDEMENT LEFT KNEE;  Surgeon: Nadara Mustarduda, Marcus V, MD;  [facility];  Service: Orthopedics;  Laterality: Left;  ? KNEE ARTHROSCOPY WITH ANTERIOR CRUCIATE LIGAMENT (ACL) REPAIR WITH HAMSTRING GRAFT Left 03/25/2021  ? Procedure: left knee anterior cruciate ligament, posterior cruciate ligment, posterolateral corner reconstruction arthroscopy; allograft ligaments for reconstruction, meniscal debridement vs repair;  Surgeon: Cammy Copaean, Gregory Scott, MD;  [facility];  Service: Orthopedics;  Laterality: Left;  ? WISDOM TOOTH EXTRACTION    ? ? ?There were no vitals filed for this visit. ? ? Subjective Assessment - 06/21/21 1352   ? ? Subjective Patient reports that her Lt knee is doing well in regard to pain level, but her Rt knee is starting to hurt from having to offload the LLE.   ? Currently in Pain? Yes   ? Pain Score 2    ? Pain Location Knee   ? Pain Orientation Left   ? Pain Type Surgical pain    ? Multiple Pain Sites Yes   ? Pain Score 5   ? Pain Location Knee   ? Pain Orientation Right   ? Pain Descriptors / Indicators Aching   ? Pain Type Acute pain   ? Pain Onset In the past 7 days   ? Pain Frequency Intermittent   ? Aggravating Factors  Walking or standing   ? Pain Relieving Factors Sitting   ? ?  ?  ? ?  ? ? ? ? ? ? ? ? ? ? ? ? ? ? ? ? ? ? ? ? [facility] Adult PT Treatment/Exercise - 06/21/21 0001   ? ?  ? Ambulation/Gait  ? Ambulation Distance (Feet) 310 Feet   ? Assistive device Straight cane   ? Gait Pattern Step-through pattern   ? Gait Comments cueing for terminal knee extension and even step cadence   ?  ? Knee/Hip Exercises: Aerobic  ? Stationary Bike seat 17 full revolutions 4 minutes   ?  ? Knee/Hip Exercises: Standing  ? Terminal Knee Extension Other (comment)   #2 2x8x5s holds(band placed PROXIMAL to the knee joint) w/ 50/50 weight bearing  ? Other Standing Knee Exercises SLS 3 vector LE reach w/ dual band UE assist 1x8   ?  ? Knee/Hip Exercises:  Seated  ? Other Seated Knee/Hip Exercises Seated calf raises w/ 20lbs 3x20   ? ?  ?  ? ?  ? ? ? ? ? ? ? ? ? ? ? ? PT Short Term Goals - 06/01/21 1043   ? ?  ? PT SHORT TERM GOAL #1  ? Title Patient will achieve and maintain a Lt knee AROM equal to that of the Rt knee.   ? Time 6   ? Period Weeks   ? Status On-going   ? Target Date 07/04/21   ?  ? PT SHORT TERM GOAL #2  ? Title Patient will demonstrate a normalized gait during a and without an AD.   ? Time 6   ? Period Weeks   ? Status On-going   ? Target Date 07/04/21   ?  ? PT SHORT TERM GOAL #3  ? Title Patient will have no swelling/pain following exercise.   ? Time 6   ? Period Weeks   ? Status On-going   ? Target Date 07/04/21   ? ?  ?  ? ?  ? ? ? ? PT Long Term Goals - 06/01/21 1043   ? ?  ? PT LONG TERM GOAL #1  ? Title Patient will report no episodes of instability during normal household tasks.   ? Time 12   ? Period Weeks   ? Status On-going   ? Target Date 08/15/21   ?  ? PT LONG TERM GOAL  #2  ? Title Patient will be able to evenly distribute BW through the LEs during 10 consecutive BW squats without pain and to a depth of at least 60 degrees of knee flexion.   ? Time 12   ? Period Weeks   ? Status On-going   ? Target Date 08/15/21   ?  ? PT LONG TERM GOAL #3  ? Title Lt quad extension strength will be 80% or greater of that of the Rt knee.   ? Time 12   ? Period Weeks   ? Status On-going   ? Target Date 08/15/21   ?  ? PT LONG TERM GOAL #4  ? Title Lt quad flexion strength will be 80% or greater of that of the Rt knee.   ? Time 12   ? Period Weeks   ? Status On-going   ? Target Date 08/15/21   ?  ? PT LONG TERM GOAL #5  ? Title Patient will be able to negotiate a normal flight of stairs(12-16 steps) reciprocally with the use of 1 hand rail and without an onset of Lt knee pain.   ? Time 12   ? Period Weeks   ? Status On-going   ? Target Date 08/15/21   ? ?  ?  ? ?  ? ? ? ? ? ? ? ? Plan - 06/21/21 1444   ? ? Clinical Impression Statement Patient had a mixed tolerance of today's exercises today and appeared to be anxious about weightbearing exercises. Gait with SPC use and without the knee immobilizer was practiced during today's session and the patient still has a tendency to avoid terminal knee extension due to the sensation of the original injury. Full knee extension is available and was demonstrated during the TKE exercise however. Low resistance terminal knee extensions were performed during which the band was placed proximal to the knee joint in order to avoid any addiitonal shear force to the Lt tibia. Her anxiety for the  TKE exercise decreased substantially and she was able to place an even amount of weight through her LEs by the final set. The band assisted SLS with 3 vector reaching acitvity caused the patient anxiety for the duration of the acitvity in addition to increased pain. This activity was ceased once the patient reported the increase in pain. Knee immobilizer was placed back on the  LLE prior to departure.   ? Personal Factors and Comorbidities Fitness   ? Examination-Activity Limitations Bathing;Bed Mobility;Bend;Caring for Others;Carry;Dressing;Hygiene/Grooming;Lift;Toileting;Stand;Stairs;Squat;Sit;Locomotion Level;Transfers   ? Examination-Participation Restrictions Cleaning;Community Activity;Driving;Interpersonal Relationship;Shop;Yard Work;Occupation;Laundry   ? Stability/Clinical Decision Making Stable/Uncomplicated   ? Rehab Potential Fair   ? PT Frequency 2x / week   ? PT Duration 12 weeks   ? PT Treatment/Interventions ADLs/Self Care Home Management;DME Instruction;Electrical Stimulation;Neuromuscular re-education;Balance training;Therapeutic exercise;Therapeutic activities;Functional mobility training;Stair training;Gait training;Patient/family education;Manual techniques;Passive range of motion   ? PT Next Visit Plan continue to increase WB through Lt to normalize gait and improve stregth.   ? PT Home Exercise Plan Sitting Heel Slide with Towel - 3-4 x daily - 7 x weekly - 6 reps - 15s hold  Long Sitting Ankle Pumps - 5-6 x daily - 7 x weekly - 20 reps  Supine Quad Set - 2-3 x daily - 7 x weekly - 8 reps - 5s hold  Sidelying Hip Abduction - 2-3 x daily - 7 x weekly - 3 sets - 10 reps  Prone Knee Flexion - 2-3 x daily - 7 x weekly - 3 sets - 12 reps  Seated Knee Extension AROM - 2-3 x daily - 7 x weekly - 3 sets - 10 reps;  2/22 : SLR, prone hip extension , heel Raises minisquats.3/9: prone hamstring curls and hip extension, bridge   ? Consulted and Agree with Plan of Care Patient   ? ?  ?  ? ?  ? ? ?Patient will benefit from skilled therapeutic intervention in order to improve the following deficits and impairments:  Decreased range of motion, Difficulty walking, Decreased activity tolerance, Pain, Hypomobility, Decreased strength ? ?Visit Diagnosis: ?S/P reconstruction of ligament of knee ? ?Decreased strength involving knee joint ? ?Difficulty in walking, not elsewhere  classified ? ? ? ? ?Problem List ?Patient Active Problem List  ? Diagnosis Date Noted  ? Dehiscence of incision, sequela   ? Open knee wound, left, sequela   ? Knee dislocation 04/05/2021  ? Severe obesity (BMI >

## 2021-06-23 ENCOUNTER — Ambulatory Visit (HOSPITAL_COMMUNITY): Payer: Self-pay | Admitting: Physical Therapy

## 2021-06-24 ENCOUNTER — Encounter (HOSPITAL_COMMUNITY): Payer: Self-pay | Admitting: Orthopedic Surgery

## 2021-06-28 ENCOUNTER — Ambulatory Visit (HOSPITAL_COMMUNITY): Payer: Self-pay | Admitting: Physical Therapy

## 2021-06-28 ENCOUNTER — Other Ambulatory Visit: Payer: Self-pay

## 2021-06-28 DIAGNOSIS — Z9889 Other specified postprocedural states: Secondary | ICD-10-CM | POA: Diagnosis not present

## 2021-06-28 DIAGNOSIS — R29898 Other symptoms and signs involving the musculoskeletal system: Secondary | ICD-10-CM

## 2021-06-28 DIAGNOSIS — R262 Difficulty in walking, not elsewhere classified: Secondary | ICD-10-CM

## 2021-06-28 NOTE — Therapy (Signed)
[place] ?[facility] ?[address] ?[place], [place], [address] ?Phone: [phone]   Fax:  [phone] ? ?Physical Therapy Treatment ? ?Patient Details  ?Name: Meagan Oneill ?MRN: [id number] ?Date of Birth: 04-Oct-1990 ?Referring Provider (PT): Dr Alphonzo Severance ? ? ?Encounter Date: 06/28/2021 ? ? PT End of Session - 06/28/21 1455   ? ? Visit Number 9   ? Number of Visits 24   ? Date for PT Re-Evaluation 08/15/21   ? Authorization Type Self Pay   ? Progress Note Due on Visit 10   ? PT Start Time 1452   ? PT Stop Time 1515   ? PT Time Calculation (min) 23 min   ? Equipment Utilized During Treatment Left knee immobilizer   removed during sessin  ? Activity Tolerance Patient tolerated treatment well   ? Behavior During Therapy [facility] for tasks assessed/performed   ? ?  ?  ? ?  ? ? ?Past Medical History:  ?Diagnosis Date  ? Anemia   ? Asthma   ? no inhaler  ? Environmental allergies   ? Hypertension   ? ? ?Past Surgical History:  ?Procedure Laterality Date  ? I & D EXTREMITY Left 05/13/2021  ? Procedure: DEBRIDEMENT LEFT KNEE;  Surgeon: Newt Minion, MD;  Location: [facility];  Service: Orthopedics;  Laterality: Left;  ? KNEE ARTHROSCOPY WITH ANTERIOR CRUCIATE LIGAMENT (ACL) REPAIR WITH HAMSTRING GRAFT Left 03/25/2021  ? Procedure: left knee anterior cruciate ligament, posterior cruciate ligment, posterolateral corner reconstruction arthroscopy; allograft ligaments for reconstruction, meniscal debridement vs repair;  Surgeon: Meredith Pel, MD;  Location: [facility];  Service: Orthopedics;  Laterality: Left;  ? WISDOM TOOTH EXTRACTION    ? ? ?There were no vitals filed for this visit. ? ? Subjective Assessment - 06/28/21 1524   ? ? Subjective Paitne reports that she has not been wearing the knee immobilizer at home, but does wear it when she is out in the community.   ? Currently in Pain? Yes   ? Pain Score 2    ? Pain Location Knee   ? Pain Orientation Left   ? Pain Onset In the past 7 days   ? ?  ?   ? ?  ? ? ? ? ? ? ? ? ? ? ? ? ? ? ? ? ? ? ? ? [facility] Adult PT Treatment/Exercise - 06/28/21 0001   ? ?  ? Ambulation/Gait  ? Ambulation Distance (Feet) 445 Feet   ? Assistive device Straight cane   ? Gait Pattern Step-through pattern   ? Gait Comments cueing for even step cadence   ?  ? Knee/Hip Exercises: Aerobic  ? Stationary Bike seat 14 full revolutions 5 minutes   ?  ? Knee/Hip Exercises: Seated  ? Other Seated Knee/Hip Exercises Seated deficit calf raises w/ 20lbs 3x20   ? ?  ?  ? ?  ? ? ? ? ? ? ? ? ? ? PT Education - 06/28/21 1529   ? ? Education Details Gait mechanics, which environments still require the use of the knee immobilizer, and recommended types of canes.   ? Person(s) Educated Patient   ? Methods Explanation   ? Comprehension Verbalized understanding   ? ?  ?  ? ?  ? ? ? PT Short Term Goals - 06/01/21 1043   ? ?  ? PT SHORT TERM GOAL #1  ? Title Patient will achieve and maintain a Lt knee AROM equal to  that of the Rt knee.   ? Time 6   ? Period Weeks   ? Status On-going   ? Target Date 07/04/21   ?  ? PT SHORT TERM GOAL #2  ? Title Patient will demonstrate a normalized gait during a 2MWT and without an AD.   ? Time 6   ? Period Weeks   ? Status On-going   ? Target Date 07/04/21   ?  ? PT SHORT TERM GOAL #3  ? Title Patient will have no swelling/pain following exercise.   ? Time 6   ? Period Weeks   ? Status On-going   ? Target Date 07/04/21   ? ?  ?  ? ?  ? ? ? ? PT Long Term Goals - 06/01/21 1043   ? ?  ? PT LONG TERM GOAL #1  ? Title Patient will report no episodes of instability during normal household tasks.   ? Time 12   ? Period Weeks   ? Status On-going   ? Target Date 08/15/21   ?  ? PT LONG TERM GOAL #2  ? Title Patient will be able to evenly distribute BW through the LEs during 10 consecutive BW squats without pain and to a depth of at least 60 degrees of knee flexion.   ? Time 12   ? Period Weeks   ? Status On-going   ? Target Date 08/15/21   ?  ? PT LONG TERM GOAL #3  ? Title Lt quad  extension strength will be 80% or greater of that of the Rt knee.   ? Time 12   ? Period Weeks   ? Status On-going   ? Target Date 08/15/21   ?  ? PT LONG TERM GOAL #4  ? Title Lt quad flexion strength will be 80% or greater of that of the Rt knee.   ? Time 12   ? Period Weeks   ? Status On-going   ? Target Date 08/15/21   ?  ? PT LONG TERM GOAL #5  ? Title Patient will be able to negotiate a normal flight of stairs(12-16 steps) reciprocally with the use of 1 hand rail and without an onset of Lt knee pain.   ? Time 12   ? Period Weeks   ? Status On-going   ? Target Date 08/15/21   ? ?  ?  ? ?  ? ? ? ? ? ? ? ? Plan - 06/28/21 1526   ? ? Clinical Impression Statement Patient toelrated treatment session well and an increase in ambulation tolerance was noted. Knee extension during gait has improved, but the gradual loading of the LLE is still present and noticeable compared to the RLE. Of note, the patient was ~18 minutes late to today's appointment so a shortened session was needed.   ? Personal Factors and Comorbidities Fitness   ? Examination-Activity Limitations Bathing;Bed Mobility;Bend;Caring for Others;Carry;Dressing;Hygiene/Grooming;Lift;Toileting;Stand;Stairs;Squat;Sit;Locomotion Level;Transfers   ? Examination-Participation Restrictions Cleaning;Community Activity;Driving;Interpersonal Relationship;Shop;Yard Work;Occupation;Laundry   ? Stability/Clinical Decision Making Stable/Uncomplicated   ? Rehab Potential Fair   ? PT Frequency 2x / week   ? PT Duration 12 weeks   ? PT Treatment/Interventions ADLs/Self Care Home Management;DME Instruction;Electrical Stimulation;Neuromuscular re-education;Balance training;Therapeutic exercise;Therapeutic activities;Functional mobility training;Stair training;Gait training;Patient/family education;Manual techniques;Passive range of motion   ? PT Next Visit Plan Perform progress note during the next session. continue to increase WB through Lt to normalize gait and improve  strength.   ? PT Home Exercise Plan  Sitting Heel Slide with Towel - 3-4 x daily - 7 x weekly - 6 reps - 15s hold  Long Sitting Ankle Pumps - 5-6 x daily - 7 x weekly - 20 reps  Supine Quad Set - 2-3 x daily - 7 x weekly - 8 reps - 5s hold  Sidelying Hip Abduction - 2-3 x daily - 7 x weekly - 3 sets - 10 reps  Prone Knee Flexion - 2-3 x daily - 7 x weekly - 3 sets - 12 reps  Seated Knee Extension AROM - 2-3 x daily - 7 x weekly - 3 sets - 10 reps;  2/22 : SLR, prone hip extension , heel Raises minisquats.3/9: prone hamstring curls and hip extension, bridge   ? Consulted and Agree with Plan of Care Patient   ? ?  ?  ? ?  ? ? ?Patient will benefit from skilled therapeutic intervention in order to improve the following deficits and impairments:  Decreased range of motion, Difficulty walking, Decreased activity tolerance, Pain, Hypomobility, Decreased strength ? ?Visit Diagnosis: ?S/P reconstruction of ligament of knee ? ?Decreased strength involving knee joint ? ?Difficulty in walking, not elsewhere classified ? ? ? ? ?Problem List ?Patient Active Problem List  ? Diagnosis Date Noted  ? Dehiscence of incision, sequela   ? Open knee wound, left, sequela   ? Knee dislocation 04/05/2021  ? Severe obesity (BMI >= 40) ([place]) 04/05/2021  ? Rupture of posterior cruciate ligament of left knee   ? Left anterior cruciate ligament tear   ? Acute lateral meniscus tear of left knee   ? Injury of posterolateral corner of knee, left, subsequent encounter   ? 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 S/P left knee surgery 03/25/2021  ? ? ?Adalberto Cole, PT ?06/28/2021, 3:30 PM ? ?[place] ?[place] ?[address] ?[place], [place], [address] ?Phone: [phone]   Fax:  [phone] ? ?Name: Meagan Oneill ?MRN: [id number] ?Date of Birth: 1991-02-10 ? ? ? ?

## 2021-06-30 ENCOUNTER — Other Ambulatory Visit: Payer: Self-pay

## 2021-06-30 ENCOUNTER — Ambulatory Visit (HOSPITAL_COMMUNITY): Payer: Self-pay | Admitting: Physical Therapy

## 2021-06-30 ENCOUNTER — Encounter (HOSPITAL_COMMUNITY): Payer: Self-pay | Admitting: Physical Therapy

## 2021-06-30 DIAGNOSIS — Z9889 Other specified postprocedural states: Secondary | ICD-10-CM | POA: Diagnosis not present

## 2021-06-30 DIAGNOSIS — R262 Difficulty in walking, not elsewhere classified: Secondary | ICD-10-CM

## 2021-06-30 DIAGNOSIS — R29898 Other symptoms and signs involving the musculoskeletal system: Secondary | ICD-10-CM

## 2021-06-30 NOTE — Therapy (Signed)
?OUTPATIENT PHYSICAL THERAPY TREATMENT NOTE ? ? ?Patient Name: Meagan Oneill ?MRN: [id number] ?DOB:04/11/1990, 31 y.o., female ?Today's Date: 06/30/2021 ? ?PCP: Patient, No Pcp Per (Inactive) ?REFERRING PROVIDER: Adonis HugueninZamora, Erin R, NP ? ? PT End of Session - 06/30/21 1514   ? ? Visit Number 10   ? Number of Visits 24   ? Date for PT Re-Evaluation 08/15/21   ? Authorization Type Self Pay   ? Progress Note Due on Visit 10   ? PT Start Time 1619  ? PT Stop Time 1700   ? PT Time Calculation (min) 41 min   ? Equipment Utilized During Treatment Left knee immobilizer   removed during sessin  ? Activity Tolerance Patient tolerated treatment well   ? Behavior [facility] for tasks assessed/performed   ? ?  ?  ? ?  ? ? ?Past Medical History:  ?Diagnosis Date  ? Anemia   ? Asthma   ? no inhaler  ? Environmental allergies   ? Hypertension   ? ?Past Surgical History:  ?Procedure Laterality Date  ? I & D EXTREMITY Left 05/13/2021  ? Procedure: DEBRIDEMENT LEFT KNEE;  Surgeon: Nadara Mustarduda, Marcus V, MD;  [facility];  Service: Orthopedics;  Laterality: Left;  ? KNEE ARTHROSCOPY WITH ANTERIOR CRUCIATE LIGAMENT (ACL) REPAIR WITH HAMSTRING GRAFT Left 03/25/2021  ? Procedure: left knee anterior cruciate ligament, posterior cruciate ligment, posterolateral corner reconstruction arthroscopy; allograft ligaments for reconstruction, meniscal debridement vs repair;  Surgeon: Cammy Copaean, Gregory Scott, MD;  [facility];  Service: Orthopedics;  Laterality: Left;  ? WISDOM TOOTH EXTRACTION    ? ?Patient Active Problem List  ? Diagnosis Date Noted  ? Dehiscence of incision, sequela   ? Open knee wound, left, sequela   ? Knee dislocation 04/05/2021  ? Severe obesity (BMI >= 40) (HCC) 04/05/2021  ? Rupture of posterior cruciate ligament of left knee   ? Left anterior cruciate ligament tear   ? Acute lateral meniscus tear of left knee   ? Injury of posterolateral corner of knee, left, subsequent encounter   ? 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 S/P left knee surgery 03/25/2021  ? ? ?REFERRING DIAG: S/P reconstruction of ligament of knee ? ?Decreased strength involving knee joint ? ?Difficulty in walking, not elsewhere classified ? ?THERAPY DIAG:  ?S/P reconstruction of ligament of knee ? ?Decreased strength involving knee joint ? ?Difficulty in walking, not elsewhere classified ? ?PERTINENT HISTORY: unremarkable  ? ?PRECAUTIONS: fall ? ?SUBJECTIVE: Pt states that she made dinner last night she was up for an hour and a half.  Noted increased pain.  ? ?PAIN:  ?Are you having pain? Yes: NPRS scale: 4/10 ?Pain location: Lt knee  ?Pain description: pulling  ?Aggravating factors: being up to long  ?Relieving factors: tylenol  ? ? ?Objective:   ? ?ROM:  LT: 0-120  RT 0-125  ? ?Strength:  ?Lt hip flexion:           5/5 ?LT hip extension:     3+/5  ?Lt hip abduction:      3+/5 ?Lt hip adduction:      3+/5 ? ?Lt Knee extension:  4/5 ?Lt knee flexion:       3+/5 ? ?Single leg stance: ?Rt:  4"  ?LT:  1" ? ?Five time sit to stand:  22.49 needs cuing to keep wt even  ? ?Exercises:  Side lying abduction  4# x 10; Hip adduction 2# x 10  ?  Prone ham curl 2# x 10  ?                   Prone hip extension 2# LT ; 4# rt x 10  ? ?                   Standing:  wall sit x 15" x 5  ?                                    Heel raises x 15 ?                                    Squat x 15 ?                    ?  PT Education - 06/28/21 1529   ?  ?  Education Details The need to work on hip extension and knee flexion exercises   ?  Person(s) Educated Patient   ?  Methods Explanation   ?  Comprehension Verbalized understanding   ?  ?   ?  ?  ?   ?  ?  ?  PT Short Term Goals - 06/01/21 1043   ?  ?    ?     ?  PT SHORT TERM GOAL #1  ?  Title Patient will achieve and maintain a Lt knee AROM equal to that of the Rt knee.   ?  Time 6   ?  Period Weeks   ?  Status Achieved   ?  Target Date 07/04/21   ?     ?  PT SHORT TERM GOAL #2  ?  Title Patient will  demonstrate a normalized gait during a and without an AD.   ?  Time 6   ?  Period Weeks   ?  Status On-going   ?  Target Date 07/04/21   ?     ?  PT SHORT TERM GOAL #3  ?  Title Patient will have no swelling/pain following exercise.   ?  Time 6   ?  Period Weeks   ?  Status On-going   ?  Target Date 07/04/21   ?  ?   ?  ?  ?   ?  ?  ?  ?  PT Long Term Goals - 06/01/21 1043   ?  ?    ?     ?  PT LONG TERM GOAL #1  ?  Title Patient will report no episodes of instability during normal household tasks.   ?  Time 12   ?  Period Weeks   ?  Status Achieved   ?  Target Date 08/15/21   ?     ?  PT LONG TERM GOAL #2  ?  Title Patient will be able to evenly distribute BW through the LEs during 10 consecutive BW squats without pain and to a depth of at least 60 degrees of knee flexion.   ?  Time 12   ?  Period Weeks   ?  Status On-going   ?  Target Date 08/15/21   ?     ?  PT LONG TERM GOAL #3  ?  Title Lt quad extension strength will be 80% or greater of  that of the Rt knee.   ?  Time 12   ?  Period Weeks   ?  Status On-going   ?  Target Date 08/15/21   ?     ?  PT LONG TERM GOAL #4  ?  Title Lt quad flexion strength will be 80% or greater of that of the Rt knee.   ?  Time 12   ?  Period Weeks   ?  Status On-going   ?  Target Date 08/15/21   ?     ?  PT LONG TERM GOAL #5  ?  Title Patient will be able to negotiate a normal flight of stairs(12-16 steps) reciprocally with the use of 1 hand rail and without an onset of Lt knee pain.   ?  Time 12   ?  Period Weeks   ?  Status On-going   ?  Target Date 08/15/21   ?  ?   ?  ?  ?   ?  ?  ?  ?  ?  ?  ?  ?  Plan - 06/30/21   ?  ?  Clinical Impression Statement PT is approximately 14 weeks post op.  She is here for her 10th visit and a reassessment.  Pt has good ROM but continues to have weakness in her left hip and knee.  Pt will continue to benefit from skilled PT .  ?  Personal Factors and Comorbidities Fitness   ?  Examination-Activity Limitations Bathing;Bed  Mobility;Bend;Caring for Others;Carry;Dressing;Hygiene/Grooming;Lift;Toileting;Stand;Stairs;Squat;Sit;Locomotion Level;Transfers   ?  Examination-Participation Restrictions Cleaning;Community Activity;Driving;Interpersonal Relationship;Shop;Yard Work;Occupation;Laundry   ?  Stability/Clinical Decision Making Stable/Uncomplicated   ?  Rehab Potential Fair   ?  PT Frequency 2x / week   ?  PT Duration 12 weeks   ?  PT Treatment/Interventions ADLs/Self Care Home Management;DME Instruction;Electrical Stimulation;Neuromuscular re-education;Balance training;Therapeutic exercise;Therapeutic activities;Functional mobility training;Stair training;Gait training;Patient/family education;Manual techniques;Passive range of motion   ?  PT Next Visit Plan Perform progress note during the next session. continue to increase WB through Lt to normalize gait and improve strength.   ?  PT Home Exercise Plan Sitting Heel Slide with Towel - 3-4 x daily - 7 x weekly - 6 reps - 15s hold  Long Sitting Ankle Pumps - 5-6 x daily - 7 x weekly - 20 reps  Supine Quad Set - 2-3 x daily - 7 x weekly - 8 reps - 5s hold  Sidelying Hip Abduction - 2-3 x daily - 7 x weekly - 3 sets - 10 reps  Prone Knee Flexion - 2-3 x daily - 7 x weekly - 3 sets - 12 reps  Seated Knee Extension AROM - 2-3 x daily - 7 x weekly - 3 sets - 10 reps;  2/22 : SLR, prone hip extension , heel Raises minisquats.3/9: prone hamstring curls and hip extension, bridge   ?  Consulted and Agree with Plan of Care Patient   ?  ?   ?  ?  ?   ?  ?  ?Patient will benefit from skilled therapeutic intervention in order to improve the following deficits and impairments:  Decreased range of motion, Difficulty walking, Decreased activity tolerance, Pain, Hypomobility, Decreased strength ?  ?Visit Diagnosis: ?S/P reconstruction of ligament of knee ?  ?Decreased strength involving knee joint ?  ?Difficulty in walking, not elsewhere classified ?  ?  ?  ?  ?Problem List ?    ?Patient Active Problem  List  ?  Diagnosis Date Noted  ? Dehiscence of incision, sequela    ? Open knee wound, left, sequela    ? Knee dislocation 04/05/2021  ? Severe obesity (BMI >= 40) (HCC) 04/05/2021  ? Rupture of posterior cruciate ligament

## 2021-07-04 ENCOUNTER — Other Ambulatory Visit: Payer: Self-pay

## 2021-07-04 ENCOUNTER — Ambulatory Visit (INDEPENDENT_AMBULATORY_CARE_PROVIDER_SITE_OTHER): Payer: Worker's Compensation | Admitting: Surgical

## 2021-07-04 DIAGNOSIS — Z9889 Other specified postprocedural states: Secondary | ICD-10-CM

## 2021-07-05 ENCOUNTER — Other Ambulatory Visit: Payer: Self-pay

## 2021-07-05 ENCOUNTER — Ambulatory Visit (HOSPITAL_COMMUNITY): Payer: Self-pay | Admitting: Physical Therapy

## 2021-07-05 ENCOUNTER — Encounter (HOSPITAL_COMMUNITY): Payer: Self-pay | Admitting: Physical Therapy

## 2021-07-05 DIAGNOSIS — Z9889 Other specified postprocedural states: Secondary | ICD-10-CM

## 2021-07-05 DIAGNOSIS — R262 Difficulty in walking, not elsewhere classified: Secondary | ICD-10-CM

## 2021-07-05 DIAGNOSIS — R29898 Other symptoms and signs involving the musculoskeletal system: Secondary | ICD-10-CM

## 2021-07-05 NOTE — Therapy (Signed)
?OUTPATIENT PHYSICAL THERAPY TREATMENT NOTE ? ? ?Patient Name: Meagan Oneill ?MRN: [id number] ?DOB:03/31/1991, 31 y.o., female ?Today's Date: 07/05/2021 ? ?PCP: Patient, No Pcp Per (Inactive) ?REFERRING PROVIDER: Adonis HugueninZamora, Erin R, NP ? ? PT End of Session - 07/05/21 1443   ? ? Visit Number 11   ? Number of Visits 24   ? Date for PT Re-Evaluation 08/15/21   ? Authorization Type Self Pay   ? Progress Note Due on Visit 10   ? PT Start Time 1440   ? PT Stop Time 1516   ? PT Time Calculation (min) 36 min   ? Equipment Utilized During Treatment Left knee immobilizer   removed during sessin  ? Activity Tolerance Patient tolerated treatment well   ? Behavior [facility] for tasks assessed/performed   ? ?  ?  ? ?  ? ? ?Past Medical History:  ?Diagnosis Date  ? Anemia   ? Asthma   ? no inhaler  ? Environmental allergies   ? Hypertension   ? ?Past Surgical History:  ?Procedure Laterality Date  ? I & D EXTREMITY Left 05/13/2021  ? Procedure: DEBRIDEMENT LEFT KNEE;  Surgeon: Nadara Mustarduda, Marcus V, MD;  [facility];  Service: Orthopedics;  Laterality: Left;  ? KNEE ARTHROSCOPY WITH ANTERIOR CRUCIATE LIGAMENT (ACL) REPAIR WITH HAMSTRING GRAFT Left 03/25/2021  ? Procedure: left knee anterior cruciate ligament, posterior cruciate ligment, posterolateral corner reconstruction arthroscopy; allograft ligaments for reconstruction, meniscal debridement vs repair;  Surgeon: Cammy Copaean, Gregory Scott, MD;  [facility];  Service: Orthopedics;  Laterality: Left;  ? WISDOM TOOTH EXTRACTION    ? ?Patient Active Problem List  ? Diagnosis Date Noted  ? Dehiscence of incision, sequela   ? Open knee wound, left, sequela   ? Knee dislocation 04/05/2021  ? Severe obesity (BMI >= 40) (HCC) 04/05/2021  ? Rupture of posterior cruciate ligament of left knee   ? Left anterior cruciate ligament tear   ? Acute lateral meniscus tear of left knee   ? Injury of posterolateral corner of knee, left, subsequent encounter   ? 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 S/P left knee surgery 03/25/2021  ? ? ?REFERRING DIAG: S/P reconstruction of ligament of knee ? ?THERAPY DIAG:  ?S/P reconstruction of ligament of knee ? ?Decreased strength involving knee joint ? ?Difficulty in walking, not elsewhere classified ? ?PERTINENT HISTORY: unremarkable  ? ?PRECAUTIONS: fall ? ?SUBJECTIVE: Patient reports that she is considering not buying the hinge brace that was recommended by her physician due to the price and it reportedly not being covered by insurance.  ? ?PAIN:  ?Are you having pain? No ? ? ?OBJECTIVE: ? ? Today's Treatment: ? 07/05/21: ?  Aerobic: ?  -Recumbent bike x5.725min ?  Seated: ?-Sit to stands on blue foam 4x6 ?Standing: ?-Reverse lunges in PBs 10x5915ft ?-Stair negotiation 3x4 ?   ?  ?ROM:  LT: 0-120  RT 0-125  ?  ?Strength:  ?Lt hip flexion:           5/5 ?LT hip extension:     3+/5  ?Lt hip abduction:      3+/5 ?Lt hip adduction:      3+/5 ?  ?Lt Knee extension:  4/5 ?Lt knee flexion:       3+/5 ?  ?Single leg stance: ?Rt:  4"  ?LT:  1" ?  ?Five time sit to stand:  22.49 needs cuing to keep wt even  ?  ?Exercises:  Side lying abduction  4# x 10; Hip adduction 2# x 10  ?                   Prone ham curl 2# x 10  ?                   Prone hip extension 2# LT ; 4# rt x 10  ?  ?                   Standing:  wall sit x 15" x 5  ?                                    Heel raises x 15 ?                                    Squat x 15 ?                    ?  PT Education - 06/28/21 1529   ?  ?  Education Details The need to work on hip extension and knee flexion exercises   ?  Person(s) Educated Patient   ?  Methods Explanation   ?  Comprehension Verbalized understanding   ?  ?   ?  ?  ?   ?  ?  ?  PT Short Term Goals - 06/01/21 1043   ?  ?       ?       ?  PT SHORT TERM GOAL #1  ?  Title Patient will achieve and maintain a Lt knee AROM equal to that of the Rt knee.   ?  Time 6   ?  Period Weeks   ?  Status Achieved   ?  Target Date 07/04/21   ?       ?  PT SHORT  TERM GOAL #2  ?  Title Patient will demonstrate a normalized gait during a and without an AD.   ?  Time 6   ?  Period Weeks   ?  Status On-going   ?  Target Date 07/04/21   ?       ?  PT SHORT TERM GOAL #3  ?  Title Patient will have no swelling/pain following exercise.   ?  Time 6   ?  Period Weeks   ?  Status On-going   ?  Target Date 07/04/21   ?  ?   ?  ?  ?   ?  ?  ?  ?  PT Long Term Goals - 06/01/21 1043   ?  ?       ?       ?  PT LONG TERM GOAL #1  ?  Title Patient will report no episodes of instability during normal household tasks.   ?  Time 12   ?  Period Weeks   ?  Status Achieved   ?  Target Date 08/15/21   ?       ?  PT LONG TERM GOAL #2  ?  Title Patient will be able to evenly distribute BW through the LEs during 10 consecutive BW squats without pain and to a depth of at least 60 degrees of knee flexion.   ?  Time 12   ?  Period Weeks   ?  Status On-going   ?  Target Date 08/15/21   ?       ?  PT LONG TERM GOAL #3  ?  Title Lt quad extension strength will be 80% or greater of that of the Rt knee.   ?  Time 12   ?  Period Weeks   ?  Status On-going   ?  Target Date 08/15/21   ?       ?  PT LONG TERM GOAL #4  ?  Title Lt quad flexion strength will be 80% or greater of that of the Rt knee.   ?  Time 12   ?  Period Weeks   ?  Status On-going   ?  Target Date 08/15/21   ?       ?  PT LONG TERM GOAL #5  ?  Title Patient will be able to negotiate a normal flight of stairs(12-16 steps) reciprocally with the use of 1 hand rail and without an onset of Lt knee pain.   ?  Time 12   ?  Period Weeks   ?  Status On-going   ?  Target Date 08/15/21   ?  ?   ?  ?  ?   ?  ?  ?  ?  ?  ?  ?  ?  Plan - 06/30/21   ?  ?  Clinical Impression Statement Patient did struggle with maintaining an even weigh distribution between the LEs during sit to stand transfers. Following verbal cueing she was able to generally demonstrate an even stand although she usually maintained her Rt side weight shift especially for the eccentric  phase. She is still using the "up with the good, down with the bad" strategy for stair negotiation although she did quickly load the LLE during stair descent.   ?  Personal Factors and Comorbidities Fitness   ?  Examination-Activity Limitations Bathing;Bed Mobility;Bend;Caring for Others;Carry;Dressing;Hygiene/Grooming;Lift;Toileting;Stand;Stairs;Squat;Sit;Locomotion Level;Transfers   ?  Examination-Participation Restrictions Cleaning;Community Activity;Driving;Interpersonal Relationship;Shop;Yard Work;Occupation;Laundry   ?  Stability/Clinical Decision Making Stable/Uncomplicated   ?  Rehab Potential Fair   ?  PT Frequency 2x / week   ?  PT Duration 12 weeks   ?  PT Treatment/Interventions ADLs/Self Care Home Management;DME Instruction;Electrical Stimulation;Neuromuscular re-education;Balance training;Therapeutic exercise;Therapeutic activities;Functional mobility training;Stair training;Gait training;Patient/family education;Manual techniques;Passive range of motion   ?  PT Next Visit Plan Perform progress note during the next session. continue to increase WB through Lt to normalize gait and improve strength.   ?  PT Home Exercise Plan Sitting Heel Slide with Towel - 3-4 x daily - 7 x weekly - 6 reps - 15s hold  Long Sitting Ankle Pumps - 5-6 x daily - 7 x weekly - 20 reps  Supine Quad Set - 2-3 x daily - 7 x weekly - 8 reps - 5s hold  Sidelying Hip Abduction - 2-3 x daily - 7 x weekly - 3 sets - 10 reps  Prone Knee Flexion - 2-3 x daily - 7 x weekly - 3 sets - 12 reps  Seated Knee Extension AROM - 2-3 x daily - 7 x weekly - 3 sets - 10 reps;  2/22 : SLR, prone hip extension , heel Raises minisquats.3/9: prone hamstring curls and hip extension, bridge   ?  Consulted and Agree with Plan of Care Patient   ?  ?   ?  ?  ?   ?  ?  ?  Patient will benefit from skilled therapeutic intervention in order to improve the following deficits and impairments:  Decreased range of motion, Difficulty walking, Decreased activity  tolerance, Pain, Hypomobility, Decreased strength ?  ?Visit Diagnosis: ?S/P reconstruction of ligament of knee ?  ?Decreased strength involving knee joint ?  ?Difficulty in walking, not elsewhere classified ?

## 2021-07-06 ENCOUNTER — Encounter: Payer: Self-pay | Admitting: Orthopedic Surgery

## 2021-07-06 NOTE — Progress Notes (Signed)
? ?Post-Op Visit Note ?  ?Patient: Meagan Oneill           ?Date of Birth: 12-Aug-1990           ?MRN: [id number] ?Visit Date: 07/04/2021 ?PCP: Patient, No Pcp Per (Inactive) ? ? ?Assessment & Plan: ? ?Chief Complaint:  ?Chief Complaint  ?Patient presents with  ? Left Knee - Follow-up  ?  left knee debridement on 05/13/2021 with Dr. Lajoyce Corners and 03/25/21 multiligament reconstruction left knee including ACL PCL LCL and posterolateral corner.   ? ?Visit Diagnoses: No diagnosis found. ? ?Plan: Patient is a 31 year old female who presents s/p left knee multiligament reconstruction of ACL, PCL, LCL, posterior lateral corner on 03/25/2021.  She had subsequent left knee incisional debridement on 05/13/2021 by Dr. Lajoyce Corners.  She is doing very well currently.  She is ambulating full weightbearing with a cane and has progressed from the walker to a cane.  She denies any knee instability.  She has no difficulty with getting up from a low chair or a toilet.  She has no difficulty with navigating stairs going up or down.  She was unable to get a hinged knee brace that would fit her and the custom brace was $1500 so she decided not to go for this.  She is in physical therapy, mostly working on strengthening.  She notes occasional pain but really only notices pain if she stands her long periods of time such as an hour.  She cooked dinner the other night which involves standing for an hour and a half and she had some increased pain but nothing that lingered or caused her significant unrelenting discomfort.  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  She wants to go to the gym and lose weight and continue with physical therapy to get her leg is strong as possible.  On exam, incisions are well-healed.  There is no evidence of infection or dehiscence.  She has range of motion from 0 degrees extension to 105 degrees of knee flexion.  Excellent stability to anterior and posterior drawer tests.  She has  good stability to valgus stressing of the left knee at 0 and 30 degrees but she does have some increased rotational instability consistent with prior examinations compared with the contralateral knee.  No calf tenderness.  Negative Homans' sign.  Excellent hamstring and quadricep strength rated 5/5.  She ambulates with antalgia and does seem that she does not really flex her knee as much as normal when she is ambulating.  This is likely due to the extended period of time that she stayed in the knee immobilizer and walked with her leg in full extension.  She is continuing to work on this and physical therapy.  Plan to continue with PT and follow-up in 6 weeks for clinical recheck. ? ?Follow-Up Instructions: No follow-ups on file.  ? ?Orders:  ?No orders of the defined types were placed in this encounter. ? ?No orders of the defined types were placed in this encounter. ? ? ?Imaging: ?No results found. ? ?PMFS History: ?Patient Active Problem List  ? Diagnosis Date Noted  ? Dehiscence of incision, sequela   ? Open knee wound, left, sequela   ? Knee dislocation 04/05/2021  ? Severe obesity (BMI >= 40) (HCC) 04/05/2021  ? Rupture of posterior cruciate ligament of left knee   ? Left anterior cruciate ligament tear   ? Acute lateral meniscus tear of  left knee   ? Injury of posterolateral corner of knee, left, subsequent encounter   ? 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 S/P left knee surgery 03/25/2021  ? ?Past Medical History:  ?Diagnosis Date  ? Anemia   ? Asthma   ? no inhaler  ? Environmental allergies   ? Hypertension   ?  ?Family History  ?Problem Relation Age of Onset  ? Hypertension Mother   ? Diabetes Mother   ? Hypertension Sister   ?  ?Past Surgical History:  ?Procedure Laterality Date  ? I & D EXTREMITY Left 05/13/2021  ? Procedure: DEBRIDEMENT LEFT KNEE;  Surgeon: Nadara Mustard, MD;  [facility];  Service: Orthopedics;  Laterality: Left;  ? KNEE ARTHROSCOPY WITH ANTERIOR CRUCIATE  LIGAMENT (ACL) REPAIR WITH HAMSTRING GRAFT Left 03/25/2021  ? Procedure: left knee anterior cruciate ligament, posterior cruciate ligment, posterolateral corner reconstruction arthroscopy; allograft ligaments for reconstruction, meniscal debridement vs repair;  Surgeon: Cammy Copa, MD;  Location[facility] OR;  Service: Orthopedics;  Laterality: Left;  ? WISDOM TOOTH EXTRACTION    ? ?Social History  ? ?Occupational History  ? Not on file  ?Tobacco Use  ? Smoking status: Never  ? Smokeless tobacco: Never  ?Vaping Use  ? Vaping Use: Never used  ?Substance and Sexual Activity  ? Alcohol use: Not Currently  ?  Alcohol/week: 1.0 standard drink  ?  Types: 1 Glasses of wine per week  ?  Comment: occas.  ? Drug use: No  ? Sexual activity: Yes  ?  Birth control/protection: None  ? ? ? ?

## 2021-07-07 ENCOUNTER — Ambulatory Visit (HOSPITAL_COMMUNITY): Payer: Self-pay | Admitting: Physical Therapy

## 2021-07-12 ENCOUNTER — Ambulatory Visit (HOSPITAL_COMMUNITY): Payer: Medicaid Other | Attending: Family | Admitting: Physical Therapy

## 2021-07-12 DIAGNOSIS — R29898 Other symptoms and signs involving the musculoskeletal system: Secondary | ICD-10-CM | POA: Insufficient documentation

## 2021-07-12 DIAGNOSIS — Z9889 Other specified postprocedural states: Secondary | ICD-10-CM | POA: Insufficient documentation

## 2021-07-12 DIAGNOSIS — R262 Difficulty in walking, not elsewhere classified: Secondary | ICD-10-CM | POA: Insufficient documentation

## 2021-07-12 NOTE — Therapy (Signed)
?OUTPATIENT PHYSICAL THERAPY TREATMENT NOTE ? ? ?Patient Name: Meagan Oneill ?MRN: [id number] ?DOB:03-12-1991, 31 y.o., female ?Today's Date: 07/12/2021 ? ?PCP: Patient, No Pcp Per (Inactive) ?REFERRING PROVIDER: Suzan Slick, NP ? ? PT End of Session - 07/12/21 1631   ? ? Visit Number 12   ? Number of Visits 24   ? Date for PT Re-Evaluation 08/15/21   ? Authorization Type Self Pay   ? Progress Note Due on Visit 20   ? PT Start Time 1628   ? PT Stop Time [id number]   ? PT Time Calculation (min) 36 min   ? Equipment Utilized During Treatment Left knee immobilizer   removed during sessin  ? Activity Tolerance Patient tolerated treatment well   ? Behavior During Therapy [facility] for tasks assessed/performed   ? ?  ?  ? ?  ? ? ?Past Medical History:  ?Diagnosis Date  ? Anemia   ? Asthma   ? no inhaler  ? Environmental allergies   ? Hypertension   ? ?Past Surgical History:  ?Procedure Laterality Date  ? I & D EXTREMITY Left 05/13/2021  ? Procedure: DEBRIDEMENT LEFT KNEE;  Surgeon: Newt Minion, MD;  Location: [place];  Service: Orthopedics;  Laterality: Left;  ? KNEE ARTHROSCOPY WITH ANTERIOR CRUCIATE LIGAMENT (ACL) REPAIR WITH HAMSTRING GRAFT Left 03/25/2021  ? Procedure: left knee anterior cruciate ligament, posterior cruciate ligment, posterolateral corner reconstruction arthroscopy; allograft ligaments for reconstruction, meniscal debridement vs repair;  Surgeon: Meredith Pel, MD;  Location: [place];  Service: Orthopedics;  Laterality: Left;  ? WISDOM TOOTH EXTRACTION    ? ?Patient Active Problem List  ? Diagnosis Date Noted  ? Dehiscence of incision, sequela   ? Open knee wound, left, sequela   ? Knee dislocation 04/05/2021  ? Severe obesity (BMI >= 40) ([place]) 04/05/2021  ? Rupture of posterior cruciate ligament of left knee   ? Left anterior cruciate ligament tear   ? Acute lateral meniscus tear of left knee   ? Injury of posterolateral corner of knee, left, subsequent encounter   ? 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 S/P left knee surgery 03/25/2021  ? ? ?REFERRING DIAG: S/P reconstruction of ligament of knee ? ?THERAPY DIAG:  ?S/P reconstruction of ligament of knee ? ?Decreased strength involving knee joint ? ?Difficulty in walking, not elsewhere classified ? ?PERTINENT HISTORY: unremarkable  ? ?PRECAUTIONS: fall ? ?SUBJECTIVE: Patient arrived late to appt.  States she is having pain in both of her knees equal at 5/10.  Thinks its because she's been doing more activity, I.e. cooking and walking around stores shopping. ? ?PAIN:  ?Are you having pain? Yes, 5/10 in bilateral knees, equal ? ? ?OBJECTIVE: ? ? Today's Treatment: ? ? ? 07/12/21: ? Recumbent bike seat 15 on 4 minutes warm up and ROM ? Attempted wall slides but too painful ? Supine SLR with 5#  2X10 ? Sidelying hip abduction 5#  2X10 ? Prone hip extension 5# 2X10 ? Prone knee flexion 5# 2X10 ? LAQ 5# 2X10 ? ?  ? 07/05/21: ?  Aerobic: ?  -Recumbent bike x5.61min ?  Seated: ?-Sit to stands on blue foam 4x6 ?Standing: ?-Reverse lunges in PBs 10x47ft ?-Stair negotiation 3x4 ?   ? ?06/30/21: ?PROGRESS NOTE COMPLETED (see this date for measures)  ?  ?Exercises:  Side lying abduction  4# x 10; Hip adduction 2# x 10  ?  Prone ham curl 2# x 10  ?                   Prone hip extension 2# LT ; 4# rt x 10  ?  ?                   Standing:  wall sit x 15" x 5  ?                                    Heel raises x 15 ?                                    Squat x 15 ?                    ?  PT Education - 06/28/21 1529   ?  ?  Education Details The need to work on hip extension and knee flexion exercises   ?  Person(s) Educated Patient   ?  Methods Explanation   ?  Comprehension Verbalized understanding   ?  ?   ?  ?  ?   ?  ?  ?  PT Short Term Goals - 06/01/21 1043   ?  ?       ?       ?  PT SHORT TERM GOAL #1  ?  Title Patient will achieve and maintain a Lt knee AROM equal to that of the Rt knee.   ?  Time 6   ?  Period Weeks   ?  Status Achieved   ?   Target Date 07/04/21   ?       ?  PT SHORT TERM GOAL #2  ?  Title Patient will demonstrate a normalized gait during a and without an AD.   ?  Time 6   ?  Period Weeks   ?  Status On-going   ?  Target Date 07/04/21   ?       ?  PT SHORT TERM GOAL #3  ?  Title Patient will have no swelling/pain following exercise.   ?  Time 6   ?  Period Weeks   ?  Status On-going   ?  Target Date 07/04/21   ?  ?   ?  ?  ?   ?  ?  ?  ?  PT Long Term Goals - 06/01/21 1043   ?  ?       ?       ?  PT LONG TERM GOAL #1  ?  Title Patient will report no episodes of instability during normal household tasks.   ?  Time 12   ?  Period Weeks   ?  Status Achieved   ?  Target Date 08/15/21   ?       ?  PT LONG TERM GOAL #2  ?  Title Patient will be able to evenly distribute BW through the LEs during 10 consecutive BW squats without pain and to a depth of at least 60 degrees of knee flexion.   ?  Time 12   ?  Period Weeks   ?  Status On-going   ?  Target Date 08/15/21   ?       ?  PT LONG TERM GOAL #3  ?  Title Lt quad extension strength will be 80% or greater of that of the Rt knee.   ?  Time 12   ?  Period Weeks   ?  Status On-going   ?  Target Date 08/15/21   ?       ?  PT LONG TERM GOAL #4  ?  Title Lt quad flexion strength will be 80% or greater of that of the Rt knee.   ?  Time 12   ?  Period Weeks   ?  Status On-going   ?  Target Date 08/15/21   ?       ?  PT LONG TERM GOAL #5  ?  Title Patient will be able to negotiate a normal flight of stairs(12-16 steps) reciprocally with the use of 1 hand rail and without an onset of Lt knee pain.   ?  Time 12   ?  Period Weeks   ?  Status On-going   ?  Target Date 08/15/21   ?  ?   ?  ?  ?   ?  ?  ?  ?  ?  ?  ?  ?  Plan - 06/30/21   ?  ?  Clinical Impression Statement Patient with increased discomfort in bilateral knees this session.  Began with bike to help loosen and warm up the joint space.  Attempted wall slides but with increased pain with slightest bend while weight bearing.  Requested to  complete NWB exercises this session.  Completed 2 sets of each exercises NWB and increased to 5# bilaterally.     ?  Personal Factors and Comorbidities Fitness   ?  Examination-Activity Limitations Bathing;Bed Mobility;Bend;Caring for Others;Carry;Dressing;Hygiene/Grooming;Lift;Toileting;Stand;Stairs;Squat;Sit;Locomotion Level;Transfers   ?  Examination-Participation Restrictions Cleaning;Community Activity;Driving;Interpersonal Relationship;Shop;Yard Work;Occupation;Laundry   ?  Stability/Clinical Decision Making Stable/Uncomplicated   ?  Rehab Potential Fair   ?  PT Frequency 2x / week   ?  PT Duration 12 weeks   ?  PT Treatment/Interventions ADLs/Self Care Home Management;DME Instruction;Electrical Stimulation;Neuromuscular re-education;Balance training;Therapeutic exercise;Therapeutic activities;Functional mobility training;Stair training;Gait training;Patient/family education;Manual techniques;Passive range of motion   ?  PT Next Visit Plan Continue to increase WB through Lt to normalize gait and improve strength. Resume standing exercises if pain in knees reduced next session.  ?  PT Home Exercise Plan Sitting Heel Slide with Towel - 3-4 x daily - 7 x weekly - 6 reps - 15s hold  Long Sitting Ankle Pumps - 5-6 x daily - 7 x weekly - 20 reps  Supine Quad Set - 2-3 x daily - 7 x weekly - 8 reps - 5s hold  Sidelying Hip Abduction - 2-3 x daily - 7 x weekly - 3 sets - 10 reps  Prone Knee Flexion - 2-3 x daily - 7 x weekly - 3 sets - 12 reps  Seated Knee Extension AROM - 2-3 x daily - 7 x weekly - 3 sets - 10 reps;  2/22 : SLR, prone hip extension , heel Raises minisquats.3/9: prone hamstring curls and hip extension, bridge   ?  Consulted and Agree with Plan of Care Patient   ?  ?   ?  ?  ?   ?  ?  ?Patient will benefit from skilled therapeutic intervention in order to improve the following deficits and impairments:  Decreased range of motion, Difficulty walking, Decreased activity tolerance, Pain, Hypomobility,  Decreased strength ?  ?Visit Diagnosis: ?S/P  reconstruction of ligament of knee ?  ?Decreased strength involving knee joint ?  ?Difficulty in walking, not elsewhere classified ?  ?  ?  ?  ?Problem List ?

## 2021-07-14 ENCOUNTER — Ambulatory Visit (HOSPITAL_COMMUNITY): Payer: Medicaid Other | Admitting: Physical Therapy

## 2021-07-14 ENCOUNTER — Encounter (HOSPITAL_COMMUNITY): Payer: Self-pay | Admitting: Physical Therapy

## 2021-07-14 DIAGNOSIS — R29898 Other symptoms and signs involving the musculoskeletal system: Secondary | ICD-10-CM

## 2021-07-14 DIAGNOSIS — R262 Difficulty in walking, not elsewhere classified: Secondary | ICD-10-CM

## 2021-07-14 DIAGNOSIS — Z9889 Other specified postprocedural states: Secondary | ICD-10-CM | POA: Diagnosis not present

## 2021-07-14 NOTE — Therapy (Signed)
?OUTPATIENT PHYSICAL THERAPY TREATMENT NOTE ? ? ?Patient Name: Meagan Oneill ?MRN: [id number] ?DOB:February 21, 1991, 31 y.o., female ?Today's Date: 07/14/2021 ? ?PCP: Patient, No Pcp Per (Inactive) ?REFERRING PROVIDER: Adonis Huguenin, NP ? ?END OF SESSION:  ? PT End of Session - 07/14/21 1455   ? ? Visit Number 13   ? Number of Visits 24   ? Date for PT Re-Evaluation 08/15/21   ? Authorization Type Self Pay   ? Progress Note Due on Visit 20   ? PT Start Time 1445   ? PT Stop Time 1516   ? PT Time Calculation (min) 31 min   ? Equipment Utilized During Treatment Left knee immobilizer   removed during sessin  ? Activity Tolerance Patient tolerated treatment well   ? Behavior During Therapy [facility] for tasks assessed/performed   ? ?  ?  ? ?  ? ? ?Past Medical History:  ?Diagnosis Date  ? Anemia   ? Asthma   ? no inhaler  ? Environmental allergies   ? Hypertension   ? ?Past Surgical History:  ?Procedure Laterality Date  ? I & D EXTREMITY Left 05/13/2021  ? Procedure: DEBRIDEMENT LEFT KNEE;  Surgeon: Nadara Mustard, MD;  Location: [facility] OR;  Service: Orthopedics;  Laterality: Left;  ? KNEE ARTHROSCOPY WITH ANTERIOR CRUCIATE LIGAMENT (ACL) REPAIR WITH HAMSTRING GRAFT Left 03/25/2021  ? Procedure: left knee anterior cruciate ligament, posterior cruciate ligment, posterolateral corner reconstruction arthroscopy; allograft ligaments for reconstruction, meniscal debridement vs repair;  Surgeon: Cammy Copa, MD;  Location: [facility] OR;  Service: Orthopedics;  Laterality: Left;  ? WISDOM TOOTH EXTRACTION    ? ?Patient Active Problem List  ? Diagnosis Date Noted  ? Dehiscence of incision, sequela   ? Open knee wound, left, sequela   ? Knee dislocation 04/05/2021  ? Severe obesity (BMI >= 40) ([facility]) 04/05/2021  ? Rupture of posterior cruciate ligament of left knee   ? Left anterior cruciate ligament tear   ? Acute lateral meniscus tear of left knee   ? Injury of posterolateral corner of knee, left, subsequent encounter   ? 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 S/P left knee surgery 03/25/2021  ? ? ?REFERRING DIAG: S/P reconstruction of ligament of knee ? ?THERAPY DIAG:  ?S/P reconstruction of ligament of knee ? ?Decreased strength involving knee joint ? ?Difficulty in walking, not elsewhere classified ? ?PERTINENT HISTORY: unremarkable  ? ?PRECAUTIONS: fall ? ?SUBJECTIVE: Patient reports that she doesn't think she needs to use the cane anymore, but she also doesn't know how to stop the limp that she has through the LLE. ? ?PAIN:  ?Are you having pain? No ? ? ?OBJECTIVE: ?  ?            Today's Treatment: ?  07/12/21: ? Seated: ? -STS on blue foam 4x10 ? -Cable resisted hamstring curls #1 2x14 ? Neuro re-ed: ? -SLS stance on blue foam w/ 3 direction toe tapping 5x6 ?  ?            07/12/21: ?            Recumbent bike seat 15 on 4 minutes warm up and ROM ?            Attempted wall slides but too painful ?            Supine SLR with 5#  2X10 ?            Sidelying  hip abduction 5#  2X10 ?            Prone hip extension 5# 2X10 ?            Prone knee flexion 5# 2X10 ?            LAQ 5# 2X10 ?  ?             ?            07/05/21: ?                        Aerobic: ?                        -Recumbent bike x5.84min ?                        Seated: ?-Sit to stands on blue foam 4x6 ?Standing: ?-Reverse lunges in PBs 10x79ft ?-Stair negotiation 3x4 ?                         ?  ?06/30/21: ?PROGRESS NOTE COMPLETED (see this date for measures)  ?  ?Exercises:  Side lying abduction  4# x 10; Hip adduction 2# x 10  ?                   Prone ham curl 2# x 10  ?                   Prone hip extension 2# LT ; 4# rt x 10  ?  ?                   Standing:  wall sit x 15" x 5  ?                                    Heel raises x 15 ?                                    Squat x 15 ?                    ?  PT Education - 06/28/21 1529   ?  ?  Education Details The need to work on hip extension and knee flexion exercises   ?  Person(s) Educated Patient   ?   Methods Explanation   ?  Comprehension Verbalized understanding   ?  ?   ?  ?  ?   ?  ?  ?  PT Short Term Goals - 06/01/21 1043   ?  ?       ?       ?  PT SHORT TERM GOAL #1  ?  Title Patient will achieve and maintain a Lt knee AROM equal to that of the Rt knee.   ?  Time 6   ?  Period Weeks   ?  Status Achieved   ?  Target Date 07/04/21   ?       ?  PT SHORT TERM GOAL #2  ?  Title Patient will demonstrate a normalized gait during a and without an AD.   ?  Time 6   ?  Period Weeks   ?  Status On-going   ?  Target Date 07/04/21   ?       ?  PT SHORT TERM GOAL #3  ?  Title Patient will have no swelling/pain following exercise.   ?  Time 6   ?  Period Weeks   ?  Status On-going   ?  Target Date 07/04/21   ?  ?   ?  ?  ?   ?  ?  ?  ?  PT Long Term Goals - 06/01/21 1043   ?  ?       ?       ?  PT LONG TERM GOAL #1  ?  Title Patient will report no episodes of instability during normal household tasks.   ?  Time 12   ?  Period Weeks   ?  Status Achieved   ?  Target Date 08/15/21   ?       ?  PT LONG TERM GOAL #2  ?  Title Patient will be able to evenly distribute BW through the LEs during 10 consecutive BW squats without pain and to a depth of at least 60 degrees of knee flexion.   ?  Time 12   ?  Period Weeks   ?  Status On-going   ?  Target Date 08/15/21   ?       ?  PT LONG TERM GOAL #3  ?  Title Lt quad extension strength will be 80% or greater of that of the Rt knee.   ?  Time 12   ?  Period Weeks   ?  Status On-going   ?  Target Date 08/15/21   ?       ?  PT LONG TERM GOAL #4  ?  Title Lt quad flexion strength will be 80% or greater of that of the Rt knee.   ?  Time 12   ?  Period Weeks   ?  Status On-going   ?  Target Date 08/15/21   ?       ?  PT LONG TERM GOAL #5  ?  Title Patient will be able to negotiate a normal flight of stairs(12-16 steps) reciprocally with the use of 1 hand rail and without an onset of Lt knee pain.   ?  Time 12   ?  Period Weeks   ?  Status On-going   ?  Target Date 08/15/21   ?  ?   ?   ?  ?   ?  ?  ?  ?  ?  ?  ?  ?  Plan - 06/30/21   ?  ?  Clinical Impression Statement Patient tolerated treatment well during today's session, although she did struggle with confidence during the SLS activity on foam. There was reportedly no pain abnormal sensation during this task, but she did not feel as though she was able to place the same amount of weight through that limb compared ot when she trialed the same activity on the RLE. After verbal and visual cueing her weight bearing did improve slightly, but she still require use of the UEs for stabilization. Of note, she did arrive 13 minutes late to today's appointment.  ?  Personal Factors and Comorbidities Fitness   ?  Examination-Activity Limitations Bathing;Bed Mobility;Bend;Caring for Others;Carry;Dressing;Hygiene/Grooming;Lift;Toileting;Stand;Stairs;Squat;Sit;Locomotion Level;Transfers   ?  Examination-Participation Restrictions Cleaning;Community Activity;Driving;Interpersonal Relationship;Shop;Yard Work;Occupation;Laundry   ?  Stability/Clinical Decision Making  Stable/Uncomplicated   ?  Rehab Potential Fair   ?  PT Frequency 2x / week   ?  PT Duration 12 weeks   ?  PT Treatment/Interventions ADLs/Self Care Home Management;DME Instruction;Electrical Stimulation;Neuromuscular re-education;Balance training;Therapeutic exercise;Therapeutic activities;Functional mobility training;Stair training;Gait training;Patient/family education;Manual techniques;Passive range of motion   ?  PT Next Visit Plan Continue to increase WB through Lt to normalize gait and improve strength. Resume standing exercises if pain in knees reduced next session.  ?  PT Home Exercise Plan Sitting Heel Slide with Towel - 3-4 x daily - 7 x weekly - 6 reps - 15s hold  Long Sitting Ankle Pumps - 5-6 x daily - 7 x weekly - 20 reps  Supine Quad Set - 2-3 x daily - 7 x weekly - 8 reps - 5s hold  Sidelying Hip Abduction - 2-3 x daily - 7 x weekly - 3 sets - 10 reps  Prone Knee Flexion - 2-3 x  daily - 7 x weekly - 3 sets - 12 reps  Seated Knee Extension AROM - 2-3 x daily - 7 x weekly - 3 sets - 10 reps;  2/22 : SLR, prone hip extension , heel Raises minisquats.3/9: prone hamstring curls and hip exten

## 2021-07-19 ENCOUNTER — Ambulatory Visit (HOSPITAL_COMMUNITY): Payer: Medicaid Other | Admitting: Physical Therapy

## 2021-07-19 DIAGNOSIS — Z9889 Other specified postprocedural states: Secondary | ICD-10-CM | POA: Diagnosis not present

## 2021-07-19 DIAGNOSIS — R262 Difficulty in walking, not elsewhere classified: Secondary | ICD-10-CM

## 2021-07-19 DIAGNOSIS — R29898 Other symptoms and signs involving the musculoskeletal system: Secondary | ICD-10-CM

## 2021-07-19 NOTE — Therapy (Signed)
?OUTPATIENT PHYSICAL THERAPY TREATMENT NOTE ? ? ?Patient Name: Meagan Oneill ?MRN: [id number] ?DOB:October 18, 1990, 31 y.o., female ?Today's Date: 07/19/2021 ? ?PCP: Patient, No Pcp Per (Inactive) ?REFERRING PROVIDER: Suzan Slick, NP ? ?END OF SESSION:  ? PT End of Session - 07/19/21 1633   ? ? Visit Number 14   ? Number of Visits 24   ? Date for PT Re-Evaluation 08/15/21   ? Authorization Type Self Pay   ? Progress Note Due on Visit 20   ? PT Start Time [id number]   ? PT Stop Time 1700   ? PT Time Calculation (min) 26 min   ? Equipment Utilized During Treatment Left knee immobilizer   removed during sessin  ? Activity Tolerance Patient tolerated treatment well   ? Behavior During Therapy [facility] for tasks assessed/performed   ? ?  ?  ? ?  ? ? ? ?Past Medical History:  ?Diagnosis Date  ? Anemia   ? Asthma   ? no inhaler  ? Environmental allergies   ? Hypertension   ? ?Past Surgical History:  ?Procedure Laterality Date  ? I & D EXTREMITY Left 05/13/2021  ? Procedure: DEBRIDEMENT LEFT KNEE;  Surgeon: Newt Minion, MD;  Location: [place];  Service: Orthopedics;  Laterality: Left;  ? KNEE ARTHROSCOPY WITH ANTERIOR CRUCIATE LIGAMENT (ACL) REPAIR WITH HAMSTRING GRAFT Left 03/25/2021  ? Procedure: left knee anterior cruciate ligament, posterior cruciate ligment, posterolateral corner reconstruction arthroscopy; allograft ligaments for reconstruction, meniscal debridement vs repair;  Surgeon: Meredith Pel, MD;  Location: [place];  Service: Orthopedics;  Laterality: Left;  ? WISDOM TOOTH EXTRACTION    ? ?Patient Active Problem List  ? Diagnosis Date Noted  ? Dehiscence of incision, sequela   ? Open knee wound, left, sequela   ? Knee dislocation 04/05/2021  ? Severe obesity (BMI >= 40) ([place]) 04/05/2021  ? Rupture of posterior cruciate ligament of left knee   ? Left anterior cruciate ligament tear   ? Acute lateral meniscus tear of left knee   ? Injury of posterolateral corner of knee, left, subsequent encounter   ? 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 S/P left knee surgery 03/25/2021  ? ? ?REFERRING DIAG: S/P reconstruction of ligament of knee ? ?THERAPY DIAG:  ?S/P reconstruction of ligament of knee ? ?Difficulty in walking, not elsewhere classified ? ?Decreased strength involving knee joint ? ?PERTINENT HISTORY: unremarkable  ? ?PRECAUTIONS: fall ? ?SUBJECTIVE: Patient reports that she is still using her cane mostly but goes some without it at home.  Currently without pain. ? ?PAIN:  ?Are you having pain? No ? ? ?OBJECTIVE: ?  ?         Today's Treatment: ? 07/19/21: ? Seated: ? -STS on blue foam 4x10 ? Standing: ? -SLS stance Lt LE on blue foam w/ 3 direction toe tapping 10X ?   ?07/12/21: ? Seated: ? -STS on blue foam 4x10 ? -Cable resisted hamstring curls #1 2x14 ? Neuro re-ed: ? -SLS stance on blue foam w/ 3 direction toe tapping 5x6 ?  ?            07/12/21: ?            Recumbent bike seat 15 on 4 minutes warm up and ROM ?            Attempted wall slides but too painful ?            Supine SLR  with 5#  2X10 ?            Sidelying hip abduction 5#  2X10 ?            Prone hip extension 5# 2X10 ?            Prone knee flexion 5# 2X10 ?            LAQ 5# 2X10 ?  ?             ?            07/05/21: ?                        Aerobic: ?                        -Recumbent bike x5.65min ?                        Seated: ?-Sit to stands on blue foam 4x6 ?Standing: ?-Reverse lunges in PBs 10x14ft ?-Stair negotiation 3x4 ?                         ?  ?06/30/21: ?PROGRESS NOTE COMPLETED (see this date for measures)  ?  ?Exercises:  Side lying abduction  4# x 10; Hip adduction 2# x 10  ?                   Prone ham curl 2# x 10  ?                   Prone hip extension 2# LT ; 4# rt x 10  ?  ?                   Standing:  wall sit x 15" x 5  ?                                    Heel raises x 15 ?                                    Squat x 15 ?                    ?  PT Education - 06/28/21 1529   ?  ?  Education Details The need to work  on hip extension and knee flexion exercises   ?  Person(s) Educated Patient   ?  Methods Explanation   ?  Comprehension Verbalized understanding   ?  ?   ?  ?  ?   ?  ?  ?  PT Short Term Goals - 06/01/21 1043   ?  ?       ?       ?  PT SHORT TERM GOAL #1  ?  Title Patient will achieve and maintain a Lt knee AROM equal to that of the Rt knee.   ?  Time 6   ?  Period Weeks   ?  Status Achieved   ?  Target Date 07/04/21   ?       ?  PT SHORT TERM GOAL #2  ?  Title Patient will demonstrate a normalized  gait during a 2MWT and without an AD.   ?  Time 6   ?  Period Weeks   ?  Status On-going   ?  Target Date 07/04/21   ?       ?  PT SHORT TERM GOAL #3  ?  Title Patient will have no swelling/pain following exercise.   ?  Time 6   ?  Period Weeks   ?  Status On-going   ?  Target Date 07/04/21   ?  ?   ?  ?  ?   ?  ?  ?  ?  PT Long Term Goals - 06/01/21 1043   ?  ?       ?       ?  PT LONG TERM GOAL #1  ?  Title Patient will report no episodes of instability during normal household tasks.   ?  Time 12   ?  Period Weeks   ?  Status Achieved   ?  Target Date 08/15/21   ?       ?  PT LONG TERM GOAL #2  ?  Title Patient will be able to evenly distribute BW through the LEs during 10 consecutive BW squats without pain and to a depth of at least 60 degrees of knee flexion.   ?  Time 12   ?  Period Weeks   ?  Status On-going   ?  Target Date 08/15/21   ?       ?  PT LONG TERM GOAL #3  ?  Title Lt quad extension strength will be 80% or greater of that of the Rt knee.   ?  Time 12   ?  Period Weeks   ?  Status On-going   ?  Target Date 08/15/21   ?       ?  PT LONG TERM GOAL #4  ?  Title Lt quad flexion strength will be 80% or greater of that of the Rt knee.   ?  Time 12   ?  Period Weeks   ?  Status On-going   ?  Target Date 08/15/21   ?       ?  PT LONG TERM GOAL #5  ?  Title Patient will be able to negotiate a normal flight of stairs(12-16 steps) reciprocally with the use of 1 hand rail and without an onset of Lt knee pain.   ?   Time 12   ?  Period Weeks   ?  Status On-going   ?  Target Date 08/15/21   ?  ?   ?  ?  ?   ?  ?  ?  ?  ?  ?  ?  ?  Plan - 06/30/21   ?  ?  Clinical Impression Statement Began session with sit to stand activity without use of UE's  and on a foam surface.  There was reportedly no pain and with improved ability to distribute weight equally with standing. SLS activity on foam completed today with improved confidence.  Unable to complete all activities this session as pt was 17 minutes late.  Requested to try to make appt on time and reminded of 15 minute cancel/NS policy.  ?  Personal Factors and Comorbidities Fitness   ?  Examination-Activity Limitations Bathing;Bed Mobility;Bend;Caring for Others;Carry;Dressing;Hygiene/Grooming;Lift;Toileting;Stand;Stairs;Squat;Sit;Locomotion Level;Transfers   ?  Examination-Participation Restrictions Cleaning;Community Activity;Driving;Interpersonal Relationship;Shop;Yard Work;Occupation;Laundry   ?  Stability/Clinical Decision Making Stable/Uncomplicated   ?  Rehab Potential Fair   ?  PT Frequency 2x / week   ?  PT Duration 12 weeks   ?  PT Treatment/Interventions ADLs/Self Care Home Management;DME Instruction;Electrical Stimulation;Neuromuscular re-education;Balance training;Therapeutic exercise;Therapeutic activities;Functional mobility training;Stair training;Gait training;Patient/family education;Manual techniques;Passive range of motion   ?  PT Next Visit Plan Continue to increase WB through Lt to normalize gait and improve strength.   ?  PT Home Exercise Plan Sitting Heel Slide with Towel - 3-4 x daily - 7 x weekly - 6 reps - 15s hold  Long Sitting Ankle Pumps - 5-6 x daily - 7 x weekly - 20 reps  Supine Quad Set - 2-3 x daily - 7 x weekly - 8 reps - 5s hold  Sidelying Hip Abduction - 2-3 x daily - 7 x weekly - 3 sets - 10 reps  Prone Knee Flexion - 2-3 x daily - 7 x weekly - 3 sets - 12 reps  Seated Knee Extension AROM - 2-3 x daily - 7 x weekly - 3 sets - 10 reps;  2/22 :  SLR, prone hip extension , heel Raises minisquats.3/9: prone hamstring curls and hip extension, bridge   ?  Consulted and Agree with Plan of Care Patient   ?  ?   ?  ?  ?   ?  ?  ?Patient will benefit from

## 2021-07-21 ENCOUNTER — Ambulatory Visit (HOSPITAL_COMMUNITY): Payer: Medicaid Other | Admitting: Physical Therapy

## 2021-07-21 DIAGNOSIS — Z9889 Other specified postprocedural states: Secondary | ICD-10-CM

## 2021-07-21 DIAGNOSIS — R262 Difficulty in walking, not elsewhere classified: Secondary | ICD-10-CM

## 2021-07-21 DIAGNOSIS — R29898 Other symptoms and signs involving the musculoskeletal system: Secondary | ICD-10-CM

## 2021-07-21 NOTE — Therapy (Signed)
?OUTPATIENT PHYSICAL THERAPY TREATMENT NOTE ? ? ?Patient Name: Meagan Oneill ?MRN: [id number] ?DOB:08-Aug-1990, 31 y.o., female ?Today's Date: 07/21/2021 ? ?PCP: Patient, No Pcp Per (Inactive) ?REFERRING PROVIDER: Suzan Slick, NP ? ?END OF SESSION:  ? PT End of Session - 07/21/21 1639   ? ? Number of Visits 24   ? Date for PT Re-Evaluation 08/15/21   ? Authorization Type Self Pay   ? Progress Note Due on Visit 20   ? PT Start Time 1628   ? PT Stop Time 1700   ? PT Time Calculation (min) 32 min   ? Equipment Utilized During Treatment Left knee immobilizer   removed during sessin  ? Activity Tolerance Patient tolerated treatment well   ? Behavior During Therapy [facility] for tasks assessed/performed   ? ?  ?  ? ?  ? ? ? ? ?Past Medical History:  ?Diagnosis Date  ? Anemia   ? Asthma   ? no inhaler  ? Environmental allergies   ? Hypertension   ? ?Past Surgical History:  ?Procedure Laterality Date  ? I & D EXTREMITY Left 05/13/2021  ? Procedure: DEBRIDEMENT LEFT KNEE;  Surgeon: Newt Minion, MD;  Location: [place];  Service: Orthopedics;  Laterality: Left;  ? KNEE ARTHROSCOPY WITH ANTERIOR CRUCIATE LIGAMENT (ACL) REPAIR WITH HAMSTRING GRAFT Left 03/25/2021  ? Procedure: left knee anterior cruciate ligament, posterior cruciate ligment, posterolateral corner reconstruction arthroscopy; allograft ligaments for reconstruction, meniscal debridement vs repair;  Surgeon: Meredith Pel, MD;  Location: [place];  Service: Orthopedics;  Laterality: Left;  ? WISDOM TOOTH EXTRACTION    ? ?Patient Active Problem List  ? Diagnosis Date Noted  ? Dehiscence of incision, sequela   ? Open knee wound, left, sequela   ? Knee dislocation 04/05/2021  ? Severe obesity (BMI >= 40) ([place]) 04/05/2021  ? Rupture of posterior cruciate ligament of left knee   ? Left anterior cruciate ligament tear   ? Acute lateral meniscus tear of left knee   ? Injury of posterolateral corner of knee, left, subsequent encounter   ? 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 S/P left knee surgery 03/25/2021  ? ? ?REFERRING DIAG: S/P reconstruction of ligament of knee ? ?THERAPY DIAG:  ?S/P reconstruction of ligament of knee ? ?Difficulty in walking, not elsewhere classified ? ?Decreased strength involving knee joint ? ?PERTINENT HISTORY: unremarkable  ? ?PRECAUTIONS: fall ? ?SUBJECTIVE: Pt states she was in a hurry here and got pulled over ; states he let her go but asked her to slow down.  Pt reports currently without pain and no soreness from last session.  States she and her boyfriend plan on going to the [employer] to get a membership. ? ?PAIN:  ?Are you having pain? No ? ? ?OBJECTIVE: ?  ?         Today's Treatment: ? 07/21/21: ? Bike full rev 4 minutes seat 14  ?Seated: ? -STS on blue foam 4x10 from standard chair and no UE ? -Stand with both LE, Rt on 6" step lowering to chair with Lt LE only 10X ? Standing: ? 2" step Lt lateral with 1 UE eccentric control ? 2" forward Rt step down with 1 UE eccentric control ? 4" forward step up Lt with 1 UE assist ? ?07/19/21: ? Seated: ? -STS on blue foam 4x10 ? Standing: ? -SLS stance Lt LE on blue foam w/ 3 direction toe tapping 10X ?   ?07/12/21: ?  Seated: ? -STS on blue foam 4x10 ? -Cable resisted hamstring curls #1 2x14 ? Neuro re-ed: ? -SLS stance on blue foam w/ 3 direction toe tapping 5x6 ?  ?            07/12/21: ?            Recumbent bike seat 15 on 4 minutes warm up and ROM ?            Attempted wall slides but too painful ?            Supine SLR with 5#  2X10 ?            Sidelying hip abduction 5#  2X10 ?            Prone hip extension 5# 2X10 ?            Prone knee flexion 5# 2X10 ?            LAQ 5# 2X10 ?  ?             ?            07/05/21: ?                        Aerobic: ?                        -Recumbent bike x5.5min ?                        Seated: ?-Sit to stands on blue foam 4x6 ?Standing: ?-Reverse lunges in PBs 10x19ft ?-Stair negotiation 3x4 ?                         ?  ?06/30/21: ?PROGRESS NOTE  COMPLETED (see this date for measures)  ?  ?Exercises:  Side lying abduction  4# x 10; Hip adduction 2# x 10  ?                   Prone ham curl 2# x 10  ?                   Prone hip extension 2# LT ; 4# rt x 10  ?  ?                   Standing:  wall sit x 15" x 5  ?                                    Heel raises x 15 ?                                    Squat x 15 ?                    ?  PT Education - 06/28/21 1529   ?  ?  Education Details The need to work on quad strengthening  ?  Person(s) Educated Patient   ?  Methods Explanation   ?  Comprehension Verbalized understanding   ?  ?   ?  ?  ?   ?  ?  ?  PT Short Term Goals - 06/01/21 1043   ?  ?       ?       ?  PT SHORT TERM GOAL #1  ?  Title Patient will achieve and maintain a Lt knee AROM equal to that of the Rt knee.   ?  Time 6   ?  Period Weeks   ?  Status Achieved   ?  Target Date 07/04/21   ?       ?  PT SHORT TERM GOAL #2  ?  Title Patient will demonstrate a normalized gait during a 2MWT and without an AD.   ?  Time 6   ?  Period Weeks   ?  Status On-going   ?  Target Date 07/04/21   ?       ?  PT SHORT TERM GOAL #3  ?  Title Patient will have no swelling/pain following exercise.   ?  Time 6   ?  Period Weeks   ?  Status On-going   ?  Target Date 07/04/21   ?  ?   ?  ?  ?   ?  ?  ?  ?  PT Long Term Goals - 06/01/21 1043   ?  ?       ?       ?  PT LONG TERM GOAL #1  ?  Title Patient will report no episodes of instability during normal household tasks.   ?  Time 12   ?  Period Weeks   ?  Status Achieved   ?  Target Date 08/15/21   ?       ?  PT LONG TERM GOAL #2  ?  Title Patient will be able to evenly distribute BW through the LEs during 10 consecutive BW squats without pain and to a depth of at least 60 degrees of knee flexion.   ?  Time 12   ?  Period Weeks   ?  Status On-going   ?  Target Date 08/15/21   ?       ?  PT LONG TERM GOAL #3  ?  Title Lt quad extension strength will be 80% or greater of that of the Rt knee.   ?  Time 12   ?  Period Weeks    ?  Status On-going   ?  Target Date 08/15/21   ?       ?  PT LONG TERM GOAL #4  ?  Title Lt quad flexion strength will be 80% or greater of that of the Rt knee.   ?  Time 12   ?  Period Weeks   ?  Status On-going   ?  Target Date 08/15/21   ?       ?  PT LONG TERM GOAL #5  ?  Title Patient will be able to negotiate a normal flight of stairs(12-16 steps) reciprocally with the use of 1 hand rail and without an onset of Lt knee pain.   ?  Time 12   ?  Period Weeks   ?  Status On-going   ?  Target Date 08/15/21   ?  ?   ?  ?  ?   ?  ?  ?  ?  ?  ?  ?  ?  Plan - 06/30/21   ?  ?  Clinical Impression Statement Began with bike for warm up and continued with LE strengthening/stability exercises.  Progressed squatting activity with Rt LE on step working on eccentric lowering with Lt only.  Pt unable to stand using just  one LE so completed this part of the the activity with both LE.  Began step ups/downs this session.  Pt unable to control motion using just 1 UE unless using 2" step for lateral and forward step downs.  Larger steps she needs both LE and states little LE activation with activity.  Pt with noted quad weakness, encourged to work harder on this at home.  Pt without any pain completing exercises today only weakness.  Unable to complete all activities this session as pt was again late for appt. PT will continue to benefit from skilled physical therapy.  ?  Personal Factors and Comorbidities Fitness   ?  Examination-Activity Limitations Bathing;Bed Mobility;Bend;Caring for Others;Carry;Dressing;Hygiene/Grooming;Lift;Toileting;Stand;Stairs;Squat;Sit;Locomotion Level;Transfers   ?  Examination-Participation Restrictions Cleaning;Community Activity;Driving;Interpersonal Relationship;Shop;Yard Work;Occupation;Laundry   ?  Stability/Clinical Decision Making Stable/Uncomplicated   ?  Rehab Potential Fair   ?  PT Frequency 2x / week   ?  PT Duration 12 weeks   ?  PT Treatment/Interventions ADLs/Self Care Home Management;DME  Instruction;Electrical Stimulation;Neuromuscular re-education;Balance training;Therapeutic exercise;Therapeutic activities;Functional mobility training;Stair training;Gait training;Patient/family education;M

## 2021-07-26 ENCOUNTER — Ambulatory Visit (HOSPITAL_COMMUNITY): Payer: Medicaid Other | Admitting: Physical Therapy

## 2021-07-26 ENCOUNTER — Encounter (HOSPITAL_COMMUNITY): Payer: Self-pay | Admitting: Physical Therapy

## 2021-07-26 DIAGNOSIS — R262 Difficulty in walking, not elsewhere classified: Secondary | ICD-10-CM

## 2021-07-26 DIAGNOSIS — Z9889 Other specified postprocedural states: Secondary | ICD-10-CM

## 2021-07-26 DIAGNOSIS — R29898 Other symptoms and signs involving the musculoskeletal system: Secondary | ICD-10-CM

## 2021-07-26 NOTE — Therapy (Unsigned)
?OUTPATIENT PHYSICAL THERAPY TREATMENT NOTE ? ? ?Patient Name: Meagan Oneill ?MRN: [id number] ?DOB:May 26, 1990, 31 y.o., female ?Today's Date: 07/26/2021 ? ?PCP: Patient, No Pcp Per (Inactive) ?REFERRING PROVIDER: Adonis Huguenin, NP ? ?END OF SESSION:  ? PT End of Session - 07/26/21 1531   ? ? Visit Number 16   ? Number of Visits 24   ? Date for PT Re-Evaluation 08/15/21   ? Authorization Type Self Pay   ? Progress Note Due on Visit 20   ? PT Start Time 1532   ? PT Stop Time 1612   ? PT Time Calculation (min) 40 min   ? Equipment Utilized During Treatment Left knee immobilizer   removed during sessin  ? Activity Tolerance Patient tolerated treatment well   ? Behavior During Therapy [facility] for tasks assessed/performed   ? ?  ?  ? ?  ? ? ? ? ?Past Medical History:  ?Diagnosis Date  ? Anemia   ? Asthma   ? no inhaler  ? Environmental allergies   ? Hypertension   ? ?Past Surgical History:  ?Procedure Laterality Date  ? I & D EXTREMITY Left 05/13/2021  ? Procedure: DEBRIDEMENT LEFT KNEE;  Surgeon: Nadara Mustard, MD;  Location: [facility] OR;  Service: Orthopedics;  Laterality: Left;  ? KNEE ARTHROSCOPY WITH ANTERIOR CRUCIATE LIGAMENT (ACL) REPAIR WITH HAMSTRING GRAFT Left 03/25/2021  ? Procedure: left knee anterior cruciate ligament, posterior cruciate ligment, posterolateral corner reconstruction arthroscopy; allograft ligaments for reconstruction, meniscal debridement vs repair;  Surgeon: Cammy Copa, MD;  Location: [facility] OR;  Service: Orthopedics;  Laterality: Left;  ? WISDOM TOOTH EXTRACTION    ? ?Patient Active Problem List  ? Diagnosis Date Noted  ? Dehiscence of incision, sequela   ? Open knee wound, left, sequela   ? Knee dislocation 04/05/2021  ? Severe obesity (BMI >= 40) (HCC) 04/05/2021  ? Rupture of posterior cruciate ligament of left knee   ? Left anterior cruciate ligament tear   ? Acute lateral meniscus tear of left knee   ? Injury of posterolateral corner of knee, left, subsequent encounter   ? 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 S/P left knee surgery 03/25/2021  ? ? ?REFERRING DIAG: S/P reconstruction of ligament of knee ? ?THERAPY DIAG:  ?S/P reconstruction of ligament of knee ? ?Difficulty in walking, not elsewhere classified ? ?Decreased strength involving knee joint ? ?PERTINENT HISTORY: unremarkable  ? ?PRECAUTIONS: fall ? ?SUBJECTIVE: Pt comes today without complaints.  Continues to use SPC with ambulation. Reports she is not doing any activity or exercises at home besides ADL's.  She is not doing her HEP.  ?States she did not make it to the gym to sign up for a membership. ? ?PAIN:  ?Are you having pain? No ? ? ?OBJECTIVE: ?  ?         Today's Treatment: ? 07/21/21: ? Bike full rev 4 minutes seat 14 ? Elliptical attempted but unable to make a revolution due to weakness ? Forward and retro ambulation down hall, 30 feet 2RT each  ?Seated: ?LAQ 7.5# Lt 2X10 with 5" holds ? STS on blue foam 4x10 from standard chair and no UE ? Stand with both LE, Rt on 6" step lowering to chair with Lt LE only 2X10 ?  ?07/19/21: ? Seated: ? -STS on blue foam 4x10 ? Standing: ? -SLS stance Lt LE on blue foam w/ 3 direction toe tapping 10X ?   ?07/12/21: ?  Seated: ? -STS on blue foam 4x10 ? -Cable resisted hamstring curls #1 2x14 ? Neuro re-ed: ? -SLS stance on blue foam w/ 3 direction toe tapping 5x6 ?  ?            07/12/21: ?            Recumbent bike seat 15 on 4 minutes warm up and ROM ?            Attempted wall slides but too painful ?            Supine SLR with 5#  2X10 ?            Sidelying hip abduction 5#  2X10 ?            Prone hip extension 5# 2X10 ?            Prone knee flexion 5# 2X10 ?            LAQ 5# 2X10 ?  ?             ?            07/05/21: ?                        Aerobic: ?                        -Recumbent bike x5.15min ?                        Seated: ?-Sit to stands on blue foam 4x6 ?Standing: ?-Reverse lunges in PBs 10x33ft ?-Stair negotiation 3x4 ?                         ?   ?06/30/21: ?PROGRESS NOTE COMPLETED (see this date for measures)  ?  ?Exercises:  Side lying abduction  4# x 10; Hip adduction 2# x 10  ?                   Prone ham curl 2# x 10  ?                   Prone hip extension 2# LT ; 4# rt x 10  ?  ?                   Standing:  wall sit x 15" x 5  ?                                    Heel raises x 15 ?                                    Squat x 15 ?                    ?  PT Education - 06/28/21 1529   ?  ?  Education Details The need to push self, be more active; work on quad strengthening and compliance with HEP; pt with questions regarding walking in sand at beach and therapist did not recommend this due to weakness of LE  ?  Person(s) Educated Patient   ?  Methods Explanation   ?  Comprehension Verbalized  understanding   ?  ?   ?  ?  ?   ?  ?  ?  PT Short Term Goals - 06/01/21 1043   ?  ?       ?       ?  PT SHORT TERM GOAL #1  ?  Title Patient will achieve and maintain a Lt knee AROM equal to that of the Rt knee.   ?  Time 6   ?  Period Weeks   ?  Status Achieved   ?  Target Date 07/04/21   ?       ?  PT SHORT TERM GOAL #2  ?  Title Patient will demonstrate a normalized gait during a 2MWT and without an AD.   ?  Time 6   ?  Period Weeks   ?  Status On-going   ?  Target Date 07/04/21   ?       ?  PT SHORT TERM GOAL #3  ?  Title Patient will have no swelling/pain following exercise.   ?  Time 6   ?  Period Weeks   ?  Status On-going   ?  Target Date 07/04/21   ?  ?   ?  ?  ?   ?  ?  ?  ?  PT Long Term Goals - 06/01/21 1043   ?  ?       ?       ?  PT LONG TERM GOAL #1  ?  Title Patient will report no episodes of instability during normal household tasks.   ?  Time 12   ?  Period Weeks   ?  Status Achieved   ?  Target Date 08/15/21   ?       ?  PT LONG TERM GOAL #2  ?  Title Patient will be able to evenly distribute BW through the LEs during 10 consecutive BW squats without pain and to a depth of at least 60 degrees of knee flexion.   ?  Time 12   ?  Period Weeks   ?   Status On-going   ?  Target Date 08/15/21   ?       ?  PT LONG TERM GOAL #3  ?  Title Lt quad extension strength will be 80% or greater of that of the Rt knee.   ?  Time 12   ?  Period Weeks   ?  Status On-going   ?  Target Date 08/15/21   ?       ?  PT LONG TERM GOAL #4  ?  Title Lt quad flexion strength will be 80% or greater of that of the Rt knee.   ?  Time 12   ?  Period Weeks   ?  Status On-going   ?  Target Date 08/15/21   ?       ?  PT LONG TERM GOAL #5  ?  Title Patient will be able to negotiate a normal flight of stairs(12-16 steps) reciprocally with the use of 1 hand rail and without an onset of Lt knee pain.   ?  Time 12   ?  Period Weeks   ?  Status On-going   ?  Target Date 08/15/21   ?  ?   ?  ?  ?   ?  ?  ?  ?  ?  ?  ?  ?  Plan - 06/30/21   ?  ?  Clinical Impression Statement Began with bike for warm up and attempted elliptical.  Pt with difficulty getting in position and then was unable to negotiate revolution due to weakness. Resumed LAQ with additional weight of 7.5# to work on focused quad strengthening.  Pt currently in week 18 s/p ACL protocol.  Discussed with patient that she is far behind and she MUST increase her activity outside of therapy to improve.  Continued to push strength but limited by fatigue.  Added forward and retro gait down hall with noted fatigue completing 120 feet requiring a rest break. Continued with squatting activity with Rt LE on step working on eccentric lowering with Lt only. Pt without any pain completing exercises today only weakness. PT will continue to benefit from skilled physical therapy.  ?  Personal Factors and Comorbidities Fitness   ?  Examination-Activity Limitations Bathing;Bed Mobility;Bend;Caring for Others;Carry;Dressing;Hygiene/Grooming;Lift;Toileting;Stand;Stairs;Squat;Sit;Locomotion Level;Transfers   ?  Examination-Participation Restrictions Cleaning;Community Activity;Driving;Interpersonal Relationship;Shop;Yard Work;Occupation;Laundry   ?   Stability/Clinical Decision Making Stable/Uncomplicated   ?  Rehab Potential Fair   ?  PT Frequency 2x / week   ?  PT Duration 12 weeks   ?  PT Treatment/Interventions ADLs/Self Care Home Management;DME Instruction;Electrical

## 2021-07-28 ENCOUNTER — Ambulatory Visit (HOSPITAL_COMMUNITY): Payer: Medicaid Other | Admitting: Physical Therapy

## 2021-07-28 ENCOUNTER — Encounter (HOSPITAL_COMMUNITY): Payer: Self-pay | Admitting: Physical Therapy

## 2021-07-28 DIAGNOSIS — R29898 Other symptoms and signs involving the musculoskeletal system: Secondary | ICD-10-CM

## 2021-07-28 DIAGNOSIS — R262 Difficulty in walking, not elsewhere classified: Secondary | ICD-10-CM

## 2021-07-28 DIAGNOSIS — Z9889 Other specified postprocedural states: Secondary | ICD-10-CM | POA: Diagnosis not present

## 2021-07-28 NOTE — Therapy (Signed)
?OUTPATIENT PHYSICAL THERAPY TREATMENT NOTE ? ? ?Patient Name: Meagan Oneill ?MRN: [id number] ?DOB:[birth year], 31 y.o., female ?Today's Date: 07/28/2021 ? ?PCP: Patient, No Pcp Per (Inactive) ?REFERRING PROVIDER: Adonis Huguenin, NP ? ?END OF SESSION:  ? PT End of Session - 07/28/21 1451   ? ? Visit Number 17   ? Number of Visits 24   ? Date for PT Re-Evaluation 08/15/21   ? Authorization Type Self Pay   ? Progress Note Due on Visit 20   ? PT Start Time 1451   ? PT Stop Time 1529   ? PT Time Calculation (min) 38 min   ? Equipment Utilized During Treatment Left knee immobilizer   removed during sessin  ? Activity Tolerance Patient tolerated treatment well   ? Behavior During Therapy [facility] for tasks assessed/performed   ? ?  ?  ? ?  ? ? ? ? ?Past Medical History:  ?Diagnosis Date  ? Anemia   ? Asthma   ? no inhaler  ? Environmental allergies   ? Hypertension   ? ?Past Surgical History:  ?Procedure Laterality Date  ? I & D EXTREMITY Left 05/13/2021  ? Procedure: DEBRIDEMENT LEFT KNEE;  Surgeon: Nadara Mustard, MD;  Location: [facility] OR;  Service: Orthopedics;  Laterality: Left;  ? KNEE ARTHROSCOPY WITH ANTERIOR CRUCIATE LIGAMENT (ACL) REPAIR WITH HAMSTRING GRAFT Left 03/25/2021  ? Procedure: left knee anterior cruciate ligament, posterior cruciate ligment, posterolateral corner reconstruction arthroscopy; allograft ligaments for reconstruction, meniscal debridement vs repair;  Surgeon: Cammy Copa, MD;  Location: [facility] OR;  Service: Orthopedics;  Laterality: Left;  ? WISDOM TOOTH EXTRACTION    ? ?Patient Active Problem List  ? Diagnosis Date Noted  ? Dehiscence of incision, sequela   ? Open knee wound, left, sequela   ? Knee dislocation 04/05/2021  ? Severe obesity (BMI >= 40) (HCC) 04/05/2021  ? Rupture of posterior cruciate ligament of left knee   ? Left anterior cruciate ligament tear   ? Acute lateral meniscus tear of left knee   ? Injury of posterolateral corner of knee, left, subsequent encounter   ? 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 S/P left knee surgery 03/25/2021  ? ? ?REFERRING DIAG: S/P reconstruction of ligament of knee ? ?THERAPY DIAG:  ?S/P reconstruction of ligament of knee ? ?Difficulty in walking, not elsewhere classified ? ?Decreased strength involving knee joint ? ?PERTINENT HISTORY: unremarkable  ? ?PRECAUTIONS: fall ? ?SUBJECTIVE: Still having trouble with strength. Felt like her knee shifted when she was getting into bed the other day. Knee hasn't been painful  ? ?PAIN:  ?Are you having pain? No ? ? ?OBJECTIVE: ?  ?         Today's Treatment: ?07/28/21 ?Step up 4 inch 1x 10; 6 inch 2x 10 LLE ?Wall sit 2x 5 with 3-5 second holds ?Lateral step down 1x 5 unable on 4 inch step - discontinued ?Lunge 2x 10 LLE ?STS with foam under R foot 2x 10  ?Standing hip abduction 2x 10 bilateral green band at knees ?  ? ?07/21/21: ? Bike full rev 4 minutes seat 14 ? Elliptical attempted but unable to make a revolution due to weakness ? Forward and retro ambulation down hall, 30 feet 2RT each  ?Seated: ?LAQ 7.5# Lt 2X10 with 5" holds ? STS on blue foam 4x10 from standard chair and no UE ? Stand with both LE, Rt on 6" step lowering to chair with Lt LE  only 2X10 ?  ?07/19/21: ? Seated: ? -STS on blue foam 4x10 ? Standing: ? -SLS stance Lt LE on blue foam w/ 3 direction toe tapping 10X ?   ?07/12/21: ? Seated: ? -STS on blue foam 4x10 ? -Cable resisted hamstring curls #1 2x14 ? Neuro re-ed: ? -SLS stance on blue foam w/ 3 direction toe tapping 5x6 ?  ?            07/12/21: ?            Recumbent bike seat 15 on 4 minutes warm up and ROM ?            Attempted wall slides but too painful ?            Supine SLR with 5#  2X10 ?            Sidelying hip abduction 5#  2X10 ?            Prone hip extension 5# 2X10 ?            Prone knee flexion 5# 2X10 ?            LAQ 5# 2X10 ?  ?             ?            07/05/21: ?                        Aerobic: ?                        -Recumbent bike x5.555min ?                         Seated: ?-Sit to stands on blue foam 4x6 ?Standing: ?-Reverse lunges in PBs 10x615ft ?-Stair negotiation 3x4 ?                         ?  ?06/30/21: ?PROGRESS NOTE COMPLETED (see this date for measures)  ?  ?Exercises:  Side lying abduction  4# x 10; Hip adduction 2# x 10  ?                   Prone ham curl 2# x 10  ?                   Prone hip extension 2# LT ; 4# rt x 10  ?  ?                   Standing:  wall sit x 15" x 5  ?                                    Heel raises x 15 ?                                    Squat x 15 ?                    ?  PT Education - 06/28/21 1529   ?  ?  Education Details HEP  ?  Person(s) Educated Patient   ?  Methods Explanation   ?  Comprehension Verbalized understanding   ?  ?   ?  ?  ?   ?  ?  ?  PT Short Term Goals - 06/01/21 1043   ?  ?       ?       ?  PT SHORT TERM GOAL #1  ?  Title Patient will achieve and maintain a Lt knee AROM equal to that of the Rt knee.   ?  Time 6   ?  Period Weeks   ?  Status Achieved   ?  Target Date 07/04/21   ?       ?  PT SHORT TERM GOAL #2  ?  Title Patient will demonstrate a normalized gait during a and without an AD.   ?  Time 6   ?  Period Weeks   ?  Status On-going   ?  Target Date 07/04/21   ?       ?  PT SHORT TERM GOAL #3  ?  Title Patient will have no swelling/pain following exercise.   ?  Time 6   ?  Period Weeks   ?  Status On-going   ?  Target Date 07/04/21   ?  ?   ?  ?  ?   ?  ?  ?  ?  PT Long Term Goals - 06/01/21 1043   ?  ?       ?       ?  PT LONG TERM GOAL #1  ?  Title Patient will report no episodes of instability during normal household tasks.   ?  Time 12   ?  Period Weeks   ?  Status Achieved   ?  Target Date 08/15/21   ?       ?  PT LONG TERM GOAL #2  ?  Title Patient will be able to evenly distribute BW through the LEs during 10 consecutive BW squats without pain and to a depth of at least 60 degrees of knee flexion.   ?  Time 12   ?  Period Weeks   ?  Status On-going   ?  Target Date 08/15/21   ?       ?  PT LONG  TERM GOAL #3  ?  Title Lt quad extension strength will be 80% or greater of that of the Rt knee.   ?  Time 12   ?  Period Weeks   ?  Status On-going   ?  Target Date 08/15/21   ?       ?  PT LONG TERM GOAL #4  ?  Title Lt quad flexion strength will be 80% or greater of that of the Rt knee.   ?  Time 12   ?  Period Weeks   ?  Status On-going   ?  Target Date 08/15/21   ?       ?  PT LONG TERM GOAL #5  ?  Title Patient will be able to negotiate a normal flight of stairs(12-16 steps) reciprocally with the use of 1 hand rail and without an onset of Lt knee pain.   ?  Time 12   ?  Period Weeks   ?  Status On-going   ?  Target Date 08/15/21   ?  ?   ?  ?  ?   ?  ?  ?  ?  ?  ?  ?  ?  Plan - 06/30/21   ?  ?  Clinical Impression Statement Patient concerned about knee after issue with getting  into bed the other night. Patient anterior drawer WFL, no excessive patellar mobility and no other ligamentous laxity present or tenderness to palpation. Tolerates increased step height. Focused on quad strength today, she is unable to perform lateral step down with proper mechanics due to impaired quad strength and motor control. Notes moderate fatigue at end of session. Patient will continue to benefit from physical therapy in order to improve function and reduce impairment. ?   ?  Personal Factors and Comorbidities Fitness   ?  Examination-Activity Limitations Bathing;Bed Mobility;Bend;Caring for Others;Carry;Dressing;Hygiene/Grooming;Lift;Toileting;Stand;Stairs;Squat;Sit;Locomotion Level;Transfers   ?  Examination-Participation Restrictions Cleaning;Community Activity;Driving;Interpersonal Relationship;Shop;Yard Work;Occupation;Laundry   ?  Stability/Clinical Decision Making Stable/Uncomplicated   ?  Rehab Potential Fair   ?  PT Frequency 2x / week   ?  PT Duration 12 weeks   ?  PT Treatment/Interventions ADLs/Self Care Home Management;DME Instruction;Electrical Stimulation;Neuromuscular re-education;Balance training;Therapeutic  exercise;Therapeutic activities;Functional mobility training;Stair training;Gait training;Patient/family education;Manual techniques;Passive range of motion   ?  PT Next Visit Plan Continue to increase Lt

## 2021-08-02 ENCOUNTER — Ambulatory Visit (HOSPITAL_COMMUNITY): Payer: Medicaid Other | Admitting: Physical Therapy

## 2021-08-02 DIAGNOSIS — Z9889 Other specified postprocedural states: Secondary | ICD-10-CM | POA: Diagnosis not present

## 2021-08-02 DIAGNOSIS — R29898 Other symptoms and signs involving the musculoskeletal system: Secondary | ICD-10-CM

## 2021-08-02 DIAGNOSIS — R262 Difficulty in walking, not elsewhere classified: Secondary | ICD-10-CM

## 2021-08-02 NOTE — Therapy (Signed)
?OUTPATIENT PHYSICAL THERAPY TREATMENT NOTE ? ? ?Patient Name: Meagan Oneill ?MRN: [id number] ?DOB:1990/10/07, 31 y.o., female ?Today's Date: 08/02/2021 ? ?PCP: Patient, No Pcp Per (Inactive) ?REFERRING PROVIDER: Adonis Huguenin, NP ? ?END OF SESSION:  ? PT End of Session - 08/02/21 1456   ? ? Visit Number 18   ? Number of Visits 24   ? Date for PT Re-Evaluation 08/15/21   ? Authorization Type Self Pay   ? Progress Note Due on Visit 20   ? PT Start Time 1453   ? PT Stop Time 1532   ? PT Time Calculation (min) 39 min   ? Equipment Utilized During Treatment Left knee immobilizer   removed during sessin  ? Activity Tolerance Patient tolerated treatment well   ? Behavior During Therapy [facility] for tasks assessed/performed   ? ?  ?  ? ?  ? ? ? ? ?Past Medical History:  ?Diagnosis Date  ? Anemia   ? Asthma   ? no inhaler  ? Environmental allergies   ? Hypertension   ? ?Past Surgical History:  ?Procedure Laterality Date  ? I & D EXTREMITY Left 05/13/2021  ? Procedure: DEBRIDEMENT LEFT KNEE;  Surgeon: Nadara Mustard, MD;  Location: [facility] OR;  Service: Orthopedics;  Laterality: Left;  ? KNEE ARTHROSCOPY WITH ANTERIOR CRUCIATE LIGAMENT (ACL) REPAIR WITH HAMSTRING GRAFT Left 03/25/2021  ? Procedure: left knee anterior cruciate ligament, posterior cruciate ligment, posterolateral corner reconstruction arthroscopy; allograft ligaments for reconstruction, meniscal debridement vs repair;  Surgeon: Cammy Copa, MD;  Location: [facility] OR;  Service: Orthopedics;  Laterality: Left;  ? WISDOM TOOTH EXTRACTION    ? ?Patient Active Problem List  ? Diagnosis Date Noted  ? Dehiscence of incision, sequela   ? Open knee wound, left, sequela   ? Knee dislocation 04/05/2021  ? Severe obesity (BMI >= 40) (HCC) 04/05/2021  ? Rupture of posterior cruciate ligament of left knee   ? Left anterior cruciate ligament tear   ? Acute lateral meniscus tear of left knee   ? Injury of posterolateral corner of knee, left, subsequent encounter   ? 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 S/P left knee surgery 03/25/2021  ? ? ?REFERRING DIAG: S/P reconstruction of ligament of knee ? ?THERAPY DIAG:  ?S/P reconstruction of ligament of knee ? ?Difficulty in walking, not elsewhere classified ? ?Decreased strength involving knee joint ? ?PERTINENT HISTORY: unremarkable  ? ?PRECAUTIONS: fall ? ?SUBJECTIVE: Pt reports no episodes of her knee shifting since last week.  States she went by the [facility] and got papers to sign up for a membership.  ? ?PAIN:  ?Are you having pain? No ? ? ?OBJECTIVE: ?  ?         Today's Treatment: ? ? 08/02/21: ? Bike seat seat 14 level 5 minutes ? Forward step 4" with opp knee drive [id number] with min 1 UE assist ? Forward step down 4" with heel strike, bil UE for balance/control 2X10 ? Lateral eccentric lowering 2" step Lt with 1 UE assist ? STS foam under Rt foot 2X10 no UE assist ?  ?  ?  ?07/28/21 ?Step up 4 inch 1x 10; 6 inch 2x 10 LLE ?Wall sit 2x 5 with 3-5 second holds ?Lateral step down 1x 5 unable on 4 inch step - discontinued ?Lunge 2x 10 LLE ?STS with foam under R foot 2x 10  ?Standing hip abduction 2x 10 bilateral green band at knees ?  ? ?  07/21/21: ? Bike full rev 4 minutes seat 14 ? Elliptical attempted but unable to make a revolution due to weakness ? Forward and retro ambulation down hall, 30 feet 2RT each  ?Seated: ?LAQ 7.5# Lt 2X10 with 5" holds ? STS on blue foam 4x10 from standard chair and no UE ? Stand with both LE, Rt on 6" step lowering to chair with Lt LE only 2X10 ?  ?07/19/21: ? Seated: ? -STS on blue foam 4x10 ? Standing: ? -SLS stance Lt LE on blue foam w/ 3 direction toe tapping 10X ?   ?07/12/21: ? Seated: ? -STS on blue foam 4x10 ? -Cable resisted hamstring curls #1 2x14 ? Neuro re-ed: ? -SLS stance on blue foam w/ 3 direction toe tapping 5x6 ?  ?            07/12/21: ?            Recumbent bike seat 15 on 4 minutes warm up and ROM ?            Attempted wall slides but too painful ?            Supine SLR with 5#  2X10 ?             Sidelying hip abduction 5#  2X10 ?            Prone hip extension 5# 2X10 ?            Prone knee flexion 5# 2X10 ?            LAQ 5# 2X10 ?  ?             ?            07/05/21: ?                        Aerobic: ?                        -Recumbent bike x5.985min ?                        Seated: ?-Sit to stands on blue foam 4x6 ?Standing: ?-Reverse lunges in PBs 10x3815ft ?-Stair negotiation 3x4 ?                         ?  ?06/30/21: ?PROGRESS NOTE COMPLETED (see this date for measures)  ?  ?Exercises:  Side lying abduction  4# x 10; Hip adduction 2# x 10  ?                   Prone ham curl 2# x 10  ?                   Prone hip extension 2# LT ; 4# rt x 10  ?  ?                   Standing:  wall sit x 15" x 5  ?                                    Heel raises x 15 ?  Squat x 15 ?                    ?  PT Education - 06/28/21 1529   ?  ?  Education Details HEP  ?  Person(s) Educated Patient   ?  Methods Explanation   ?  Comprehension Verbalized understanding   ?  ?   ?  ?  ?   ?  ?  ?  PT Short Term Goals - 06/01/21 1043   ?  ?       ?       ?  PT SHORT TERM GOAL #1  ?  Title Patient will achieve and maintain a Lt knee AROM equal to that of the Rt knee.   ?  Time 6   ?  Period Weeks   ?  Status Achieved   ?  Target Date 07/04/21   ?       ?  PT SHORT TERM GOAL #2  ?  Title Patient will demonstrate a normalized gait during a and without an AD.   ?  Time 6   ?  Period Weeks   ?  Status On-going   ?  Target Date 07/04/21   ?       ?  PT SHORT TERM GOAL #3  ?  Title Patient will have no swelling/pain following exercise.   ?  Time 6   ?  Period Weeks   ?  Status On-going   ?  Target Date 07/04/21   ?  ?   ?  ?  ?   ?  ?  ?  ?  PT Long Term Goals - 06/01/21 1043   ?  ?       ?       ?  PT LONG TERM GOAL #1  ?  Title Patient will report no episodes of instability during normal household tasks.   ?  Time 12   ?  Period Weeks   ?  Status Achieved   ?  Target Date 08/15/21   ?        ?  PT LONG TERM GOAL #2  ?  Title Patient will be able to evenly distribute BW through the LEs during 10 consecutive BW squats without pain and to a depth of at least 60 degrees of knee flexion.   ?  Time 12   ?  Period Weeks   ?  Status On-going   ?  Target Date 08/15/21   ?       ?  PT LONG TERM GOAL #3  ?  Title Lt quad extension strength will be 80% or greater of that of the Rt knee.   ?  Time 12   ?  Period Weeks   ?  Status On-going   ?  Target Date 08/15/21   ?       ?  PT LONG TERM GOAL #4  ?  Title Lt quad flexion strength will be 80% or greater of that of the Rt knee.   ?  Time 12   ?  Period Weeks   ?  Status On-going   ?  Target Date 08/15/21   ?       ?  PT LONG TERM GOAL #5  ?  Title Patient will be able to negotiate a normal flight of stairs(12-16 steps) reciprocally with the use of 1 hand rail and without an onset of  Lt knee pain.   ?  Time 12   ?  Period Weeks   ?  Status On-going   ?  Target Date 08/15/21   ?  ?   ?  ?  ?   ?  ?  ?  ?  ?  ?  ?  ?  Plan - 06/30/21   ?  ?  Clinical Impression Statement Attempted to complete forward step up with knee drive without UE assist, however does not have the balance or strength to complete. Noted challenge to complete forward step up with knee drives.  Continued with eccentric focus with forward and lateral step downs.  Minimal control and inability to use mm correctly due to weakness.  Began Lt LE leg press with eccentric lowering with noted challenge.  Again encouraged to increase activity outside of therapy.Patient will continue to benefit from physical therapy in order to improve function and reduce impairment. ?   ?  Personal Factors and Comorbidities Fitness   ?  Examination-Activity Limitations Bathing;Bed Mobility;Bend;Caring for Others;Carry;Dressing;Hygiene/Grooming;Lift;Toileting;Stand;Stairs;Squat;Sit;Locomotion Level;Transfers   ?  Examination-Participation Restrictions Cleaning;Community Activity;Driving;Interpersonal Relationship;Shop;Yard  Work;Occupation;Laundry   ?  Stability/Clinical Decision Making Stable/Uncomplicated   ?  Rehab Potential Fair   ?  PT Frequency 2x / week   ?  PT Duration 12 weeks   ?  PT Treatment/Interventions ADLs/S

## 2021-08-04 ENCOUNTER — Ambulatory Visit (HOSPITAL_COMMUNITY): Payer: Medicaid Other

## 2021-08-04 ENCOUNTER — Encounter (HOSPITAL_COMMUNITY): Payer: Self-pay

## 2021-08-04 DIAGNOSIS — Z9889 Other specified postprocedural states: Secondary | ICD-10-CM

## 2021-08-04 DIAGNOSIS — Z1388 Encounter for screening for disorder due to exposure to contaminants: Secondary | ICD-10-CM | POA: Diagnosis not present

## 2021-08-04 DIAGNOSIS — Z113 Encounter for screening for infections with a predominantly sexual mode of transmission: Secondary | ICD-10-CM | POA: Diagnosis not present

## 2021-08-04 DIAGNOSIS — R262 Difficulty in walking, not elsewhere classified: Secondary | ICD-10-CM

## 2021-08-04 DIAGNOSIS — N760 Acute vaginitis: Secondary | ICD-10-CM | POA: Diagnosis not present

## 2021-08-04 DIAGNOSIS — Z3009 Encounter for other general counseling and advice on contraception: Secondary | ICD-10-CM | POA: Diagnosis not present

## 2021-08-04 DIAGNOSIS — Z0389 Encounter for observation for other suspected diseases and conditions ruled out: Secondary | ICD-10-CM | POA: Diagnosis not present

## 2021-08-04 NOTE — Therapy (Signed)
?OUTPATIENT PHYSICAL THERAPY TREATMENT NOTE ? ? ?Patient Name: Meagan Oneill ?MRN: [id number] ?DOB:[birth year], 31 y.o., female ?Today's Date: 08/04/2021 ? ?PCP: Patient, No Pcp Per (Inactive) ?REFERRING PROVIDER: Adonis Huguenin, NP ? ?END OF SESSION:  ? PT End of Session - 08/04/21 1500   ? ? Visit Number 19   ? Number of Visits 24   ? Date for PT Re-Evaluation 08/15/21   ? Authorization Type Self Pay   ? Progress Note Due on Visit 20   ? PT Start Time 1457   late arrival  ? PT Stop Time 1529   ? PT Time Calculation (min) 32 min   ? Activity Tolerance Patient tolerated treatment well   ? Behavior During Therapy [facility] for tasks assessed/performed   ? ?  ?  ? ?  ? ? ? ? ? ?Past Medical History:  ?Diagnosis Date  ? Anemia   ? Asthma   ? no inhaler  ? Environmental allergies   ? Hypertension   ? ?Past Surgical History:  ?Procedure Laterality Date  ? I & D EXTREMITY Left 05/13/2021  ? Procedure: DEBRIDEMENT LEFT KNEE;  Surgeon: Nadara Mustard, MD;  Location: [facility] OR;  Service: Orthopedics;  Laterality: Left;  ? KNEE ARTHROSCOPY WITH ANTERIOR CRUCIATE LIGAMENT (ACL) REPAIR WITH HAMSTRING GRAFT Left 03/25/2021  ? Procedure: left knee anterior cruciate ligament, posterior cruciate ligment, posterolateral corner reconstruction arthroscopy; allograft ligaments for reconstruction, meniscal debridement vs repair;  Surgeon: Cammy Copa, MD;  Location: [facility] OR;  Service: Orthopedics;  Laterality: Left;  ? WISDOM TOOTH EXTRACTION    ? ?Patient Active Problem List  ? Diagnosis Date Noted  ? Dehiscence of incision, sequela   ? Open knee wound, left, sequela   ? Knee dislocation 04/05/2021  ? Severe obesity (BMI >= 40) (HCC) 04/05/2021  ? Rupture of posterior cruciate ligament of left knee   ? Left anterior cruciate ligament tear   ? Acute lateral meniscus tear of left knee   ? Injury of posterolateral corner of knee, left, subsequent encounter   ? 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 S/P left knee surgery  03/25/2021  ? ? ?REFERRING DIAG: S/P reconstruction of ligament of knee ? ?THERAPY DIAG:  ?S/P reconstruction of ligament of knee ? ?Difficulty in walking, not elsewhere classified ? ?PERTINENT HISTORY: unremarkable  ? ?PRECAUTIONS: fall ? ?SUBJECTIVE: Late arrival, stated she was at another apt and went over.  No reports of pain today, legs feel  ? ?PAIN:  ?Are you having pain? No ? ? ?OBJECTIVE: ?  ?         Today's Treatment: ? 08/04/21: ? Squat then heel raise 2x 10 no HHA ? STS foam under Rt foot 2X10 no UE assist ? SLS 3" max  ? Vector stance 3x5" ? Lateral eccentric lowering 2" step Lt with 1 UE assist ? Forward step 4" 2X10 with min 1 UE assist ? Forward step down 4" with heel strike, bil UE for balance/control 2X10 ? Wall sit 2x 5 with 3-5 second holds ? Leg press Lt LE only 2x 10 2Pl slow control ? ? 08/02/21: ? Bike seat seat 14 level 5 minutes ? Forward step 4" with opp knee drive 3O67 with min 1 UE assist ? Forward step down 4" with heel strike, bil UE for balance/control 2X10 ? Lateral eccentric lowering 2" step Lt with 1 UE assist ? STS foam under Rt foot 2X10 no UE assist ?  ?  ?  ?  07/28/21 ?Step up 4 inch 1x 10; 6 inch 2x 10 LLE ?Wall sit 2x 5 with 3-5 second holds ?Lateral step down 1x 5 unable on 4 inch step - discontinued ?Lunge 2x 10 LLE ?STS with foam under R foot 2x 10  ?Standing hip abduction 2x 10 bilateral green band at knees ?  ? ?07/21/21: ? Bike full rev 4 minutes seat 14 ? Elliptical attempted but unable to make a revolution due to weakness ? Forward and retro ambulation down hall, 30 feet 2RT each  ?Seated: ?LAQ 7.5# Lt 2X10 with 5" holds ? STS on blue foam 4x10 from standard chair and no UE ? Stand with both LE, Rt on 6" step lowering to chair with Lt LE only 2X10 ?  ?07/19/21: ? Seated: ? -STS on blue foam 4x10 ? Standing: ? -SLS stance Lt LE on blue foam w/ 3 direction toe tapping 10X ?   ?07/12/21: ? Seated: ? -STS on blue foam 4x10 ? -Cable resisted hamstring curls #1 2x14 ? Neuro  re-ed: ? -SLS stance on blue foam w/ 3 direction toe tapping 5x6 ?  ?            07/12/21: ?            Recumbent bike seat 15 on 4 minutes warm up and ROM ?            Attempted wall slides but too painful ?            Supine SLR with 5#  2X10 ?            Sidelying hip abduction 5#  2X10 ?            Prone hip extension 5# 2X10 ?            Prone knee flexion 5# 2X10 ?            LAQ 5# 2X10 ?  ?             ?            07/05/21: ?                        Aerobic: ?                        -Recumbent bike x5.535min ?                        Seated: ?-Sit to stands on blue foam 4x6 ?Standing: ?-Reverse lunges in PBs 10x5515ft ?-Stair negotiation 3x4 ?                         ?  ?06/30/21: ?PROGRESS NOTE COMPLETED (see this date for measures)  ?  ?Exercises:  Side lying abduction  4# x 10; Hip adduction 2# x 10  ?                   Prone ham curl 2# x 10  ?                   Prone hip extension 2# LT ; 4# rt x 10  ?  ?                   Standing:  wall sit x 15" x 5  ?  Heel raises x 15 ?                                    Squat x 15 ?                    ?  PT Education - 06/28/21 1529   ?  ?  Education Details HEP  ?  Person(s) Educated Patient   ?  Methods Explanation   ?  Comprehension Verbalized understanding   ?  ?   ?  ?  ?   ?  ?  ?  PT Short Term Goals - 06/01/21 1043   ?  ?       ?       ?  PT SHORT TERM GOAL #1  ?  Title Patient will achieve and maintain a Lt knee AROM equal to that of the Rt knee.   ?  Time 6   ?  Period Weeks   ?  Status Achieved   ?  Target Date 07/04/21   ?       ?  PT SHORT TERM GOAL #2  ?  Title Patient will demonstrate a normalized gait during a and without an AD.   ?  Time 6   ?  Period Weeks   ?  Status On-going   ?  Target Date 07/04/21   ?       ?  PT SHORT TERM GOAL #3  ?  Title Patient will have no swelling/pain following exercise.   ?  Time 6   ?  Period Weeks   ?  Status On-going   ?  Target Date 07/04/21   ?  ?   ?  ?  ?   ?  ?  ?  ?  PT Long  Term Goals - 06/01/21 1043   ?  ?       ?       ?  PT LONG TERM GOAL #1  ?  Title Patient will report no episodes of instability during normal household tasks.   ?  Time 12   ?  Period Weeks   ?  Status Achieved   ?  Target Date 08/15/21   ?       ?  PT LONG TERM GOAL #2  ?  Title Patient will be able to evenly distribute BW through the LEs during 10 consecutive BW squats without pain and to a depth of at least 60 degrees of knee flexion.   ?  Time 12   ?  Period Weeks   ?  Status On-going   ?  Target Date 08/15/21   ?       ?  PT LONG TERM GOAL #3  ?  Title Lt quad extension strength will be 80% or greater of that of the Rt knee.   ?  Time 12   ?  Period Weeks   ?  Status On-going   ?  Target Date 08/15/21   ?       ?  PT LONG TERM GOAL #4  ?  Title Lt quad flexion strength will be 80% or greater of that of the Rt knee.   ?  Time 12   ?  Period Weeks   ?  Status On-going   ?  Target Date 08/15/21   ?       ?  PT LONG TERM GOAL #5  ?  Title Patient will be able to negotiate a normal flight of stairs(12-16 steps) reciprocally with the use of 1 hand rail and without an onset of Lt knee pain.   ?  Time 12   ?  Period Weeks   ?  Status On-going   ?  Target Date 08/15/21   ?  ?   ?  ?  ?   ?  ?  ?  ?  ?  ?  ?  ?  Plan - 06/30/21   ?  ?  Clinical Impression Statement Added balance training for knee stability, pt unable to SLS greater than 3" max.  Added vector stance for hip stability with cueing for posture and to reduce leaning with abduction, required UE support for correct form.   Pt continues to demonstrate LE weakness with inability to complete step up training without UE support.  Reviewed current exercises to complete at home, pt stated she was thinking of joining [facility]. ?   ?  Personal Factors and Comorbidities Fitness   ?  Examination-Activity Limitations Bathing;Bed Mobility;Bend;Caring for Others;Carry;Dressing;Hygiene/Grooming;Lift;Toileting;Stand;Stairs;Squat;Sit;Locomotion Level;Transfers   ?   Examination-Participation Restrictions Cleaning;Community Activity;Driving;Interpersonal Relationship;Shop;Yard Work;Occupation;Laundry   ?  Stability/Clinical Decision Making Stable/Uncomplicated   ?  Rehab Potential Fair   ?

## 2021-08-09 ENCOUNTER — Ambulatory Visit (HOSPITAL_COMMUNITY): Payer: Worker's Compensation | Attending: Family | Admitting: Physical Therapy

## 2021-08-09 ENCOUNTER — Encounter (HOSPITAL_COMMUNITY): Payer: Self-pay | Admitting: Physical Therapy

## 2021-08-09 DIAGNOSIS — R262 Difficulty in walking, not elsewhere classified: Secondary | ICD-10-CM | POA: Diagnosis present

## 2021-08-09 DIAGNOSIS — R29898 Other symptoms and signs involving the musculoskeletal system: Secondary | ICD-10-CM | POA: Diagnosis present

## 2021-08-09 DIAGNOSIS — Z9889 Other specified postprocedural states: Secondary | ICD-10-CM | POA: Diagnosis not present

## 2021-08-09 NOTE — Therapy (Signed)
?OUTPATIENT PHYSICAL THERAPY TREATMENT NOTE ? ? ?Patient Name: Meagan Oneill ?MRN: [id number] ?DOB:December 16, 1990, 31 y.o., female ?Today's Date: 08/09/2021 ? ?PCP: Patient, No Pcp Per (Inactive) ?REFERRING PROVIDER: Suzan Slick, NP ? ?END OF SESSION:  ? PT End of Session - 08/09/21 1505   ? ? Visit Number 20   ? Number of Visits 24   ? Date for PT Re-Evaluation 08/15/21   ? Authorization Type Self Pay   ? Progress Note Due on Visit 20   ? PT Start Time 1502   ? PT Stop Time 1530   ? PT Time Calculation (min) 28 min   ? Activity Tolerance Patient tolerated treatment well   ? Behavior During Therapy [facility] for tasks assessed/performed   ? ?  ?  ? ?  ? ? ? ? ? ?Past Medical History:  ?Diagnosis Date  ? Anemia   ? Asthma   ? no inhaler  ? Environmental allergies   ? Hypertension   ? ?Past Surgical History:  ?Procedure Laterality Date  ? I & D EXTREMITY Left 05/13/2021  ? Procedure: DEBRIDEMENT LEFT KNEE;  Surgeon: Newt Minion, MD;  Location: [place];  Service: Orthopedics;  Laterality: Left;  ? KNEE ARTHROSCOPY WITH ANTERIOR CRUCIATE LIGAMENT (ACL) REPAIR WITH HAMSTRING GRAFT Left 03/25/2021  ? Procedure: left knee anterior cruciate ligament, posterior cruciate ligment, posterolateral corner reconstruction arthroscopy; allograft ligaments for reconstruction, meniscal debridement vs repair;  Surgeon: Meredith Pel, MD;  Location: [place];  Service: Orthopedics;  Laterality: Left;  ? WISDOM TOOTH EXTRACTION    ? ?Patient Active Problem List  ? Diagnosis Date Noted  ? Dehiscence of incision, sequela   ? Open knee wound, left, sequela   ? Knee dislocation 04/05/2021  ? Severe obesity (BMI >= 40) ([place]) 04/05/2021  ? Rupture of posterior cruciate ligament of left knee   ? Left anterior cruciate ligament tear   ? Acute lateral meniscus tear of left knee   ? Injury of posterolateral corner of knee, left, subsequent encounter   ? 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 S/P left knee surgery 03/25/2021   ? ? ?REFERRING DIAG: S/P reconstruction of ligament of knee ? ?THERAPY DIAG:  ?S/P reconstruction of ligament of knee ? ?Difficulty in walking, not elsewhere classified ? ?Decreased strength involving knee joint ? ?PERTINENT HISTORY: unremarkable  ? ?PRECAUTIONS: fall ? ?SUBJECTIVE:  ? ?PAIN:  ?Are you having pain? No ? ? ?OBJECTIVE: ?  ?         Today's Treatment: ? ?08/09/21 ?Bike 3 minutes level 4  ?Step up 6 inch step 2x 10  ?Wall sit 2x 5 with 3-5 second holds ?Stairs 4 inch steps alternating pattern 4 RT ?Leg press Lt LE only 5x 10 2Pl slow control ? ? 08/04/21: ? Squat then heel raise 2x 10 no HHA ? STS foam under Rt foot 2X10 no UE assist ? SLS 3" max  ? Vector stance 3x5" ? Lateral eccentric lowering 2" step Lt with 1 UE assist ? Forward step 4" 2X10 with min 1 UE assist ? Forward step down 4" with heel strike, bil UE for balance/control 2X10 ? Wall sit 2x 5 with 3-5 second holds ? Leg press Lt LE only 2x 10 2Pl slow control ? ? 08/02/21: ? Bike seat seat 14 level 5 minutes ? Forward step 4" with opp knee drive [id number] with min 1 UE assist ? Forward step down 4" with heel strike, bil UE  for balance/control 2X10 ? Lateral eccentric lowering 2" step Lt with 1 UE assist ? STS foam under Rt foot 2X10 no UE assist ?  ?  ?  ?07/28/21 ?Step up 4 inch 1x 10; 6 inch 2x 10 LLE ?Wall sit 2x 5 with 3-5 second holds ?Lateral step down 1x 5 unable on 4 inch step - discontinued ?Lunge 2x 10 LLE ?STS with foam under R foot 2x 10  ?Standing hip abduction 2x 10 bilateral green band at knees ?  ? ?07/21/21: ? Bike full rev 4 minutes seat 14 ? Elliptical attempted but unable to make a revolution due to weakness ? Forward and retro ambulation down hall, 30 feet 2RT each  ?Seated: ?LAQ 7.5# Lt 2X10 with 5" holds ? STS on blue foam 4x10 from standard chair and no UE ? Stand with both LE, Rt on 6" step lowering to chair with Lt LE only 2X10 ?  ? ?  PT Education - 06/28/21 1529   ?  ?  Education Details HEP  ?  Person(s) Educated Patient    ?  Methods Explanation   ?  Comprehension Verbalized understanding   ?  ?   ?  ?  ?   ?  ?  ?  PT Short Term Goals - 06/01/21 1043   ?  ?       ?       ?  PT SHORT TERM GOAL #1  ?  Title Patient will achieve and maintain a Lt knee AROM equal to that of the Rt knee.   ?  Time 6   ?  Period Weeks   ?  Status Achieved   ?  Target Date 07/04/21   ?       ?  PT SHORT TERM GOAL #2  ?  Title Patient will demonstrate a normalized gait during a 2MWT and without an AD.   ?  Time 6   ?  Period Weeks   ?  Status On-going   ?  Target Date 07/04/21   ?       ?  PT SHORT TERM GOAL #3  ?  Title Patient will have no swelling/pain following exercise.   ?  Time 6   ?  Period Weeks   ?  Status On-going   ?  Target Date 07/04/21   ?  ?   ?  ?  ?   ?  ?  ?  ?  PT Long Term Goals - 06/01/21 1043   ?  ?       ?       ?  PT LONG TERM GOAL #1  ?  Title Patient will report no episodes of instability during normal household tasks.   ?  Time 12   ?  Period Weeks   ?  Status Achieved   ?  Target Date 08/15/21   ?       ?  PT LONG TERM GOAL #2  ?  Title Patient will be able to evenly distribute BW through the LEs during 10 consecutive BW squats without pain and to a depth of at least 60 degrees of knee flexion.   ?  Time 12   ?  Period Weeks   ?  Status On-going   ?  Target Date 08/15/21   ?       ?  PT LONG TERM GOAL #3  ?  Title Lt quad extension strength will be 80%  or greater of that of the Rt knee.   ?  Time 12   ?  Period Weeks   ?  Status On-going   ?  Target Date 08/15/21   ?       ?  PT LONG TERM GOAL #4  ?  Title Lt quad flexion strength will be 80% or greater of that of the Rt knee.   ?  Time 12   ?  Period Weeks   ?  Status On-going   ?  Target Date 08/15/21   ?       ?  PT LONG TERM GOAL #5  ?  Title Patient will be able to negotiate a normal flight of stairs(12-16 steps) reciprocally with the use of 1 hand rail and without an onset of Lt knee pain.   ?  Time 12   ?  Period Weeks   ?  Status On-going   ?  Target Date 08/15/21   ?  ?    ?  ?  ?   ?  ?  ?  ?  ?  ?  ?  ?  Plan - 06/30/21   ?  ?  Clinical Impression Statement Patient with continued difficulty with stair exercises secondary to impaired quad strength and motor control. Patient tending to rely on hip strength to compensate. Continued to focus on quad strength today and will reassess next session. Patient will continue to benefit from physical therapy in order to improve function and reduce impairment. ? ?   ?  Personal Factors and Comorbidities Fitness   ?  Examination-Activity Limitations Bathing;Bed Mobility;Bend;Caring for Others;Carry;Dressing;Hygiene/Grooming;Lift;Toileting;Stand;Stairs;Squat;Sit;Locomotion Level;Transfers   ?  Examination-Participation Restrictions Cleaning;Community Activity;Driving;Interpersonal Relationship;Shop;Yard Work;Occupation;Laundry   ?  Stability/Clinical Decision Making Stable/Uncomplicated   ?  Rehab Potential Fair   ?  PT Frequency 2x / week   ?  PT Duration 12 weeks   ?  PT Treatment/Interventions ADLs/Self Care Home Management;DME Instruction;Electrical Stimulation;Neuromuscular re-education;Balance training;Therapeutic exercise;Therapeutic activities;Functional mobility training;Stair training;Gait training;Patient/family education;Manual techniques;Passive range of motion   ?  PT Next Visit Plan Continue to increase Lt single LE activity and improve strength in order to normalize gait and improve function.   ?  PT Home Exercise Plan Sitting Heel Slide with Towel - 3-4 x daily - 7 x weekly - 6 reps - 15s hold  Long Sitting Ankle Pumps - 5-6 x daily - 7 x weekly - 20 reps  Supine Quad Set - 2-3 x daily - 7 x weekly - 8 reps - 5s hold  Sidelying Hip Abduction - 2-3 x daily - 7 x weekly - 3 sets - 10 reps  Prone Knee Flexion - 2-3 x daily - 7 x weekly - 3 sets - 12 reps  Seated Knee Extension AROM - 2-3 x daily - 7 x weekly - 3 sets - 10 reps;  2/22 : SLR, prone hip extension , heel Raises minisquats.3/9: prone hamstring curls and hip extension,  bridge  4/20 lunge  ?  Consulted and Agree with Plan of Care Patient   ?  ?   ?  ?  ?   ?  ?  ?Patient will benefit from skilled therapeutic intervention in order to improve the following deficits and impairmen

## 2021-08-11 ENCOUNTER — Ambulatory Visit (HOSPITAL_COMMUNITY): Payer: Self-pay | Admitting: Physical Therapy

## 2021-08-11 ENCOUNTER — Encounter (HOSPITAL_COMMUNITY): Payer: Self-pay | Admitting: Physical Therapy

## 2021-08-11 DIAGNOSIS — R262 Difficulty in walking, not elsewhere classified: Secondary | ICD-10-CM

## 2021-08-11 DIAGNOSIS — R29898 Other symptoms and signs involving the musculoskeletal system: Secondary | ICD-10-CM

## 2021-08-11 DIAGNOSIS — Z9889 Other specified postprocedural states: Secondary | ICD-10-CM

## 2021-08-11 NOTE — Therapy (Signed)
?OUTPATIENT PHYSICAL THERAPY TREATMENT NOTE ? ? ?Patient Name: Meagan Oneill ?MRN: [id number] ?DOB:03-Jan-1991, 31 y.o., female ?Today's Date: 08/11/2021 ? ?PCP: Patient, No Pcp Per (Inactive) ?REFERRING PROVIDER: Suzan Slick, NP ? ? ?PHYSICAL THERAPY DISCHARGE SUMMARY ? ?Visits from Start of Care: 21 ? ?Current functional level related to goals / functional outcomes: ?See below ?  ?Remaining deficits: ?See below ?  ?Education / Equipment: ?See below  ? ?Patient agrees to discharge. Patient goals were partially met. Patient is being discharged due to  meeting goals and having HEP and education to progress independently. ? ?END OF SESSION:  ? PT End of Session - 08/11/21 1458   ? ? Visit Number 21   ? Number of Visits 24   ? Date for PT Re-Evaluation 08/15/21   ? Authorization Type Self Pay   ? Progress Note Due on Visit 20   ? PT Start Time 9924   arrives late  ? PT Stop Time 1528   ? PT Time Calculation (min) 30 min   ? Activity Tolerance Patient tolerated treatment well   ? Behavior During Therapy [facility] for tasks assessed/performed   ? ?  ?  ? ?  ? ? ? ? ? ?Past Medical History:  ?Diagnosis Date  ? Anemia   ? Asthma   ? no inhaler  ? Environmental allergies   ? Hypertension   ? ?Past Surgical History:  ?Procedure Laterality Date  ? I & D EXTREMITY Left 05/13/2021  ? Procedure: DEBRIDEMENT LEFT KNEE;  Surgeon: Newt Minion, MD;  Location: [place];  Service: Orthopedics;  Laterality: Left;  ? KNEE ARTHROSCOPY WITH ANTERIOR CRUCIATE LIGAMENT (ACL) REPAIR WITH HAMSTRING GRAFT Left 03/25/2021  ? Procedure: left knee anterior cruciate ligament, posterior cruciate ligment, posterolateral corner reconstruction arthroscopy; allograft ligaments for reconstruction, meniscal debridement vs repair;  Surgeon: Meredith Pel, MD;  Location: [place];  Service: Orthopedics;  Laterality: Left;  ? WISDOM TOOTH EXTRACTION    ? ?Patient Active Problem List  ? Diagnosis Date Noted  ? Dehiscence of incision, sequela   ? Open knee wound,  left, sequela   ? Knee dislocation 04/05/2021  ? Severe obesity (BMI >= 40) ([place]) 04/05/2021  ? Rupture of posterior cruciate ligament of left knee   ? Left anterior cruciate ligament tear   ? Acute lateral meniscus tear of left knee   ? Injury of posterolateral corner of knee, left, subsequent encounter   ? 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 S/P left knee surgery 03/25/2021  ? ? ?REFERRING DIAG: S/P reconstruction of ligament of knee ? ?THERAPY DIAG:  ?S/P reconstruction of ligament of knee ? ?Difficulty in walking, not elsewhere classified ? ?Decreased strength involving knee joint ? ?PERTINENT HISTORY: unremarkable  ? ?PRECAUTIONS: fall ? ?SUBJECTIVE: Patient states she works on her exercises. She has been cooking, walking, etc. She feels that she can do some work still but realizes it takes her doing more for it to get better.  ? ?PAIN:  ?Are you having pain? No ? ? ?OBJECTIVE: ?  ?         Today's Treatment: ? ?08/11/21 ?Bike seat seat 14 level 5 minutes ?Reassessment ?325 feet 2MWT with min/mod antalgic gait ?Squat 10 reps below 60 degrees with good mechanics ?Steps 7 inch cueing for alternating pattern 3RT with bilateral UE support, unsteady, decreased control ?Leg press unilateral 1RM: 3plates L; 5 plates R ? ? ?05/16/81 ?Bike 3 minutes level 4  ?  Step up 6 inch step 2x 10  ?Wall sit 2x 5 with 3-5 second holds ?Stairs 4 inch steps alternating pattern 4 RT ?Leg press Lt LE only 5x 10 2Pl slow control ? ? 08/04/21: ? Squat then heel raise 2x 10 no HHA ? STS foam under Rt foot 2X10 no UE assist ? SLS 3" max  ? Vector stance 3x5" ? Lateral eccentric lowering 2" step Lt with 1 UE assist ? Forward step 4" 2X10 with min 1 UE assist ? Forward step down 4" with heel strike, bil UE for balance/control 2X10 ? Wall sit 2x 5 with 3-5 second holds ? Leg press Lt LE only 2x 10 2Pl slow control ? ? 08/02/21: ? Bike seat seat 14 level 5 minutes ? Forward step 4" with opp knee drive [id number] with min 1 UE  assist ? Forward step down 4" with heel strike, bil UE for balance/control 2X10 ? Lateral eccentric lowering 2" step Lt with 1 UE assist ? STS foam under Rt foot 2X10 no UE assist ?  ?  ?  ?07/28/21 ?Step up 4 inch 1x 10; 6 inch 2x 10 LLE ?Wall sit 2x 5 with 3-5 second holds ?Lateral step down 1x 5 unable on 4 inch step - discontinued ?Lunge 2x 10 LLE ?STS with foam under R foot 2x 10  ?Standing hip abduction 2x 10 bilateral green band at knees ?  ? ?07/21/21: ? Bike full rev 4 minutes seat 14 ? Elliptical attempted but unable to make a revolution due to weakness ? Forward and retro ambulation down hall, 30 feet 2RT each  ?Seated: ?LAQ 7.5# Lt 2X10 with 5" holds ? STS on blue foam 4x10 from standard chair and no UE ? Stand with both LE, Rt on 6" step lowering to chair with Lt LE only 2X10 ?  ? ?  PT Education -   ?  ?  Education Details HEP  ?  Person(s) Educated Patient   ?  Methods Explanation   ?  Comprehension Verbalized understanding   ?  ?   ?  ?  ?   ?  ?  ?  PT Short Term Goals   ?  ?       ?       ?  PT SHORT TERM GOAL #1  ?  Title Patient will achieve and maintain a Lt knee AROM equal to that of the Rt knee.   ?  Time 6   ?  Period Weeks   ?  Status Achieved   ?  Target Date 07/04/21   ?       ?  PT SHORT TERM GOAL #2  ?  Title Patient will demonstrate a normalized gait during a 2MWT and without an AD.   ?  Time 6   ?  Period Weeks   ?  Status Partially met  ?  Target Date 07/04/21   ?       ?  PT SHORT TERM GOAL #3  ?  Title Patient will have no swelling/pain following exercise.   ?  Time 6   ?  Period Weeks   ?  Status Achieved  ?  Target Date 07/04/21   ?  ?   ?  ?  ?   ?  ?  ?  ?  PT Long Term Goals   ?  ?       ?       ?  PT LONG  TERM GOAL #1  ?  Title Patient will report no episodes of instability during normal household tasks.   ?  Time 12   ?  Period Weeks   ?  Status Achieved   ?  Target Date 08/15/21   ?       ?  PT LONG TERM GOAL #2  ?  Title Patient will be able to evenly distribute BW through  the LEs during 10 consecutive BW squats without pain and to a depth of at least 60 degrees of knee flexion.   ?  Time 12   ?  Period Weeks   ?  Status Achieved  ?  Target Date 08/15/21   ?       ?  PT LONG TERM GOAL #3  ?  Title Lt quad extension strength will be 80% or greater of that of the Rt knee.   ?  Time 12   ?  Period Weeks   ?  Status On-going   ?  Target Date 08/15/21   ?       ?  PT LONG TERM GOAL #4  ?  Title Lt quad flexion strength will be 80% or greater of that of the Rt knee.   ?  Time 12   ?  Period Weeks   ?  Status On-going   ?  Target Date 08/15/21   ?       ?  PT LONG TERM GOAL #5  ?  Title Patient will be able to negotiate a normal flight of stairs(12-16 steps) reciprocally with the use of 1 hand rail and without an onset of Lt knee pain.   ?  Time 12   ?  Period Weeks   ?  Status Not met - bilateral UE support, labored, decreased concentric/eccentric control  and strength  ?  Target Date 08/15/21   ?  ?   ?  ?  ?   ?  ?  ?  ?  ?  ?  ?  ?  Plan   ?  ?  Clinical Impression Statement Patient has met 3/3 short term goals and 2/5 long term goals with ability to complete HEP and decrease in symptoms. Patient continues to remain limited by impaired LLE strength leading to remaining goals not met. Patient educated on continuing HEP and continuing exercises at gym to promote strength gains and returning to PT if needed. Discussed with patient how to properly build strength and on being consistent with routine as she has been lacking consistency with exercise. Patient discharged from PT at this time.  ? ?   ?  Personal Factors and Comorbidities Fitness   ?  Examination-Activity Limitations Bathing;Bed Mobility;Bend;Caring for Others;Carry;Dressing;Hygiene/Grooming;Lift;Toileting;Stand;Stairs;Squat;Sit;Locomotion Level;Transfers   ?  Examination-Participation Restrictions Cleaning;Community Activity;Driving;Interpersonal Relationship;Shop;Yard Work;Occupation;Laundry   ?  Stability/Clinical Decision  Making Stable/Uncomplicated   ?  Rehab Potential Fair   ?  PT Frequency 2x / week   ?  PT Duration 12 weeks   ?  PT Treatment/Interventions ADLs/Self Care Home Management;DME Instruction;Electrical Stimulation;N

## 2021-08-15 ENCOUNTER — Ambulatory Visit (INDEPENDENT_AMBULATORY_CARE_PROVIDER_SITE_OTHER): Payer: Worker's Compensation | Admitting: Orthopedic Surgery

## 2021-08-15 ENCOUNTER — Encounter: Payer: Self-pay | Admitting: Orthopedic Surgery

## 2021-08-15 DIAGNOSIS — S81002S Unspecified open wound, left knee, sequela: Secondary | ICD-10-CM

## 2021-08-15 NOTE — Progress Notes (Signed)
? ?Post-Op Visit Note ?  ?Patient: Meagan Oneill           ?Date of Birth: May 28, 1990           ?MRN: [id number] ?Visit Date: 08/15/2021 ?PCP: Patient, No Pcp Per (Inactive) ? ? ?Assessment & Plan: ? ?Chief Complaint:  ?Chief Complaint  ?Patient presents with  ? Other  ?  left knee debridement on 05/13/2021 with Dr. Sharol Given and 03/25/21 multiligament reconstruction left knee including ACL PCL LCL and posterolateral corner ?  ? ?Visit Diagnoses:  ?1. Open knee wound, left, sequela   ? ? ?Plan: Meagan is now about 3 months out left knee debridement with Dr. Sharol Given.  She developed a bubble at the proximal aspect of her left knee incision.  Overall she has been doing reasonably well.  Has some stiffness in the knee after sitting.  Finished therapy last Tuesday.  On exam her knee remains stable in the anterior posterior plane as well as medial lateral plane.  She has been doing some strengthening on her own.  Ultrasound does show a 1 x 1 cm fluid collection.  This is under the proximal aspect of the incision on the lateral aspect of the knee.  This area is prepped and opened up today.  Irrigated with about 30 cc of saline.  Probed about a centimeter down.  No obvious deep fluctuance induration or erythema present.  I think this is a superficial infection.  Cultures obtained.  Packing provided with iodoform into the incision which only went and about 1 cm utilizing 2 inches of iodoform packing.  Come back on Wednesday for packing change and then at that point she can take the packing out on Friday and let this heal with granulation tissue inside out. ? ?Follow-Up Instructions: Return in about 2 days (around 08/17/2021).  ? ?Orders:  ?Orders Placed This Encounter  ?Procedures  ? Anaerobic and Aerobic Culture  ? ?No orders of the defined types were placed in this encounter. ? ? ?Imaging: ?No results found. ? ?PMFS History: ?Patient Active Problem List  ? Diagnosis Date Noted  ? Dehiscence of incision, sequela   ? Open knee wound, left,  sequela   ? Knee dislocation 04/05/2021  ? Severe obesity (BMI >= 40) ([place]) 04/05/2021  ? Rupture of posterior cruciate ligament of left knee   ? Left anterior cruciate ligament tear   ? Acute lateral meniscus tear of left knee   ? Injury of posterolateral corner of knee, left, subsequent encounter   ? 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 S/P left knee surgery 03/25/2021  ? ?Past Medical History:  ?Diagnosis Date  ? Anemia   ? Asthma   ? no inhaler  ? Environmental allergies   ? Hypertension   ?  ?Family History  ?Problem Relation Age of Onset  ? Hypertension Mother   ? Diabetes Mother   ? Hypertension Sister   ?  ?Past Surgical History:  ?Procedure Laterality Date  ? I & D EXTREMITY Left 05/13/2021  ? Procedure: DEBRIDEMENT LEFT KNEE;  Surgeon: Newt Minion, MD;  Location: [place];  Service: Orthopedics;  Laterality: Left;  ? KNEE ARTHROSCOPY WITH ANTERIOR CRUCIATE LIGAMENT (ACL) REPAIR WITH HAMSTRING GRAFT Left 03/25/2021  ? Procedure: left knee anterior cruciate ligament, posterior cruciate ligment, posterolateral corner reconstruction arthroscopy; allograft ligaments for reconstruction, meniscal debridement vs repair;  Surgeon: Meredith Pel, MD;  Location: [place];  Service: Orthopedics;  Laterality: Left;  ?  WISDOM TOOTH EXTRACTION    ? ?Social History  ? ?Occupational History  ? Not on file  ?Tobacco Use  ? Smoking status: Never  ? Smokeless tobacco: Never  ?Vaping Use  ? Vaping Use: Never used  ?Substance and Sexual Activity  ? Alcohol use: Not Currently  ?  Alcohol/week: 1.0 standard drink  ?  Types: 1 Glasses of wine per week  ?  Comment: occas.  ? Drug use: No  ? Sexual activity: Yes  ?  Birth control/protection: None  ? ? ? ?

## 2021-08-16 ENCOUNTER — Ambulatory Visit (HOSPITAL_COMMUNITY): Payer: Self-pay | Admitting: Physical Therapy

## 2021-08-17 ENCOUNTER — Encounter: Payer: Self-pay | Admitting: Orthopedic Surgery

## 2021-08-17 ENCOUNTER — Ambulatory Visit (INDEPENDENT_AMBULATORY_CARE_PROVIDER_SITE_OTHER): Payer: Medicaid Other | Admitting: Surgical

## 2021-08-17 DIAGNOSIS — S81002S Unspecified open wound, left knee, sequela: Secondary | ICD-10-CM | POA: Diagnosis not present

## 2021-08-17 NOTE — Progress Notes (Signed)
? ?Wound Visit Note ?  ?Patient: Meagan Oneill           ?Date of Birth: [birth year]           ?MRN: [id number] ?Visit Date: 08/17/2021 ?PCP: Patient, No Pcp Per (Inactive) ? ? ?Assessment & Plan: ? ?Chief Complaint: No chief complaint on file. ? ?Visit Diagnoses:  ?1. Open knee wound, left, sequela   ? ? ?Plan: Patient returns for recheck of knee incision wound.  She has small pinpoint hole in lateral incision that had drainage expressed from it at last visit on Monday.  This wound was packed but the packing material came out when she got home after her visit on Monday due to the Ace bandage following down her leg.  She denies any fevers or chills.  No increase in her pain around the incision.  Cultures have not resulted yet.  On exam, there is no evidence of active drainage.  No expressible drainage.  No surrounding erythema.  Patient is comfortable and does not appear toxic.  Plan is, repacking of the knee wound.  This was successfully repacked with iodoform gauze and covered with gauze dressing with tape applied to hold it in place.  She will changes daily.  Follow-up on Monday for clinical recheck for new examination of the wound. ? ?Follow-Up Instructions: No follow-ups on file.  ? ?Orders:  ?No orders of the defined types were placed in this encounter. ? ?No orders of the defined types were placed in this encounter. ? ? ?Imaging: ?No results found. ? ?PMFS History: ?Patient Active Problem List  ? Diagnosis Date Noted  ? Dehiscence of incision, sequela   ? Open knee wound, left, sequela   ? Knee dislocation 04/05/2021  ? Severe obesity (BMI >= 40) ([facility]) 04/05/2021  ? Rupture of posterior cruciate ligament of left knee   ? Left anterior cruciate ligament tear   ? Acute lateral meniscus tear of left knee   ? Injury of posterolateral corner of knee, left, subsequent encounter   ? 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 S/P left knee surgery 03/25/2021  ? ?Past Medical History:  ?Diagnosis Date   ? Anemia   ? Asthma   ? no inhaler  ? Environmental allergies   ? Hypertension   ?  ?Family History  ?Problem Relation Age of Onset  ? Hypertension Mother   ? Diabetes Mother   ? Hypertension Sister   ?  ?Past Surgical History:  ?Procedure Laterality Date  ? I & D EXTREMITY Left 05/13/2021  ? Procedure: DEBRIDEMENT LEFT KNEE;  Surgeon: Nadara Mustard, MD;  Location[facility];  Service: Orthopedics;  Laterality: Left;  ? KNEE ARTHROSCOPY WITH ANTERIOR CRUCIATE LIGAMENT (ACL) REPAIR WITH HAMSTRING GRAFT Left 03/25/2021  ? Procedure: left knee anterior cruciate ligament, posterior cruciate ligment, posterolateral corner reconstruction arthroscopy; allograft ligaments for reconstruction, meniscal debridement vs repair;  Surgeon: Cammy Copa, MD;  Location: [facility];  Service: Orthopedics;  Laterality: Left;  ? WISDOM TOOTH EXTRACTION    ? ?Social History  ? ?Occupational History  ? Not on file  ?Tobacco Use  ? Smoking status: Never  ? Smokeless tobacco: Never  ?Vaping Use  ? Vaping Use: Never used  ?Substance and Sexual Activity  ? Alcohol use: Not Currently  ?  Alcohol/week: 1.0 standard drink  ?  Types: 1 Glasses of wine per week  ?  Comment: occas.  ? Drug use: No  ? Sexual activity:  Yes  ?  Birth control/protection: None  ? ? ? ?

## 2021-08-18 ENCOUNTER — Ambulatory Visit (HOSPITAL_COMMUNITY): Payer: Self-pay | Admitting: Physical Therapy

## 2021-08-18 DIAGNOSIS — Z30430 Encounter for insertion of intrauterine contraceptive device: Secondary | ICD-10-CM | POA: Diagnosis not present

## 2021-08-21 LAB — ANAEROBIC AND AEROBIC CULTURE
AER RESULT:: NO GROWTH
MICRO NUMBER:: 13364697
MICRO NUMBER:: 13364698
SPECIMEN QUALITY:: ADEQUATE
SPECIMEN QUALITY:: ADEQUATE

## 2021-08-22 ENCOUNTER — Ambulatory Visit (INDEPENDENT_AMBULATORY_CARE_PROVIDER_SITE_OTHER): Payer: Medicaid Other | Admitting: Surgical

## 2021-08-22 ENCOUNTER — Telehealth: Payer: Self-pay | Admitting: Orthopedic Surgery

## 2021-08-22 DIAGNOSIS — S81002S Unspecified open wound, left knee, sequela: Secondary | ICD-10-CM | POA: Diagnosis not present

## 2021-08-22 NOTE — Telephone Encounter (Signed)
Patient states she needs appt later in evening preferably 1 says Meagan Oneill usually makes space for her being its a follow up, she is booked for 10:45 being that was only appt left. She states if appt can be rescheduled to later in the afternoon just do it and she will see it via MyChart ?

## 2021-08-23 ENCOUNTER — Encounter: Payer: Self-pay | Admitting: Orthopedic Surgery

## 2021-08-23 NOTE — Progress Notes (Signed)
Wound Visit Note   Patient: Meagan Oneill           Date of Birth: 09/10/1990           MRN: [id number] Visit Date: 08/22/2021 PCP: Patient, No Pcp Per (Inactive)   Assessment & Plan:  Chief Complaint:  Chief Complaint  Patient presents with   Left Knee - Routine Post Op, Follow-up, Wound Check   Visit Diagnoses:  No diagnosis found.   Plan:  Last visit: Patient returns for recheck of knee incision wound.  She has small pinpoint hole in lateral incision that had drainage expressed from it at last visit on Monday.  This wound was packed but the packing material came out when she got home after her visit on Monday due to the Ace bandage following down her leg.  She denies any fevers or chills.  No increase in her pain around the incision.  Cultures have not resulted yet.  On exam, there is no evidence of active drainage.  No expressible drainage.  No surrounding erythema.  Patient is comfortable and does not appear toxic.  Plan is, repacking of the knee wound.  This was successfully repacked with iodoform gauze and covered with gauze dressing with tape applied to hold it in place.  She will changes daily.  Follow-up on Monday for clinical recheck for new examination of the wound.  Today's visit (08/23/2021): Patient returns.  Packing material removed no expressible drainage.  Patient continues to deny any fevers or chills.  Looks to be healing well without difficulty.  Probes less deep compared with prior exam.  No surrounding erythema.  Plan to repack today and return on Wednesday for reevaluation and repacking of the wound with likely subsequent return visit on Friday.  Follow-Up Instructions: No follow-ups on file.   Orders:  No orders of the defined types were placed in this encounter.  No orders of the defined types were placed in this encounter.   Imaging: No results found.  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    S/P left knee surgery 03/25/2021   Past Medical History:  Diagnosis Date   Anemia    Asthma    no inhaler   Environmental allergies    Hypertension     Family History  Problem Relation Age of Onset   Hypertension Mother    Diabetes Mother    Hypertension Sister     Past Surgical History:  Procedure Laterality Date   I & D EXTREMITY Left 05/13/2021   Procedure: DEBRIDEMENT LEFT KNEE;  Surgeon: Nadara Mustard, MD;  Location: [facility] OR;  Service: Orthopedics;  Laterality: Left;   KNEE ARTHROSCOPY WITH ANTERIOR CRUCIATE LIGAMENT (ACL) REPAIR WITH HAMSTRING GRAFT Left 03/25/2021   Procedure: left knee anterior cruciate ligament, posterior cruciate ligment, posterolateral corner reconstruction arthroscopy; allograft ligaments for reconstruction, meniscal debridement vs repair;  Surgeon: Cammy Copa, MD;  Location: [facility] OR;  Service: Orthopedics;  Laterality: Left;   WISDOM TOOTH EXTRACTION  Social History   Occupational History   Not on file  Tobacco Use   Smoking status: Never   Smokeless tobacco: Never  Vaping Use   Vaping Use: Never used  Substance and Sexual Activity   Alcohol use: Not Currently    Alcohol/week: 1.0 standard drink    Types: 1 Glasses of wine per week    Comment: occas.   Drug use: No   Sexual activity: Yes    Birth control/protection: None

## 2021-08-24 ENCOUNTER — Ambulatory Visit (INDEPENDENT_AMBULATORY_CARE_PROVIDER_SITE_OTHER): Payer: Medicaid Other | Admitting: Orthopedic Surgery

## 2021-08-24 ENCOUNTER — Encounter: Payer: Self-pay | Admitting: Orthopedic Surgery

## 2021-08-24 DIAGNOSIS — S81002S Unspecified open wound, left knee, sequela: Secondary | ICD-10-CM | POA: Diagnosis not present

## 2021-08-24 NOTE — Progress Notes (Signed)
   Wound Visit Note   Patient: Meagan Oneill           Date of Birth: 09-22-1990           MRN: [id number] Visit Date: 08/24/2021 PCP: Patient, No Pcp Per (Inactive)   Assessment & Plan:  Chief Complaint:  Chief Complaint  Patient presents with   Left Knee - Follow-up, Wound Check   Visit Diagnoses:  No diagnosis found.   Plan:   Last visit (08/22/2021): Patient returns.  Packing material removed no expressible drainage.  Patient continues to deny any fevers or chills.  Looks to be healing well without difficulty.  Probes less deep compared with prior exam.  No surrounding erythema.  Plan to repack today and return on Wednesday for reevaluation and repacking of the wound with likely subsequent return visit on Friday.  Today's visit (08/24/2021): Packing material removed with less drainage on the dressing then prior visit.  Trace expressible drainage that was serosanguineous.  No fevers or chills.  Continues to probe less deep but still probes several millimeters.  No surrounding erythema.  Repacked today and reevaluate on Friday by Dr. Marlou Sa.  Follow-Up Instructions: No follow-ups on file.   Orders:  No orders of the defined types were placed in this encounter.  No orders of the defined types were placed in this encounter.   Imaging: No results found.  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    S/P left knee surgery 03/25/2021   Past Medical History:  Diagnosis Date   Anemia    Asthma    no inhaler   Environmental allergies     Hypertension     Family History  Problem Relation Age of Onset   Hypertension Mother    Diabetes Mother    Hypertension Sister     Past Surgical History:  Procedure Laterality Date   I & D EXTREMITY Left 05/13/2021   Procedure: DEBRIDEMENT LEFT KNEE;  Surgeon: Newt Minion, MD;  Location: [place];  Service: Orthopedics;  Laterality: Left;   KNEE ARTHROSCOPY WITH ANTERIOR CRUCIATE LIGAMENT (ACL) REPAIR WITH HAMSTRING GRAFT Left 03/25/2021   Procedure: left knee anterior cruciate ligament, posterior cruciate ligment, posterolateral corner reconstruction arthroscopy; allograft ligaments for reconstruction, meniscal debridement vs repair;  Surgeon: Meredith Pel, MD;  Location: [place];  Service: Orthopedics;  Laterality: Left;   WISDOM TOOTH EXTRACTION     Social History   Occupational History   Not on file  Tobacco Use   Smoking status: Never   Smokeless tobacco: Never  Vaping Use   Vaping Use: Never used  Substance and Sexual Activity   Alcohol use: Not Currently    Alcohol/week: 1.0 standard drink    Types: 1 Glasses of wine per week    Comment: occas.   Drug use: No   Sexual activity: Yes    Birth control/protection: None

## 2021-08-26 ENCOUNTER — Encounter: Payer: Self-pay | Admitting: Orthopedic Surgery

## 2021-08-26 ENCOUNTER — Ambulatory Visit (INDEPENDENT_AMBULATORY_CARE_PROVIDER_SITE_OTHER): Payer: Worker's Compensation | Admitting: Orthopedic Surgery

## 2021-08-26 ENCOUNTER — Telehealth: Payer: Self-pay | Admitting: Orthopedic Surgery

## 2021-08-26 DIAGNOSIS — S81002S Unspecified open wound, left knee, sequela: Secondary | ICD-10-CM

## 2021-08-26 NOTE — Telephone Encounter (Signed)
Per Dr dean pt need an appt for Monday. Pt states 10 am is good for her. Pt phone number is [phone].

## 2021-08-27 NOTE — Progress Notes (Signed)
Post-Op Visit Note   Patient: Meagan Oneill           Date of Birth: [birth year]           MRN: [id number] Visit Date: 08/26/2021 PCP: Patient, No Pcp Per (Inactive)   Assessment & Plan:  Chief Complaint:  Chief Complaint  Patient presents with   Left Leg - Wound Check   Visit Diagnoses:  1. Open knee wound, left, sequela     Plan: See below  Follow-Up Instructions: No follow-ups on file.   Orders:  No orders of the defined types were placed in this encounter.  No orders of the defined types were placed in this encounter.   Imaging: No results found.  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    S/P left knee surgery 03/25/2021   Past Medical History:  Diagnosis Date   Anemia    Asthma    no inhaler   Environmental allergies    Hypertension     Family History  Problem Relation Age of Onset   Hypertension Mother    Diabetes Mother    Hypertension Sister     Past Surgical History:  Procedure Laterality Date   I & D EXTREMITY Left 05/13/2021   Procedure: DEBRIDEMENT LEFT KNEE;  Surgeon: Nadara Mustard, MD;  Location: [facility] OR;  Service: Orthopedics;  Laterality: Left;   KNEE ARTHROSCOPY WITH ANTERIOR CRUCIATE LIGAMENT (ACL) REPAIR WITH HAMSTRING GRAFT Left 03/25/2021   Procedure: left knee anterior cruciate ligament, posterior cruciate ligment, posterolateral corner reconstruction arthroscopy; allograft ligaments for reconstruction, meniscal debridement vs repair;  Surgeon: Cammy Copa, MD;  Location: [facility] OR;  Service:  Orthopedics;  Laterality: Left;   WISDOM TOOTH EXTRACTION     Social History   Occupational History   Not on file  Tobacco Use   Smoking status: Never   Smokeless tobacco: Never  Vaping Use   Vaping Use: Never used  Substance and Sexual Activity   Alcohol use: Not Currently    Alcohol/week: 1.0 standard drink    Types: 1 Glasses of wine per week    Comment: occas.   Drug use: No   Sexual activity: Yes    Birth control/protection: None      Post-Op Visit Note   Patient: Meagan Oneill           Date of Birth: [birth year]           MRN: [id number] Visit Date: 08/26/2021 PCP: Patient, No Pcp Per (Inactive)   Assessment & Plan:  Chief Complaint:  Chief Complaint  Patient presents with   Left Leg - Wound Check   Visit Diagnoses:  1. Open knee wound, left, sequela     Plan: Meagan is a 31 year old patient here for left leg wound recheck.  Packing removed.  Overall this wound is getting smaller.  Nothing ever grew out of culture.  Wound is repacked today.  We will change that out likely for final time on Monday.  No surrounding  erythema warmth around that lateral incision.  Follow-Up Instructions: No follow-ups on file.   Orders:  No orders of the defined types were placed in this encounter.  No orders of the defined types were placed in this encounter.   Imaging: No results found.  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    S/P left knee surgery 03/25/2021   Past Medical History:  Diagnosis Date   Anemia    Asthma    no  inhaler   Environmental allergies    Hypertension     Family History  Problem Relation Age of Onset   Hypertension Mother    Diabetes Mother    Hypertension Sister     Past Surgical History:  Procedure Laterality Date   I & D EXTREMITY Left 05/13/2021   Procedure: DEBRIDEMENT LEFT KNEE;  Surgeon: Nadara Mustard, MD;  Location: [facility] OR;  Service: Orthopedics;  Laterality: Left;   KNEE ARTHROSCOPY WITH ANTERIOR CRUCIATE LIGAMENT (ACL) REPAIR WITH HAMSTRING GRAFT Left 03/25/2021   Procedure: left knee anterior cruciate ligament, posterior cruciate ligment, posterolateral corner reconstruction arthroscopy; allograft ligaments for reconstruction, meniscal debridement vs repair;  Surgeon: Cammy Copa, MD;  Location: [facility] OR;  Service: Orthopedics;  Laterality: Left;   WISDOM TOOTH EXTRACTION     Social History   Occupational History   Not on file  Tobacco Use   Smoking status: Never   Smokeless tobacco: Never  Vaping Use   Vaping Use: Never used  Substance and Sexual Activity   Alcohol use: Not Currently    Alcohol/week: 1.0 standard drink    Types: 1 Glasses of wine per week    Comment: occas.   Drug use: No   Sexual activity: Yes    Birth control/protection: None

## 2021-08-30 NOTE — Telephone Encounter (Signed)
noted 

## 2021-08-30 NOTE — Telephone Encounter (Signed)
I think she has appointment on Wednesday, she can just keep that appt

## 2021-08-30 NOTE — Telephone Encounter (Signed)
Could either of you advise? Message was in Lauren's box and patient has not been called. She did not come in yesterday. Would you like her worked in on Wednesday or ok for nurse visit today if she can make it?

## 2021-08-31 ENCOUNTER — Ambulatory Visit (INDEPENDENT_AMBULATORY_CARE_PROVIDER_SITE_OTHER): Payer: No Typology Code available for payment source | Admitting: Surgical

## 2021-08-31 ENCOUNTER — Encounter: Payer: Self-pay | Admitting: Orthopedic Surgery

## 2021-08-31 DIAGNOSIS — S81002S Unspecified open wound, left knee, sequela: Secondary | ICD-10-CM

## 2021-08-31 NOTE — Progress Notes (Signed)
Post-Op Visit Note   Patient: Meagan Oneill           Date of Birth: 1990-08-15           MRN: [id number] Visit Date: 08/31/2021 PCP: Patient, No Pcp Per (Inactive)   Assessment & Plan:  Chief Complaint:  Chief Complaint  Patient presents with   Left Knee - Wound Check   Visit Diagnoses:  1. Open knee wound, left, sequela      Plan: Meagan is a 31 year old patient here for left leg wound recheck.  She last saw Dr. August Saucer several days ago.  Had wound repacked.  Packing material fell out last night.  It looks to be closing up well.  There is a very small pinpoint opening in the middle of the area in question.  There is no surrounding erythema or induration.  There is a small amount of expressible serous drainage.  Patient continues to deny any fevers or chills.  There is no pain associated with expressing the fluid.  The wound was probed but the probe was not able to be inserted due to the small hole.  Plan is to stop packing the wound at this point and follow-up in 1 week for clinical recheck to see if there is any drainage that is continued to be expressed from this area.  Patient agreed with plan.  Follow-Up Instructions: No follow-ups on file.   Orders:  No orders of the defined types were placed in this encounter.  No orders of the defined types were placed in this encounter.   Imaging: No results found.  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    S/P left knee surgery 03/25/2021   Past Medical History:  Diagnosis Date   Anemia     Asthma    no inhaler   Environmental allergies    Hypertension     Family History  Problem Relation Age of Onset   Hypertension Mother    Diabetes Mother    Hypertension Sister     Past Surgical History:  Procedure Laterality Date   I & D EXTREMITY Left 05/13/2021   Procedure: DEBRIDEMENT LEFT KNEE;  Surgeon: Nadara Mustard, MD;  Location: [facility] OR;  Service: Orthopedics;  Laterality: Left;   KNEE ARTHROSCOPY WITH ANTERIOR CRUCIATE LIGAMENT (ACL) REPAIR WITH HAMSTRING GRAFT Left 03/25/2021   Procedure: left knee anterior cruciate ligament, posterior cruciate ligment, posterolateral corner reconstruction arthroscopy; allograft ligaments for reconstruction, meniscal debridement vs repair;  Surgeon: Cammy Copa, MD;  Location: [facility] OR;  Service: Orthopedics;  Laterality: Left;   WISDOM TOOTH EXTRACTION     Social History   Occupational History   Not on file  Tobacco Use   Smoking status: Never   Smokeless tobacco: Never  Vaping Use   Vaping Use: Never used  Substance and Sexual Activity   Alcohol use: Not Currently    Alcohol/week: 1.0 standard drink    Types: 1 Glasses of wine per week    Comment:  occas.   Drug use: No   Sexual activity: Yes    Birth control/protection: None

## 2021-08-31 NOTE — Telephone Encounter (Signed)
Patient needs a one week follow up nothing available time frame preferably afternoon for scheduling

## 2021-08-31 NOTE — Telephone Encounter (Signed)
yeah

## 2021-08-31 NOTE — Telephone Encounter (Signed)
Ok to work her in with you next week?

## 2021-09-01 NOTE — Telephone Encounter (Signed)
Lvm for pt to cb to schedule next week with luke

## 2021-09-08 ENCOUNTER — Telehealth: Payer: Self-pay

## 2021-09-08 ENCOUNTER — Other Ambulatory Visit: Payer: Self-pay | Admitting: Pediatrics

## 2021-09-08 ENCOUNTER — Other Ambulatory Visit (HOSPITAL_COMMUNITY): Payer: Self-pay | Admitting: Nurse Practitioner

## 2021-09-08 ENCOUNTER — Other Ambulatory Visit: Payer: Self-pay | Admitting: Nurse Practitioner

## 2021-09-08 DIAGNOSIS — N949 Unspecified condition associated with female genital organs and menstrual cycle: Secondary | ICD-10-CM | POA: Diagnosis not present

## 2021-09-08 DIAGNOSIS — N920 Excessive and frequent menstruation with regular cycle: Secondary | ICD-10-CM | POA: Diagnosis not present

## 2021-09-08 DIAGNOSIS — A599 Trichomoniasis, unspecified: Secondary | ICD-10-CM | POA: Diagnosis not present

## 2021-09-08 DIAGNOSIS — Z30431 Encounter for routine checking of intrauterine contraceptive device: Secondary | ICD-10-CM | POA: Diagnosis not present

## 2021-09-08 NOTE — Telephone Encounter (Signed)
Followed up with patient per her request on 5.31.23 to locate an additional food pantry to access more meats and other foods.  S  Plan  -pt was provided information and referral to [employer] [facility] food [facility] that would held on today from hours of 9a-12pm (All documents needed to access food were provided.

## 2021-09-09 ENCOUNTER — Other Ambulatory Visit (HOSPITAL_COMMUNITY): Payer: Self-pay | Admitting: Nurse Practitioner

## 2021-09-09 ENCOUNTER — Other Ambulatory Visit: Payer: Self-pay | Admitting: Nurse Practitioner

## 2021-09-09 DIAGNOSIS — Z30431 Encounter for routine checking of intrauterine contraceptive device: Secondary | ICD-10-CM

## 2021-09-15 ENCOUNTER — Encounter (HOSPITAL_COMMUNITY): Payer: Self-pay

## 2021-09-15 ENCOUNTER — Ambulatory Visit (HOSPITAL_COMMUNITY): Admission: RE | Admit: 2021-09-15 | Payer: Medicaid Other | Source: Ambulatory Visit

## 2021-09-22 DIAGNOSIS — Z113 Encounter for screening for infections with a predominantly sexual mode of transmission: Secondary | ICD-10-CM | POA: Diagnosis not present

## 2021-09-22 DIAGNOSIS — N760 Acute vaginitis: Secondary | ICD-10-CM | POA: Diagnosis not present

## 2021-09-22 DIAGNOSIS — Z30431 Encounter for routine checking of intrauterine contraceptive device: Secondary | ICD-10-CM | POA: Diagnosis not present

## 2021-10-13 DIAGNOSIS — Z30431 Encounter for routine checking of intrauterine contraceptive device: Secondary | ICD-10-CM | POA: Diagnosis not present

## 2021-10-15 ENCOUNTER — Other Ambulatory Visit: Payer: Self-pay

## 2021-10-15 ENCOUNTER — Encounter (HOSPITAL_COMMUNITY): Payer: Self-pay | Admitting: *Deleted

## 2021-10-15 ENCOUNTER — Emergency Department (HOSPITAL_COMMUNITY): Payer: Self-pay

## 2021-10-15 ENCOUNTER — Emergency Department (HOSPITAL_COMMUNITY)
Admission: EM | Admit: 2021-10-15 | Discharge: 2021-10-15 | Disposition: A | Discharge status: Home / Self Care | Payer: Self-pay | Attending: Emergency Medicine | Admitting: Emergency Medicine

## 2021-10-15 ENCOUNTER — Encounter (HOSPITAL_COMMUNITY): Payer: Self-pay

## 2021-10-15 DIAGNOSIS — Z7901 Long term (current) use of anticoagulants: Secondary | ICD-10-CM | POA: Insufficient documentation

## 2021-10-15 DIAGNOSIS — N83202 Unspecified ovarian cyst, left side: Secondary | ICD-10-CM | POA: Insufficient documentation

## 2021-10-15 DIAGNOSIS — N281 Cyst of kidney, acquired: Secondary | ICD-10-CM | POA: Insufficient documentation

## 2021-10-15 DIAGNOSIS — N9489 Other specified conditions associated with female genital organs and menstrual cycle: Secondary | ICD-10-CM | POA: Insufficient documentation

## 2021-10-15 DIAGNOSIS — R1030 Lower abdominal pain, unspecified: Secondary | ICD-10-CM

## 2021-10-15 LAB — CBC WITH DIFFERENTIAL/PLATELET
Abs Immature Granulocytes: 0.05 10*3/uL (ref 0.00–0.07)
Basophils Absolute: 0.1 10*3/uL (ref 0.0–0.1)
Basophils Relative: 1 %
Eosinophils Absolute: 0.2 10*3/uL (ref 0.0–0.5)
Eosinophils Relative: 2 %
HCT: 35.5 % — ABNORMAL LOW (ref 36.0–46.0)
Hemoglobin: 10.7 g/dL — ABNORMAL LOW (ref 12.0–15.0)
Immature Granulocytes: 1 %
Lymphocytes Relative: 30 %
Lymphs Abs: 3.3 10*3/uL (ref 0.7–4.0)
MCH: 20.3 pg — ABNORMAL LOW (ref 26.0–34.0)
MCHC: 30.1 g/dL (ref 30.0–36.0)
MCV: 67.2 fL — ABNORMAL LOW (ref 80.0–100.0)
Monocytes Absolute: 0.6 10*3/uL (ref 0.1–1.0)
Monocytes Relative: 6 %
Neutro Abs: 6.6 10*3/uL (ref 1.7–7.7)
Neutrophils Relative %: 60 %
Platelets: 231 10*3/uL (ref 150–400)
RBC: 5.28 MIL/uL — ABNORMAL HIGH (ref 3.87–5.11)
RDW: 21.3 % — ABNORMAL HIGH (ref 11.5–15.5)
WBC: 10.8 10*3/uL — ABNORMAL HIGH (ref 4.0–10.5)
nRBC: 0 % (ref 0.0–0.2)

## 2021-10-15 LAB — URINALYSIS, ROUTINE W REFLEX MICROSCOPIC
Bilirubin Urine: NEGATIVE
Glucose, UA: NEGATIVE mg/dL
Ketones, ur: NEGATIVE mg/dL
Leukocytes,Ua: NEGATIVE
Nitrite: NEGATIVE
Protein, ur: NEGATIVE mg/dL
Specific Gravity, Urine: 1.045 — ABNORMAL HIGH (ref 1.005–1.030)
pH: 5 (ref 5.0–8.0)

## 2021-10-15 LAB — COMPREHENSIVE METABOLIC PANEL
ALT: 14 U/L (ref 0–44)
AST: 15 U/L (ref 15–41)
Albumin: 3.4 g/dL — ABNORMAL LOW (ref 3.5–5.0)
Alkaline Phosphatase: 62 U/L (ref 38–126)
Anion gap: 6 (ref 5–15)
BUN: 7 mg/dL (ref 6–20)
CO2: 26 mmol/L (ref 22–32)
Calcium: 8.9 mg/dL (ref 8.9–10.3)
Chloride: 108 mmol/L (ref 98–111)
Creatinine, Ser: 0.69 mg/dL (ref 0.44–1.00)
GFR, Estimated: >60 mL/min (ref >60)
Glucose, Bld: 121 mg/dL — ABNORMAL HIGH (ref 70–99)
Potassium: 3.5 mmol/L (ref 3.5–5.1)
Sodium: 140 mmol/L (ref 135–145)
Total Bilirubin: 0.7 mg/dL (ref 0.3–1.2)
Total Protein: 7.7 g/dL (ref 6.5–8.1)

## 2021-10-15 LAB — LIPASE, BLOOD: Lipase: 29 U/L (ref 11–51)

## 2021-10-15 LAB — I-STAT BETA HCG BLOOD, ED (MC, WL, AP ONLY): I-stat hCG, quantitative: <5 m[IU]/mL (ref <5)

## 2021-10-15 MED ORDER — IOHEXOL 300 MG/ML  SOLN
100.0000 mL | Freq: Once | INTRAMUSCULAR | Status: AC | PRN
  Administered 2021-10-15: 100 mL via INTRAVENOUS

## 2021-10-15 MED ORDER — HYDROMORPHONE HCL 1 MG/ML IJ SOLN
1.0000 mg | Freq: Once | INTRAMUSCULAR | Status: AC
Start: 2021-10-15 — End: 2021-10-15
  Administered 2021-10-15: 1 mg via INTRAVENOUS
  Filled 2021-10-15: qty 1

## 2021-10-15 MED ORDER — ONDANSETRON HCL 4 MG/2ML IJ SOLN
4.0000 mg | Freq: Once | INTRAMUSCULAR | Status: AC
Start: 2021-10-15 — End: 2021-10-15
  Administered 2021-10-15: 4 mg via INTRAVENOUS
  Filled 2021-10-15: qty 2

## 2021-10-15 MED ORDER — ETODOLAC 300 MG PO CAPS: 300.0000 mg | ORAL_CAPSULE | Freq: Three times a day (TID) | ORAL | 0 refills | Status: DC

## 2021-10-15 MED ORDER — HYDROMORPHONE HCL 1 MG/ML IJ SOLN
1.0000 mg | Freq: Once | INTRAMUSCULAR | Status: AC
  Administered 2021-10-15: 1 mg via INTRAVENOUS
  Filled 2021-10-15: qty 1

## 2021-10-15 MED ORDER — KETOROLAC TROMETHAMINE 30 MG/ML IJ SOLN
15.0000 mg | Freq: Once | INTRAMUSCULAR | Status: AC
  Administered 2021-10-15: 15 mg via INTRAVENOUS
  Filled 2021-10-15: qty 1

## 2021-10-15 NOTE — ED Notes (Signed)
Pt tried to use the restroom but unable to urinate at this time.

## 2021-10-15 NOTE — ED Notes (Signed)
Reminded pt we needed a urine sample 

## 2021-10-15 NOTE — ED Provider Notes (Addendum)
Patient initially seen by Dr. Oletta Cohn.  Please see his note.  Patient's CT scan showed an abnormal cystic lesion in the pelvis.  Ultrasound was performed and it shows a complex ovarian cyst.  Suspect patient may have had hemorrhage into an ovarian cyst.  Will need follow-up ultrasound which I discussed with the patient to rule out neoplasm.  Will discharge home with course of NSAIDs.  Outpatient follow-up with her OB/GYN  Clinical Course as of 10/15/21 1152  Sat Oct 15, 2021  0850 CT ABDOMEN PELVIS W CONTRAST CT scan findings reviewed.  Cystic lesion noted in the pelvic region.  We will proceed with ultrasound [JK]  1142 5.7 cm complex septated cystic lesion in the left adnexal space, likely ovarian etiology. Patient is symptomatic in the left lower quadrant. Hemorrhagic cyst would be a distinct consideration. Neoplasm considered less likely but not entirely excluded   [JK]    Clinical Course User Index [JK] Linwood Dibbles, MD      Linwood Dibbles, MD 10/15/21 1152    Linwood Dibbles, MD 10/15/21 1152

## 2021-10-15 NOTE — ED Notes (Signed)
Pt provided with bedside commode. Asked to use call bell when urine sample provided.

## 2021-10-15 NOTE — ED Notes (Signed)
Patient transported to CT 

## 2021-10-15 NOTE — Discharge Instructions (Signed)
Follow-up with your OB/GYN doctor to be rechecked.  The radiologist did recommend a follow-up ultrasound in approximately 6 weeks to make sure the cyst has resolved.  Take the medications as needed for pain and discomfort

## 2021-10-15 NOTE — ED Triage Notes (Signed)
Pt seen here earlier and dx'd with ovarian cyst and was discharged.  Pt states abd pain has increased.  + nausea.

## 2021-10-15 NOTE — ED Provider Notes (Signed)
[facility] EMERGENCY DEPARTMENT Provider Note   CSN: [id number] Arrival date & time: 10/15/21  0408     History  Chief Complaint  Patient presents with   Abdominal Pain    Meagan Oneill is a 31 y.o. female.  Patient presents to the emergency department for evaluation of abdominal and back pain.  Patient reports that she started having left-sided abdominal pain approximately 3 days ago.  Pain has radiated into the back.  Patient reports she is now experiencing diffuse lower abdominal pain on both sides with pain in the lower back.  When the pain started she thought it might be her IUD, saw her OB/GYN who told her that the IUD was in normal alignment and she might be constipated.  Patient does report feeling constipated.  No associated nausea, vomiting, diarrhea.  She has not had any fever.  Denies urinary symptoms.       Home Medications Prior to Admission medications   Medication Sig Start Date End Date Taking? Authorizing Provider  celecoxib (CELEBREX) 200 MG capsule Take 1 capsule (200 mg total) by mouth daily. 04/11/21   Love, Ivan Anchors, PA-C  methocarbamol (ROBAXIN) 500 MG tablet Take 1 tablet (500 mg total) by mouth every 6 (six) hours as needed for muscle spasms. 04/11/21   Love, Ivan Anchors, PA-C  metoprolol tartrate (LOPRESSOR) 25 MG tablet Take 1 tablet (25 mg total) by mouth 2 (two) times daily. 04/11/21   Love, Ivan Anchors, PA-C  oxyCODONE-acetaminophen (PERCOCET/ROXICET) 5-325 MG tablet Take 1 tablet by mouth every 4 (four) hours as needed. 05/13/21   Newt Minion, MD  rivaroxaban (XARELTO) 10 MG TABS tablet Take 1 tablet (10 mg total) by mouth daily. 04/11/21   Love, Ivan Anchors, PA-C  ferrous sulfate 325 (65 FE) MG tablet Take 1 tablet (325 mg total) by mouth daily for 14 days. 10/21/18 09/11/19  Couture, Cortni S, PA-C      Allergies    Morphine and related    Review of Systems   Review of Systems  Physical Exam Updated Vital Signs BP 124/83   Pulse 100   Resp 17   Ht 5\' 4"   (1.626 m)   Wt (!) 171.5 kg   LMP 10/08/2021 (Approximate)   SpO2 100%   BMI 64.88 kg/m  Physical Exam Vitals and nursing note reviewed.  Constitutional:      General: She is not in acute distress.    Appearance: She is well-developed. She is obese.  HENT:     Head: Normocephalic and atraumatic.     Mouth/Throat:     Mouth: Mucous membranes are moist.  Eyes:     General: Vision grossly intact. Gaze aligned appropriately.     Extraocular Movements: Extraocular movements intact.     Conjunctiva/sclera: Conjunctivae normal.  Cardiovascular:     Rate and Rhythm: Normal rate and regular rhythm.     Pulses: Normal pulses.     Oneill sounds: Normal Oneill sounds, S1 normal and S2 normal. No murmur heard.    No friction rub. No gallop.  Pulmonary:     Effort: Pulmonary effort is normal. No respiratory distress.     Breath sounds: Normal breath sounds.  Abdominal:     General: Bowel sounds are normal.     Palpations: Abdomen is soft.     Tenderness: There is no abdominal tenderness. There is no guarding or rebound.     Hernia: No hernia is present.  Musculoskeletal:  General: No swelling.     Cervical back: Full passive range of motion without pain, normal range of motion and neck supple. No spinous process tenderness or muscular tenderness. Normal range of motion.     Right lower leg: No edema.     Left lower leg: No edema.  Skin:    General: Skin is warm and dry.     Capillary Refill: Capillary refill takes less than 2 seconds.     Findings: No ecchymosis, erythema, rash or wound.  Neurological:     General: No focal deficit present.     Mental Status: She is alert and oriented to person, place, and time.     GCS: GCS eye subscore is 4. GCS verbal subscore is 5. GCS motor subscore is 6.     Cranial Nerves: Cranial nerves 2-12 are intact.     Sensory: Sensation is intact.     Motor: Motor function is intact.     Coordination: Coordination is intact.  Psychiatric:         Attention and Perception: Attention normal.        Mood and Affect: Mood normal.        Speech: Speech normal.        Behavior: Behavior normal.     ED Results / Procedures / Treatments   Labs (all labs ordered are listed, but only abnormal results are displayed) Labs Reviewed  CBC WITH DIFFERENTIAL/PLATELET - Abnormal; Notable for the following components:      Result Value   WBC 10.8 (*)    RBC 5.28 (*)    Hemoglobin 10.7 (*)    HCT 35.5 (*)    MCV 67.2 (*)    MCH 20.3 (*)    RDW 21.3 (*)    All other components within normal limits  COMPREHENSIVE METABOLIC PANEL - Abnormal; Notable for the following components:   Glucose, Bld 121 (*)    Albumin 3.4 (*)    All other components within normal limits  LIPASE, BLOOD  URINALYSIS, ROUTINE W REFLEX MICROSCOPIC  I-STAT BETA HCG BLOOD, ED ([facility], [facility], [facility] ONLY)    EKG None  Radiology No results found.  Procedures Procedures    Medications Ordered in ED Medications  HYDROmorphone (DILAUDID) injection 1 mg (1 mg Intravenous Given 10/15/21 0521)  ondansetron (ZOFRAN) injection 4 mg (4 mg Intravenous Given 10/15/21 0522)  HYDROmorphone (DILAUDID) injection 1 mg (1 mg Intravenous Given 10/15/21 0640)  iohexol (OMNIPAQUE) 300 MG/ML solution 100 mL (100 mLs Intravenous Contrast Given 10/15/21 0645)    ED Course/ Medical Decision Making/ A&P                           Medical Decision Making Amount and/or Complexity of Data Reviewed Labs: ordered. Radiology: ordered.  Risk Prescription drug management.   Patient presents to the emergency department for evaluation of abdominal and back pain.  Patient complaining of diffuse lower abdominal pain with pain across the back.  She does, however, reports that initially the pain was more in the left lower abdomen.  Differential diagnosis includes pancreatitis, diverticulitis, colitis, pyelonephritis, renal colic.  Abdominal exam is fairly benign.  She does not have significant abdominal  tenderness to coincide with the pain.  Blood work unremarkable.  Normal LFTs, normal creatinine, normal lipase.  Awaiting urinalysis.  Will perform CT scan to further evaluate.  Signout to oncoming ER physician to follow-up on results.  Provided analgesia during work-up.  Final Clinical Impression(s) / ED Diagnoses Final diagnoses:  Lower abdominal pain    Rx / DC Orders ED Discharge Orders     None         Gilda Crease, MD 10/15/21 [phone]

## 2021-10-15 NOTE — ED Triage Notes (Signed)
Pt arrived from home w c/o abdominal and back pain that started 3 days ago. Says pain has gotten worse over night and feels sharp/stabbing.

## 2021-10-15 NOTE — ED Notes (Signed)
Patient attempted to give urine sample at this time. Patient stated that she was unable to give urine sample at this time.

## 2021-10-15 NOTE — ED Notes (Signed)
Patient back from CT.

## 2021-10-16 ENCOUNTER — Emergency Department (HOSPITAL_COMMUNITY)
Admission: EM | Admit: 2021-10-16 | Discharge: 2021-10-16 | Disposition: A | Discharge status: Home / Self Care | Payer: Medicaid Other | Attending: Emergency Medicine | Admitting: Emergency Medicine

## 2021-10-16 DIAGNOSIS — N281 Cyst of kidney, acquired: Secondary | ICD-10-CM

## 2021-10-16 DIAGNOSIS — N83202 Unspecified ovarian cyst, left side: Secondary | ICD-10-CM

## 2021-10-16 LAB — URINALYSIS, ROUTINE W REFLEX MICROSCOPIC
Glucose, UA: NEGATIVE mg/dL
Ketones, ur: NEGATIVE mg/dL
Nitrite: NEGATIVE
Specific Gravity, Urine: 1.015 (ref 1.005–1.030)
pH: 6.5 (ref 5.0–8.0)

## 2021-10-16 LAB — URINALYSIS, MICROSCOPIC (REFLEX)

## 2021-10-16 LAB — HEMOGLOBIN AND HEMATOCRIT, BLOOD
HCT: 33.6 % — ABNORMAL LOW (ref 36.0–46.0)
Hemoglobin: 10.6 g/dL — ABNORMAL LOW (ref 12.0–15.0)

## 2021-10-16 MED ORDER — ONDANSETRON 8 MG PO TBDP
8.0000 mg | ORAL_TABLET | Freq: Once | ORAL | Status: AC
Start: 2021-10-16 — End: 2021-10-16
  Administered 2021-10-16: 8 mg via ORAL
  Filled 2021-10-16: qty 1

## 2021-10-16 MED ORDER — OXYCODONE-ACETAMINOPHEN 5-325 MG PO TABS
2.0000 | ORAL_TABLET | Freq: Once | ORAL | Status: AC
  Administered 2021-10-16: 2 via ORAL
  Filled 2021-10-16: qty 2

## 2021-10-16 MED ORDER — OXYCODONE-ACETAMINOPHEN 5-325 MG PO TABS: 1.0000 | ORAL_TABLET | Freq: Four times a day (QID) | ORAL | 0 refills | Status: DC | PRN

## 2021-10-16 MED ORDER — OXYCODONE-ACETAMINOPHEN 5-325 MG PO TABS: 1.0000 | ORAL_TABLET | ORAL | 0 refills | Status: DC | PRN

## 2021-10-16 MED ORDER — HYDROMORPHONE HCL 1 MG/ML IJ SOLN
1.0000 mg | Freq: Once | INTRAMUSCULAR | Status: AC
  Administered 2021-10-16: 1 mg via INTRAMUSCULAR
  Filled 2021-10-16: qty 1

## 2021-10-16 NOTE — ED Provider Notes (Signed)
[facility] EMERGENCY DEPARTMENT Provider Note   CSN: [id number] Arrival date & time: 10/15/21  2205     History  Chief Complaint  Patient presents with   Abdominal Pain    Meagan Oneill is a 31 y.o. female.  Patient presents to the emergency department for evaluation of persistent abdominal pain.  Patient was here overnight into this morning.  She was diagnosed with an ovarian cyst.  She reports that since she went home and started taking the etodolac, she is having pain all over her abdomen.  She reports that the etodolac did not control her pain so she started taking ibuprofen as well.  She has seen blood in her urine.       Home Medications Prior to Admission medications   Medication Sig Start Date End Date Taking? Authorizing Provider  oxyCODONE-acetaminophen (PERCOCET) 5-325 MG tablet Take 1-2 tablets by mouth every 4 (four) hours as needed. 10/16/21  Yes Himmat Enberg, Canary Brim, MD  oxyCODONE-acetaminophen (PERCOCET/ROXICET) 5-325 MG tablet Take 1 tablet by mouth every 6 (six) hours as needed for severe pain. 10/16/21  Yes Myrtis Maille, Canary Brim, MD  metoprolol tartrate (LOPRESSOR) 25 MG tablet Take 1 tablet (25 mg total) by mouth 2 (two) times daily. 04/11/21   Love, Evlyn Kanner, PA-C  rivaroxaban (XARELTO) 10 MG TABS tablet Take 1 tablet (10 mg total) by mouth daily. 04/11/21   Love, Evlyn Kanner, PA-C  ferrous sulfate 325 (65 FE) MG tablet Take 1 tablet (325 mg total) by mouth daily for 14 days. 10/21/18 09/11/19  Couture, Cortni S, PA-C      Allergies    Morphine and related    Review of Systems   Review of Systems  Physical Exam Updated Vital Signs BP 121/84 (BP Location: Left Wrist)   Pulse (!) 101   Temp 98.4 F (36.9 C) (Oral)   Resp 17   LMP 09/08/2021 (Approximate)   SpO2 95%  Physical Exam Vitals and nursing note reviewed.  Constitutional:      General: She is not in acute distress.    Appearance: She is well-developed.  HENT:     Head: Normocephalic and atraumatic.      Mouth/Throat:     Mouth: Mucous membranes are moist.  Eyes:     General: Vision grossly intact. Gaze aligned appropriately.     Extraocular Movements: Extraocular movements intact.     Conjunctiva/sclera: Conjunctivae normal.  Cardiovascular:     Rate and Rhythm: Normal rate and regular rhythm.     Pulses: Normal pulses.     Heart sounds: Normal heart sounds, S1 normal and S2 normal. No murmur heard.    No friction rub. No gallop.  Pulmonary:     Effort: Pulmonary effort is normal. No respiratory distress.     Breath sounds: Normal breath sounds.  Abdominal:     General: Bowel sounds are normal.     Palpations: Abdomen is soft.     Tenderness: There is generalized abdominal tenderness. There is no guarding or rebound.     Hernia: No hernia is present.  Musculoskeletal:        General: No swelling.     Cervical back: Full passive range of motion without pain, normal range of motion and neck supple. No spinous process tenderness or muscular tenderness. Normal range of motion.     Right lower leg: No edema.     Left lower leg: No edema.  Skin:    General: Skin is warm and dry.  Capillary Refill: Capillary refill takes less than 2 seconds.     Findings: No ecchymosis, erythema, rash or wound.  Neurological:     General: No focal deficit present.     Mental Status: She is alert and oriented to person, place, and time.     GCS: GCS eye subscore is 4. GCS verbal subscore is 5. GCS motor subscore is 6.     Cranial Nerves: Cranial nerves 2-12 are intact.     Sensory: Sensation is intact.     Motor: Motor function is intact.     Coordination: Coordination is intact.  Psychiatric:        Attention and Perception: Attention normal.        Mood and Affect: Mood normal.        Speech: Speech normal.        Behavior: Behavior normal.     ED Results / Procedures / Treatments   Labs (all labs ordered are listed, but only abnormal results are displayed) Labs Reviewed  HEMOGLOBIN  AND HEMATOCRIT, BLOOD - Abnormal; Notable for the following components:      Result Value   Hemoglobin 10.6 (*)    HCT 33.6 (*)    All other components within normal limits  URINALYSIS, ROUTINE W REFLEX MICROSCOPIC - Abnormal; Notable for the following components:   Hgb urine dipstick LARGE (*)    Bilirubin Urine LARGE (*)    Protein, ur TRACE (*)    Leukocytes,Ua TRACE (*)    All other components within normal limits  URINALYSIS, MICROSCOPIC (REFLEX) - Abnormal; Notable for the following components:   Bacteria, UA RARE (*)    All other components within normal limits    EKG None  Radiology US PELVIC COMPLETE W TRANSVAGINAL AND TORSION R/O  Result Date: 10/15/2021 CLINICAL DATA:  Pelvic pain. EXAM: TRANSABDOMINAL AND TRANSVAGINAL ULTRASOUND OF PELVIS DOPPLER ULTRASOUND OF OVARIES TECHNIQUE: Both transabdominal and transvaginal ultrasound examinations of the pelvis were performed. Transabdominal technique was performed for global imaging of the pelvis including uterus, ovaries, adnexal regions, and pelvic cul-de-sac. It was necessary to proceed with endovaginal exam following the transabdominal exam to visualize the left ovary. Color and duplex Doppler ultrasound was utilized to evaluate blood flow to the ovaries. COMPARISON:  None Available. FINDINGS: Uterus Measurements: 9.4 x 4.7 x 5.9 cm = volume: 137 mL. No fibroids or other mass visualized. Endometrium Thickness: 5 mm.  IUD visualized Right ovary Measurements: Not visualized. Left ovary Measurements: 6.1 x 4.5 x 5.2 cm = volume: 75 mL. Left ovary largely replaced by 5.7 x 3.7 x 4.1 cm heterogeneous septated cystic lesion. Pulsed Doppler evaluation of the left ovary demonstrates normal low-resistance arterial and venous waveforms. Other findings No abnormal free fluid. IMPRESSION: 5.7 cm complex septated cystic lesion in the left adnexal space, likely ovarian etiology. Patient is symptomatic in the left lower quadrant. Hemorrhagic cyst would  be a distinct consideration. Neoplasm considered less likely but not entirely excluded. Given that venous and low resistance arterial blood flow is detectable within the ovarian parenchyma, ovarian torsion is not considered likely. Correlate clinically and consider follow-up ultrasound in 6-12 weeks to ensure resolution. Electronically Signed   By: Kennith Center M.D.   On: 10/15/2021 11:16   CT ABDOMEN PELVIS W CONTRAST  Result Date: 10/15/2021 CLINICAL DATA:  Left lower quadrant pain EXAM: CT ABDOMEN AND PELVIS WITH CONTRAST TECHNIQUE: Multidetector CT imaging of the abdomen and pelvis was performed using the standard protocol following bolus administration of intravenous  contrast. RADIATION DOSE REDUCTION: This exam was performed according to the departmental dose-optimization program which includes automated exposure control, adjustment of the mA and/or kV according to patient size and/or use of iterative reconstruction technique. CONTRAST:  OMNIPAQUE IOHEXOL 300 MG/ML  SOLN COMPARISON:  11/01/2017 FINDINGS: Lower chest: No acute abnormality. Hepatobiliary: No focal liver abnormality is seen. No gallstones, gallbladder wall thickening, or biliary dilatation. Pancreas: Unremarkable. No pancreatic ductal dilatation or surrounding inflammatory changes. Spleen: Normal in size without focal abnormality. Adrenals/Urinary Tract: Normal adrenal glands. There is a lesion arising off the inferior pole of the left kidney which measures 2.2 x 2.0 cm and has internal Hounsfield units of 26. Peripheral nodular calcification identified along the inferior margin of this lesion. This is incompletely characterized on today's study. Right kidney appears normal. No nephrolithiasis or hydronephrosis. Urinary bladder is unremarkable. Stomach/Bowel: Stomach appears normal. The appendix is visualized and appears normal. No bowel wall thickening, inflammation, or distension. Vascular/Lymphatic: No significant vascular findings  are present. No enlarged abdominal or pelvic lymph nodes. Reproductive: IUD is identified within the uterus. Within the left side of pelvis, there is a is a septated cystic structure measuring 5.0 x 3.3 by 8.3 cm. This appears closely associated with the uterine corpus. I cannot tell if this is arising from the left ovary or within the wall of the left uterus. The right side of uterus and right adnexa are unremarkable. Other: There is no free fluid or fluid collections. No signs of pneumoperitoneum. Small fat containing umbilical hernia. Musculoskeletal: No acute or significant osseous findings. IMPRESSION: 1. No acute findings within the abdomen or pelvis. 2. There is a septated cystic structure within the left side of pelvis which appears closely associated with the left uterine corpus. I cannot tell if this is arising from the left ovary or within the wall of the left uterus. If uterine in origin this may represent a degenerating fibroid. Alternatively this may represent an ovarian cyst or hydrosalpinx. Further evaluation with pelvic sonogram is advised. 3. Indeterminate cystic lesion measuring 25 Hounsfield units arises within the inferior pole of left kidney. This although similar in size to previous imaging contains peripheral nodular calcification and is incompletely characterized on the current exam. When the patient is clinically stable, able to breath hold remain motionless, outpatient renal protocol MRI is advised. Electronically Signed   By: Signa Kell M.D.   On: 10/15/2021 07:53    Procedures Procedures    Medications Ordered in ED Medications  HYDROmorphone (DILAUDID) injection 1 mg (1 mg Intramuscular Given 10/16/21 0222)  ondansetron (ZOFRAN-ODT) disintegrating tablet 8 mg (8 mg Oral Given 10/16/21 0222)  oxyCODONE-acetaminophen (PERCOCET/ROXICET) 5-325 MG per tablet 2 tablet (2 tablets Oral Given 10/16/21 0353)    ED Course/ Medical Decision Making/ A&P                            Medical Decision Making Amount and/or Complexity of Data Reviewed Labs: ordered.  Risk Prescription drug management.   Patient presents with persistent abdominal pain.  She was seen overnight.  Patient underwent CT scan to further evaluate her left-sided pain and was diagnosed with ovarian cyst.  She then had pelvic ultrasound with Doppler that visualize the cyst, no evidence of torsion.  Patient with persistent pain.  She underwent repeat CBC and her hemoglobin is stable, therefore no concern for hemorrhage from the cyst.  She is experiencing more diffuse abdominal pain and this may be  secondary to taking etodolac and ibuprofen together.  Redirected her pain medications.  Stop the etodolac, take the ibuprofen and will add oxycodone until she can follow-up with her OB/GYN tomorrow.  There was an incidental calcified cyst seen on her kidney.  Will refer to urology for further evaluation as radiology has recommended renal MRI.        Final Clinical Impression(s) / ED Diagnoses Final diagnoses:  Cyst of left ovary  Renal cyst    Rx / DC Orders ED Discharge Orders          Ordered    Ambulatory referral to Urology       Comments: Complex renal cyst   10/16/21 0152    oxyCODONE-acetaminophen (PERCOCET/ROXICET) 5-325 MG tablet  Every 6 hours PRN        10/16/21 0330    oxyCODONE-acetaminophen (PERCOCET) 5-325 MG tablet  Every 4 hours PRN        10/16/21 0331              Gilda Crease, MD 10/16/21 [phone]

## 2021-10-17 MED FILL — Oxycodone w/ Acetaminophen Tab 5-325 MG: ORAL | Qty: 6 | Status: AC

## 2021-10-19 DIAGNOSIS — N949 Unspecified condition associated with female genital organs and menstrual cycle: Secondary | ICD-10-CM | POA: Diagnosis not present

## 2021-10-19 DIAGNOSIS — N83202 Unspecified ovarian cyst, left side: Secondary | ICD-10-CM | POA: Diagnosis not present

## 2021-10-19 DIAGNOSIS — Z30432 Encounter for removal of intrauterine contraceptive device: Secondary | ICD-10-CM | POA: Diagnosis not present

## 2021-10-24 ENCOUNTER — Other Ambulatory Visit (HOSPITAL_COMMUNITY): Payer: Medicaid Other

## 2021-12-02 ENCOUNTER — Ambulatory Visit
Admission: EM | Admit: 2021-12-02 | Discharge: 2021-12-02 | Disposition: A | Discharge status: Home / Self Care | Payer: Medicaid Other | Attending: Family Medicine | Admitting: Family Medicine

## 2021-12-02 DIAGNOSIS — R1032 Left lower quadrant pain: Secondary | ICD-10-CM | POA: Insufficient documentation

## 2021-12-02 DIAGNOSIS — Z8742 Personal history of other diseases of the female genital tract: Secondary | ICD-10-CM | POA: Insufficient documentation

## 2021-12-02 LAB — POCT URINALYSIS DIP (MANUAL ENTRY)
Bilirubin, UA: NEGATIVE
Glucose, UA: NEGATIVE mg/dL
Ketones, POC UA: NEGATIVE mg/dL
Nitrite, UA: NEGATIVE
Protein Ur, POC: NEGATIVE mg/dL
Spec Grav, UA: 1.02 (ref 1.010–1.025)
Urobilinogen, UA: 1 E.U./dL
pH, UA: 7 (ref 5.0–8.0)

## 2021-12-02 LAB — POCT URINE PREGNANCY: Preg Test, Ur: NEGATIVE

## 2021-12-02 MED ORDER — IBUPROFEN 800 MG PO TABS: 800.0000 mg | ORAL_TABLET | Freq: Three times a day (TID) | ORAL | 0 refills | Status: DC | PRN

## 2021-12-02 MED ORDER — KETOROLAC TROMETHAMINE 60 MG/2ML IM SOLN: 30.0000 mg | Freq: Once | INTRAMUSCULAR | Status: DC

## 2021-12-02 NOTE — ED Triage Notes (Signed)
Pt reports lower abdominal pain, nausea x 2-3 months.Pt reports she has a ovary cyst.  Pain is worse when having gas or she is too full. Ibuprofen and Tylenol gives some relief.

## 2021-12-02 NOTE — ED Provider Notes (Signed)
[facility]    CSN: [id number] Arrival date & time: 12/02/21  1403      History   Chief Complaint Chief Complaint  Patient presents with   Abdominal Pain    HPI Meagan Oneill is a 31 y.o. female.   Patient presenting today with 2 to 77-month history of significant constant left lower pelvic/abdominal pain.  States the pain is constant but there are episodes of severe pain where she cannot even stand up.  Her most recent episode was overnight last night.  She states she went to the emergency department once since onset for this and was told she had a left ovarian cyst.  States the pain is worse when she is too full or has gas.  Some nausea associated, no vomiting.  States her baseline is alternating between constipation and diarrhea, this is not changed since onset of pain.  Denies fever, chills, urinary symptoms, vaginal symptoms, chest pain, shortness of breath.  Trying Tylenol and ibuprofen with minimal relief.    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    S/P left knee surgery 03/25/2021    Past Surgical History:  Procedure Laterality Date   I & D EXTREMITY Left 05/13/2021   Procedure: DEBRIDEMENT LEFT KNEE;  Surgeon: Nadara Mustard, MD;  Location: [facility] OR;  Service: Orthopedics;  Laterality: Left;   KNEE ARTHROSCOPY WITH ANTERIOR CRUCIATE LIGAMENT (ACL) REPAIR WITH HAMSTRING GRAFT Left 03/25/2021   Procedure: left knee  anterior cruciate ligament, posterior cruciate ligment, posterolateral corner reconstruction arthroscopy; allograft ligaments for reconstruction, meniscal debridement vs repair;  Surgeon: Cammy Copa, MD;  Location: [facility] OR;  Service: Orthopedics;  Laterality: Left;   WISDOM TOOTH EXTRACTION      OB History     Gravida  1   Para      Term      Preterm      AB  1   Living         SAB  1   IAB      Ectopic      Multiple      Live Births               Home Medications    Prior to Admission medications   Medication Sig Start Date End Date Taking? Authorizing Provider  ibuprofen (ADVIL) 800 MG tablet Take 1 tablet (800 mg total) by mouth every 8 (eight) hours as needed. 12/02/21  Yes Particia Nearing, PA-C  metoprolol tartrate (LOPRESSOR) 25 MG tablet Take 1 tablet (25 mg total) by mouth 2 (two) times daily. 04/11/21   Love, Evlyn Kanner, PA-C  oxyCODONE-acetaminophen (PERCOCET) 5-325 MG tablet Take 1-2 tablets by mouth every 4 (four) hours as needed. 10/16/21   Gilda Crease, MD  oxyCODONE-acetaminophen (PERCOCET/ROXICET) 5-325 MG tablet Take 1 tablet by mouth every  6 (six) hours as needed for severe pain. 10/16/21   Gilda Crease, MD  rivaroxaban (XARELTO) 10 MG TABS tablet Take 1 tablet (10 mg total) by mouth daily. 04/11/21   Love, Evlyn Kanner, PA-C  ferrous sulfate 325 (65 FE) MG tablet Take 1 tablet (325 mg total) by mouth daily for 14 days. 10/21/18 09/11/19  Couture, Cortni S, PA-C    Family History Family History  Problem Relation Age of Onset   Hypertension Mother    Diabetes Mother    Hypertension Sister     Social History Social History   Tobacco Use   Smoking status: Never   Smokeless tobacco: Never  Vaping Use   Vaping Use: Never used  Substance Use Topics   Alcohol use: Not Currently    Alcohol/week: 1.0 standard drink of alcohol    Types: 1 Glasses of wine per week    Comment: occas.   Drug use: No     Allergies    Morphine and related   Review of Systems Review of Systems PER HPI  Physical Exam Triage Vital Signs ED Triage Vitals  Enc Vitals Group     BP 12/02/21 1507 (!) 146/98     Pulse Rate 12/02/21 1507 98     Resp 12/02/21 1507 20     Temp 12/02/21 1507 98.4 F (36.9 C)     Temp Source 12/02/21 1507 Oral     SpO2 12/02/21 1507 98 %     Weight --      Height --      Head Circumference --      Peak Flow --      Pain Score 12/02/21 1505 10     Pain Loc --      Pain Edu? --      Excl. in GC? --    No data found.  Updated Vital Signs BP (!) 146/98 (BP Location: Right Arm)   Pulse 98   Temp 98.4 F (36.9 C) (Oral)   Resp 20   LMP  (Within Weeks) Comment: 1 week  SpO2 98%   Visual Acuity Right Eye Distance:   Left Eye Distance:   Bilateral Distance:    Right Eye Near:   Left Eye Near:    Bilateral Near:     Physical Exam Vitals and nursing note reviewed.  Constitutional:      Appearance: Normal appearance. She is not ill-appearing.  HENT:     Head: Atraumatic.  Eyes:     Extraocular Movements: Extraocular movements intact.     Conjunctiva/sclera: Conjunctivae normal.  Cardiovascular:     Rate and Rhythm: Normal rate and regular rhythm.     Heart sounds: Normal heart sounds.  Pulmonary:     Effort: Pulmonary effort is normal.     Breath sounds: Normal breath sounds.  Abdominal:     General: Bowel sounds are normal. There is no distension.     Palpations: Abdomen is soft.     Tenderness: There is abdominal tenderness. There is no right CVA tenderness, left CVA tenderness or guarding.     Comments: Left lower quadrant tenderness to palpation without guarding or distention  Genitourinary:    Comments: GU exam deferred to GYN, declines vaginal symptoms at this time Musculoskeletal:        General: Normal range of motion.     Cervical back: Normal range of motion and neck supple.  Skin:    General: Skin is warm and dry.  Neurological:  Mental Status: She  is alert and oriented to person, place, and time.  Psychiatric:        Mood and Affect: Mood normal.        Thought Content: Thought content normal.        Judgment: Judgment normal.      UC Treatments / Results  Labs (all labs ordered are listed, but only abnormal results are displayed) Labs Reviewed  POCT URINALYSIS DIP (MANUAL ENTRY) - Abnormal; Notable for the following components:      Result Value   Blood, UA small (*)    Leukocytes, UA Trace (*)    All other components within normal limits  CBC WITH DIFFERENTIAL/PLATELET  COMPREHENSIVE METABOLIC PANEL  POCT URINE PREGNANCY  CERVICOVAGINAL ANCILLARY ONLY    EKG   Radiology No results found.  Procedures Procedures (including critical care time)  Medications Ordered in UC Medications - No data to display  Initial Impression / Assessment and Plan / UC Course  I have reviewed the triage vital signs and the nursing notes.  Pertinent labs & imaging results that were available during my care of the patient were reviewed by me and considered in my medical decision making (see chart for details).     Urinalysis without evidence of urinary tract infection, urine pregnancy negative.  CBC, CMP and vaginal swab all pending for further rule out.  Discussed importance of establishing with an OB/GYN for further evaluation, treatment of her pain.  Discussed that we do not have an ultrasound or CT scanner here to further evaluate the cause of her pain.  Will prescribe ibuprofen, provide a dose prior to discharge for pain relief and resources for OB/GYN.  Go to emergency department for worsening symptoms at any time.  Final Clinical Impressions(s) / UC Diagnoses   Final diagnoses:  LLQ pain  History of ovarian cyst   Discharge Instructions   None    ED Prescriptions     Medication Sig Dispense Auth. Provider   ibuprofen (ADVIL) 800 MG tablet Take 1 tablet (800 mg total) by mouth every 8 (eight) hours as needed. 30 tablet  Particia Nearing, [place]      PDMP not reviewed this encounter.   Particia Nearing, [place] 12/02/21 1643

## 2021-12-03 LAB — COMPREHENSIVE METABOLIC PANEL
ALT: 20 IU/L (ref 0–32)
AST: 24 IU/L (ref 0–40)
Albumin/Globulin Ratio: 1.2 (ref 1.2–2.2)
Albumin: 4.1 g/dL (ref 3.9–4.9)
Alkaline Phosphatase: 75 IU/L (ref 44–121)
BUN/Creatinine Ratio: 11 (ref 9–23)
BUN: 7 mg/dL (ref 6–20)
Bilirubin Total: 0.6 mg/dL (ref 0.0–1.2)
CO2: 24 mmol/L (ref 20–29)
Calcium: 9.2 mg/dL (ref 8.7–10.2)
Chloride: 102 mmol/L (ref 96–106)
Creatinine, Ser: 0.66 mg/dL (ref 0.57–1.00)
Globulin, Total: 3.3 g/dL (ref 1.5–4.5)
Glucose: 81 mg/dL (ref 70–99)
Potassium: 3.9 mmol/L (ref 3.5–5.2)
Sodium: 140 mmol/L (ref 134–144)
Total Protein: 7.4 g/dL (ref 6.0–8.5)
eGFR: 119 mL/min/{1.73_m2} (ref >59)

## 2021-12-03 LAB — CBC WITH DIFFERENTIAL/PLATELET
Basophils Absolute: 0.1 10*3/uL (ref 0.0–0.2)
Basos: 1 %
EOS (ABSOLUTE): 0.3 10*3/uL (ref 0.0–0.4)
Eos: 3 %
Hematocrit: 36.6 % (ref 34.0–46.6)
Hemoglobin: 10.9 g/dL — ABNORMAL LOW (ref 11.1–15.9)
Immature Grans (Abs): 0 10*3/uL (ref 0.0–0.1)
Immature Granulocytes: 0 %
Lymphocytes Absolute: 2.9 10*3/uL (ref 0.7–3.1)
Lymphs: 35 %
MCH: 20 pg — ABNORMAL LOW (ref 26.6–33.0)
MCHC: 29.8 g/dL — ABNORMAL LOW (ref 31.5–35.7)
MCV: 67 fL — ABNORMAL LOW (ref 79–97)
Monocytes Absolute: 0.5 10*3/uL (ref 0.1–0.9)
Monocytes: 6 %
Neutrophils Absolute: 4.6 10*3/uL (ref 1.4–7.0)
Neutrophils: 55 %
Platelets: 276 10*3/uL (ref 150–450)
RBC: 5.45 x10E6/uL — ABNORMAL HIGH (ref 3.77–5.28)
RDW: 21.5 % — ABNORMAL HIGH (ref 11.7–15.4)
WBC: 8.3 10*3/uL (ref 3.4–10.8)

## 2021-12-05 LAB — CERVICOVAGINAL ANCILLARY ONLY
Bacterial Vaginitis (gardnerella): NEGATIVE
Candida Glabrata: NEGATIVE
Candida Vaginitis: NEGATIVE
Chlamydia: NEGATIVE
Comment: NEGATIVE
Comment: NEGATIVE
Comment: NEGATIVE
Comment: NEGATIVE
Comment: NEGATIVE
Comment: NORMAL
Neisseria Gonorrhea: NEGATIVE
Trichomonas: POSITIVE — AB

## 2022-01-13 ENCOUNTER — Ambulatory Visit (HOSPITAL_COMMUNITY)
Admission: RE | Admit: 2022-01-13 | Discharge: 2022-01-13 | Disposition: A | Discharge status: Home / Self Care | Payer: Medicaid Other | Source: Ambulatory Visit | Attending: Nurse Practitioner | Admitting: Nurse Practitioner

## 2022-01-13 DIAGNOSIS — Z30431 Encounter for routine checking of intrauterine contraceptive device: Secondary | ICD-10-CM | POA: Insufficient documentation

## 2022-01-19 ENCOUNTER — Other Ambulatory Visit (HOSPITAL_COMMUNITY): Payer: Self-pay | Admitting: Nurse Practitioner

## 2022-01-19 ENCOUNTER — Other Ambulatory Visit: Payer: Self-pay | Admitting: Nurse Practitioner

## 2022-01-19 DIAGNOSIS — Z1388 Encounter for screening for disorder due to exposure to contaminants: Secondary | ICD-10-CM | POA: Diagnosis not present

## 2022-01-19 DIAGNOSIS — Z113 Encounter for screening for infections with a predominantly sexual mode of transmission: Secondary | ICD-10-CM | POA: Diagnosis not present

## 2022-01-19 DIAGNOSIS — Z3009 Encounter for other general counseling and advice on contraception: Secondary | ICD-10-CM | POA: Diagnosis not present

## 2022-01-19 DIAGNOSIS — N83202 Unspecified ovarian cyst, left side: Secondary | ICD-10-CM

## 2022-01-19 DIAGNOSIS — Z0389 Encounter for observation for other suspected diseases and conditions ruled out: Secondary | ICD-10-CM | POA: Diagnosis not present

## 2022-01-19 DIAGNOSIS — Z1331 Encounter for screening for depression: Secondary | ICD-10-CM | POA: Diagnosis not present

## 2022-01-19 DIAGNOSIS — Z01419 Encounter for gynecological examination (general) (routine) without abnormal findings: Secondary | ICD-10-CM | POA: Diagnosis not present

## 2022-03-10 DIAGNOSIS — Z419 Encounter for procedure for purposes other than remedying health state, unspecified: Secondary | ICD-10-CM | POA: Diagnosis not present

## 2022-03-24 ENCOUNTER — Ambulatory Visit (HOSPITAL_COMMUNITY)
Admission: RE | Admit: 2022-03-24 | Discharge: 2022-03-24 | Disposition: A | Discharge status: Home / Self Care | Payer: Medicaid Other | Source: Ambulatory Visit | Attending: Nurse Practitioner | Admitting: Nurse Practitioner

## 2022-03-24 DIAGNOSIS — N83202 Unspecified ovarian cyst, left side: Secondary | ICD-10-CM | POA: Insufficient documentation

## 2022-03-28 DIAGNOSIS — Z3009 Encounter for other general counseling and advice on contraception: Secondary | ICD-10-CM | POA: Diagnosis not present

## 2022-03-28 DIAGNOSIS — N83202 Unspecified ovarian cyst, left side: Secondary | ICD-10-CM | POA: Diagnosis not present

## 2022-03-28 DIAGNOSIS — N915 Oligomenorrhea, unspecified: Secondary | ICD-10-CM | POA: Diagnosis not present

## 2022-04-05 ENCOUNTER — Ambulatory Visit
Admission: EM | Admit: 2022-04-05 | Discharge: 2022-04-05 | Disposition: A | Discharge status: Home / Self Care | Payer: Medicaid Other | Attending: Nurse Practitioner | Admitting: Nurse Practitioner

## 2022-04-05 DIAGNOSIS — J209 Acute bronchitis, unspecified: Secondary | ICD-10-CM

## 2022-04-05 DIAGNOSIS — J029 Acute pharyngitis, unspecified: Secondary | ICD-10-CM

## 2022-04-05 LAB — POCT RAPID STREP A (OFFICE): Rapid Strep A Screen: NEGATIVE

## 2022-04-05 MED ORDER — PSEUDOEPH-BROMPHEN-DM 30-2-10 MG/5ML PO SYRP: 5.0000 mL | ORAL_SOLUTION | Freq: Four times a day (QID) | ORAL | 0 refills | Status: DC | PRN

## 2022-04-05 MED ORDER — PREDNISONE 20 MG PO TABS: 40.0000 mg | ORAL_TABLET | Freq: Every day | ORAL | 0 refills | Status: AC

## 2022-04-05 MED ORDER — ALBUTEROL SULFATE HFA 108 (90 BASE) MCG/ACT IN AERS: 2.0000 | INHALATION_SPRAY | Freq: Four times a day (QID) | RESPIRATORY_TRACT | 0 refills | Status: DC | PRN

## 2022-04-05 NOTE — Discharge Instructions (Addendum)
Take medication as prescribed. Increase fluids and allow for plenty of rest. Warm salt water gargles 3-4 times daily while throat symptoms persist. Recommend using a humidifier in the bedroom at nighttime during sleep and sleeping elevated on pillows while cough symptoms persist. Recommend using throat lozenges and cough drops to help with cough. Please be advised that the cough may persist for several weeks.  If you are feeling well, but continued to have a nagging cough, continue use of throat lozenges or cough drops, and increase your fluid intake.  If you develop new symptoms such as fever, wheezing, shortness of breath, or difficulty breathing, please follow-up in this clinic or with your primary care physician for further evaluation. Follow-up as needed.

## 2022-04-05 NOTE — ED Provider Notes (Signed)
[facility]    CSN: [id number] Arrival date & time: 04/05/22  1502      History   Chief Complaint Chief Complaint  Patient presents with   Sore Throat   Cough    HPI Meagan Oneill is a 31 y.o. female.   The history is provided by the patient.   The patient presents for complaints of cough, fever, and sore throat.  Patient states symptoms started on 12/22 after she received her flu shot.  She states cough has been persistent, but over the last 24 hours she has developed fever and sore throat.  She reports fever was as high as 103 today.  She states that the sore throat is due to the persistent coughing.  She also endorses wheezing.  She denies headache, ear pain, shortness of breath, nausea, vomiting, or diarrhea.  She reports she has been taking an old prescription of Promethazine DM to help with her cough.  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    S/P left knee surgery 03/25/2021    Past Surgical History:  Procedure Laterality Date   I & D EXTREMITY Left 05/13/2021   Procedure: DEBRIDEMENT LEFT KNEE;  Surgeon: Nadara Mustard, MD;  Location: [facility] OR;  Service: Orthopedics;  Laterality: Left;   KNEE ARTHROSCOPY WITH ANTERIOR CRUCIATE LIGAMENT (ACL) REPAIR WITH HAMSTRING GRAFT Left 03/25/2021   Procedure: left knee anterior cruciate ligament, posterior cruciate ligment, posterolateral corner reconstruction arthroscopy;  allograft ligaments for reconstruction, meniscal debridement vs repair;  Surgeon: Cammy Copa, MD;  Location: [facility] OR;  Service: Orthopedics;  Laterality: Left;   WISDOM TOOTH EXTRACTION      OB History     Gravida  1   Para      Term      Preterm      AB  1   Living         SAB  1   IAB      Ectopic      Multiple      Live Births               Home Medications    Prior to Admission medications   Medication Sig Start Date End Date Taking? Authorizing Provider  albuterol (VENTOLIN HFA) 108 (90 Base) MCG/ACT inhaler Inhale 2 puffs into the lungs every 6 (six) hours as needed for wheezing or shortness of breath. 04/05/22  Yes Jerren Flinchbaugh-Warren, Sadie Haber, NP  brompheniramine-pseudoephedrine-DM 30-2-10 MG/5ML syrup Take 5 mLs by mouth 4 (four) times daily as needed. 04/05/22  Yes Macai Sisneros-Warren, Sadie Haber, NP  predniSONE (DELTASONE) 20 MG tablet Take 2 tablets (40 mg total) by mouth daily with breakfast for 5 days. 04/05/22 04/10/22 Yes Jackob Crookston-Warren, Sadie Haber, NP  ibuprofen (ADVIL) 800 MG tablet Take 1 tablet (800 mg total) by mouth every 8 (eight)  hours as needed. 12/02/21   Particia NearingLane, Rachel Elizabeth, PA-C  metoprolol tartrate (LOPRESSOR) 25 MG tablet Take 1 tablet (25 mg total) by mouth 2 (two) times daily. 04/11/21   Love, Evlyn KannerPamela S, PA-C  oxyCODONE-acetaminophen (PERCOCET) 5-325 MG tablet Take 1-2 tablets by mouth every 4 (four) hours as needed. 10/16/21   Gilda CreasePollina, Christopher J, MD  oxyCODONE-acetaminophen (PERCOCET/ROXICET) 5-325 MG tablet Take 1 tablet by mouth every 6 (six) hours as needed for severe pain. 10/16/21   Gilda CreasePollina, Christopher J, MD  rivaroxaban (XARELTO) 10 MG TABS tablet Take 1 tablet (10 mg total) by mouth daily. 04/11/21   Love, Evlyn KannerPamela S, PA-C  ferrous sulfate 325 (65 FE) MG tablet Take 1 tablet (325 mg total) by mouth daily for 14 days. 10/21/18 09/11/19  Couture, Cortni S, PA-C    Family History Family History  Problem Relation Age of Onset   Hypertension  Mother    Diabetes Mother    Hypertension Sister     Social History Social History   Tobacco Use   Smoking status: Never   Smokeless tobacco: Never  Vaping Use   Vaping Use: Never used  Substance Use Topics   Alcohol use: Not Currently    Alcohol/week: 1.0 standard drink of alcohol    Types: 1 Glasses of wine per week    Comment: occas.   Drug use: No     Allergies   Morphine and related   Review of Systems Review of Systems Per HPI  Physical Exam Triage Vital Signs ED Triage Vitals  Enc Vitals Group     BP 04/05/22 1625 (!) 136/97     Pulse Rate 04/05/22 1625 (!) 104     Resp 04/05/22 1625 19     Temp 04/05/22 1625 98.1 F (36.7 C)     Temp Source 04/05/22 1625 Oral     SpO2 04/05/22 1625 96 %     Weight --      Height --      Head Circumference --      Peak Flow --      Pain Score 04/05/22 1624 7     Pain Loc --      Pain Edu? --      Excl. in GC? --    No data found.  Updated Vital Signs BP (!) 136/97 (BP Location: Right Wrist)   Pulse (!) 104   Temp 98.1 F (36.7 C) (Oral)   Resp 19   LMP  (Within Weeks) Comment: 2 weeks  SpO2 96%   Visual Acuity Right Eye Distance:   Left Eye Distance:   Bilateral Distance:    Right Eye Near:   Left Eye Near:    Bilateral Near:     Physical Exam Vitals and nursing note reviewed.  Constitutional:      General: She is not in acute distress.    Appearance: She is well-developed.  HENT:     Head: Normocephalic.     Right Ear: Tympanic membrane and ear canal normal.     Left Ear: Tympanic membrane and ear canal normal.     Nose: No congestion.     Mouth/Throat:     Pharynx: Uvula midline. Posterior oropharyngeal erythema present. No pharyngeal swelling.  Eyes:     Conjunctiva/sclera: Conjunctivae normal.     Pupils: Pupils are equal, round, and reactive to light.  Cardiovascular:     Rate and Rhythm: Normal rate and regular rhythm.     Heart sounds: Normal heart sounds.  Pulmonary:     Effort:  Pulmonary effort is normal. No respiratory distress.     Breath sounds: Normal breath sounds. No stridor. No wheezing, rhonchi or rales.  Abdominal:     General: Bowel sounds are normal.     Palpations: Abdomen is soft.     Tenderness: There is no abdominal tenderness.  Musculoskeletal:     Cervical back: Normal range of motion and neck supple.  Skin:    General: Skin is warm and dry.  Neurological:     General: No focal deficit present.     Mental Status: She is alert and oriented to person, place, and time.  Psychiatric:        Mood and Affect: Mood normal.        Behavior: Behavior normal.      UC Treatments / Results  Labs (all labs ordered are listed, but only abnormal results are displayed) Labs Reviewed  POCT RAPID STREP A (OFFICE)    EKG   Radiology No results found.  Procedures Procedures (including critical care time)  Medications Ordered in UC Medications - No data to display  Initial Impression / Assessment and Plan / UC Course  I have reviewed the triage vital signs and the nursing notes.  Pertinent labs & imaging results that were available during my care of the patient were reviewed by me and considered in my medical decision making (see chart for details).  The patient is well-appearing, she is in no acute distress, vital signs are stable.    Symptoms are consistent with acute bronchitis due to the persistence of her cough.  Will start patient on prednisone 40 mg for 5 days, Bromfed-DM, and an albuterol inhaler for any wheezing or shortness of breath given her history of asthma.  Supportive care recommendations were provided to the patient along with strict indications of when to go to the emergency department.  Patient verbalizes understanding.  All questions were answered.  Patient is stable for discharge.  Note was provided for work.   Final Clinical Impressions(s) / UC Diagnoses   Final diagnoses:  Acute bronchitis, unspecified organism      Discharge Instructions      Take medication as prescribed. Increase fluids and allow for plenty of rest. Warm salt water gargles 3-4 times daily while throat symptoms persist. Recommend using a humidifier in the bedroom at nighttime during sleep and sleeping elevated on pillows while cough symptoms persist. Recommend using throat lozenges and cough drops to help with cough. Please be advised that the cough may persist for several weeks.  If you are feeling well, but continued to have a nagging cough, continue use of throat lozenges or cough drops, and increase your fluid intake.  If you develop new symptoms such as fever, wheezing, shortness of breath, or difficulty breathing, please follow-up in this clinic or with your primary care physician for further evaluation. Follow-up as needed.     ED Prescriptions     Medication Sig Dispense Auth. Provider   brompheniramine-pseudoephedrine-DM 30-2-10 MG/5ML syrup Take 5 mLs by mouth 4 (four) times daily as needed. 140 mL Knowledge Escandon-Warren, Sadie Haber, NP   predniSONE (DELTASONE) 20 MG tablet Take 2 tablets (40 mg total) by mouth daily with breakfast for 5 days. 10 tablet Dominiq Fontaine-Warren, Sadie Haber, NP   albuterol (VENTOLIN HFA) 108 (90 Base) MCG/ACT inhaler Inhale 2 puffs into the lungs every 6 (six) hours as needed for wheezing or shortness of breath. 8 g Landi Biscardi-Warren, Sadie Haber, NP  PDMP not reviewed this encounter.   Abran Cantor, NP 04/05/22 1658

## 2022-04-05 NOTE — ED Triage Notes (Signed)
Pt reporst cough x 1 week; fever 103.0 F and sore throat x 1 day.   Reports she had Flu shot on 03/31/22

## 2022-04-08 ENCOUNTER — Ambulatory Visit
Admission: EM | Admit: 2022-04-08 | Discharge: 2022-04-08 | Disposition: A | Discharge status: Home / Self Care | Payer: Medicaid Other | Attending: Family Medicine | Admitting: Family Medicine

## 2022-04-08 DIAGNOSIS — J4521 Mild intermittent asthma with (acute) exacerbation: Secondary | ICD-10-CM

## 2022-04-08 DIAGNOSIS — J069 Acute upper respiratory infection, unspecified: Secondary | ICD-10-CM | POA: Diagnosis not present

## 2022-04-08 MED ORDER — TIZANIDINE HCL 2 MG PO CAPS: 2.0000 mg | ORAL_CAPSULE | Freq: Three times a day (TID) | ORAL | 0 refills | Status: DC | PRN

## 2022-04-08 MED ORDER — AZITHROMYCIN 250 MG PO TABS: ORAL_TABLET | ORAL | 0 refills | Status: DC

## 2022-04-08 NOTE — ED Provider Notes (Signed)
[facility]    CSN: [id number] Arrival date & time: 04/08/22  1403      History   Chief Complaint Chief Complaint  Patient presents with   Cough    HPI Meagan Oneill is a 31 y.o. female.   Patient presenting today following up on visit from 3 days ago for several day history of congestion, productive cough, chest tightness, shortness of breath, wheezing.  She was prescribed cough syrup, prednisone and albuterol at this appointment, states she started her medications yesterday and is not feeling any better, feeling worse.  Now having pain in the left anterior rib that sometimes shoots to the back with deep breaths.  She states she gets very nervous particularly at night when she lays down as she feels very short of breath and has a lot of chest tightness.  Denies fever, chills, abdominal pain, nausea vomiting or diarrhea.  History of asthma on albuterol as needed.    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    S/P left knee surgery 03/25/2021    Past Surgical History:  Procedure Laterality Date   I & D EXTREMITY Left 05/13/2021   Procedure: DEBRIDEMENT LEFT KNEE;  Surgeon: Nadara Mustard, MD;  Location: [facility] OR;  Service: Orthopedics;  Laterality: Left;   KNEE ARTHROSCOPY WITH ANTERIOR CRUCIATE LIGAMENT (ACL) REPAIR WITH HAMSTRING GRAFT Left 03/25/2021   Procedure: left knee anterior cruciate ligament, posterior cruciate  ligment, posterolateral corner reconstruction arthroscopy; allograft ligaments for reconstruction, meniscal debridement vs repair;  Surgeon: Cammy Copa, MD;  Location: [facility] OR;  Service: Orthopedics;  Laterality: Left;   WISDOM TOOTH EXTRACTION      OB History     Gravida  1   Para      Term      Preterm      AB  1   Living         SAB  1   IAB      Ectopic      Multiple      Live Births               Home Medications    Prior to Admission medications   Medication Sig Start Date End Date Taking? Authorizing Provider  azithromycin (ZITHROMAX) 250 MG tablet Take first 2 tablets together, then 1 every day until finished. 04/08/22  Yes Particia Nearing, PA-C  tizanidine (ZANAFLEX) 2 MG capsule Take 1 capsule (2 mg total) by mouth 3 (three) times daily as needed for muscle spasms. Do not drink alcohol or drive while taking this medication.  May cause drowsiness. 04/08/22  Yes Particia Nearing, PA-C  albuterol (VENTOLIN HFA) 108 (90 Base) MCG/ACT inhaler Inhale 2 puffs into the lungs every 6 (six) hours as needed for wheezing or shortness of breath. 04/05/22   Leath-Warren, Lorene Dy  J, NP  brompheniramine-pseudoephedrine-DM 30-2-10 MG/5ML syrup Take 5 mLs by mouth 4 (four) times daily as needed. 04/05/22   Leath-Warren, Sadie Haber, NP  ibuprofen (ADVIL) 800 MG tablet Take 1 tablet (800 mg total) by mouth every 8 (eight) hours as needed. 12/02/21   Particia Nearing, PA-C  metoprolol tartrate (LOPRESSOR) 25 MG tablet Take 1 tablet (25 mg total) by mouth 2 (two) times daily. 04/11/21   Love, Evlyn Kanner, PA-C  oxyCODONE-acetaminophen (PERCOCET) 5-325 MG tablet Take 1-2 tablets by mouth every 4 (four) hours as needed. 10/16/21   Gilda Crease, MD  oxyCODONE-acetaminophen (PERCOCET/ROXICET) 5-325 MG tablet Take 1 tablet by mouth every 6 (six) hours as needed for severe pain. 10/16/21   Gilda Crease, MD  predniSONE (DELTASONE) 20 MG tablet Take 2  tablets (40 mg total) by mouth daily with breakfast for 5 days. 04/05/22 04/10/22  Leath-Warren, Sadie Haber, NP  rivaroxaban (XARELTO) 10 MG TABS tablet Take 1 tablet (10 mg total) by mouth daily. 04/11/21   Love, Evlyn Kanner, PA-C  ferrous sulfate 325 (65 FE) MG tablet Take 1 tablet (325 mg total) by mouth daily for 14 days. 10/21/18 09/11/19  Couture, Cortni S, PA-C    Family History Family History  Problem Relation Age of Onset   Hypertension Mother    Diabetes Mother    Hypertension Sister     Social History Social History   Tobacco Use   Smoking status: Never   Smokeless tobacco: Never  Vaping Use   Vaping Use: Never used  Substance Use Topics   Alcohol use: Not Currently    Alcohol/week: 1.0 standard drink of alcohol    Types: 1 Glasses of wine per week    Comment: occas.   Drug use: No     Allergies   Morphine and related   Review of Systems Review of Systems Per HPI  Physical Exam Triage Vital Signs ED Triage Vitals  Enc Vitals Group     BP 04/08/22 1544 125/89     Pulse Rate 04/08/22 1542 91     Resp 04/08/22 1542 16     Temp 04/08/22 1542 97.9 F (36.6 C)     Temp Source 04/08/22 1542 Oral     SpO2 04/08/22 1542 98 %     Weight --      Height --      Head Circumference --      Peak Flow --      Pain Score 04/08/22 1541 0     Pain Loc --      Pain Edu? --      Excl. in GC? --    No data found.  Updated Vital Signs BP 125/89 (BP Location: Right Arm)   Pulse 91   Temp 97.9 F (36.6 C) (Oral)   Resp 16   LMP 11/11/2021 (Approximate)   SpO2 98%   Visual Acuity Right Eye Distance:   Left Eye Distance:   Bilateral Distance:    Right Eye Near:   Left Eye Near:    Bilateral Near:     Physical Exam Vitals and nursing note reviewed.  Constitutional:      Appearance: Normal appearance.  HENT:     Head: Atraumatic.     Right Ear: Tympanic membrane and external ear normal.     Left Ear: Tympanic membrane and external ear normal.     Nose:  Rhinorrhea present.     Mouth/Throat:     Mouth: Mucous membranes  are moist.     Pharynx: Posterior oropharyngeal erythema present.  Eyes:     Extraocular Movements: Extraocular movements intact.     Conjunctiva/sclera: Conjunctivae normal.  Cardiovascular:     Rate and Rhythm: Normal rate and regular rhythm.     Heart sounds: Normal heart sounds.  Pulmonary:     Effort: Pulmonary effort is normal.     Breath sounds: Normal breath sounds. No wheezing or rales.  Musculoskeletal:        General: Tenderness present. Normal range of motion.     Cervical back: Normal range of motion and neck supple.     Comments: Diffuse mild tenderness to palpation over left anterior rib region, no deformities, edema or bruising  Skin:    General: Skin is warm and dry.  Neurological:     Mental Status: She is alert and oriented to person, place, and time.  Psychiatric:        Mood and Affect: Mood normal.        Thought Content: Thought content normal.      UC Treatments / Results  Labs (all labs ordered are listed, but only abnormal results are displayed) Labs Reviewed - No data to display  EKG   Radiology No results found.  Procedures Procedures (including critical care time)  Medications Ordered in UC Medications - No data to display  Initial Impression / Assessment and Plan / UC Course  I have reviewed the triage vital signs and the nursing notes.  Pertinent labs & imaging results that were available during my care of the patient were reviewed by me and considered in my medical decision making (see chart for details).     Patient states she just started her prescribed medications yesterday which is probably why she is not feeling any better yet.  Complete these medications as prescribed, add Zanaflex and heat for likely muscular strain causing her left-sided anterior rib pain.  Did discuss if she is not feeling better or worsening over the next 4 to 5 days that she should start the  antibiotic but that hopefully this would not be necessary.  Return for worsening symptoms.  Final Clinical Impressions(s) / UC Diagnoses   Final diagnoses:  Viral URI with cough  Mild intermittent asthma with acute exacerbation     Discharge Instructions      Your exam and vital signs are very reassuring today.  Continue taking the prednisone, this should get your asthma under good control and get you feeling much better.  Use the albuterol every 4 hours as needed, the more frequently the better until you are feeling much better.  Take the Mucinex, cough syrup regularly as well until feeling better.  If you significantly worsen over the next 4 to 5 days, I have sent over an antibiotic that you should start but hopefully you will not end up needing this and you will feel much better soon.  Regarding the left-sided rib pain, I suspect this is a muscle strain from coughing so hard.  I have sent over a muscle relaxer and you may use heating pads and massage to help with this.    ED Prescriptions     Medication Sig Dispense Auth. Provider   tizanidine (ZANAFLEX) 2 MG capsule Take 1 capsule (2 mg total) by mouth 3 (three) times daily as needed for muscle spasms. Do not drink alcohol or drive while taking this medication.  May cause drowsiness. 15 capsule Particia NearingLane, Wilburta Milbourn Elizabeth, PA-C   azithromycin [facility](ZITHROMAX)  250 MG tablet Take first 2 tablets together, then 1 every day until finished. 6 tablet Particia Nearing, [place]      PDMP not reviewed this encounter.   Particia Nearing, [place] 04/08/22 [phone]

## 2022-04-08 NOTE — Discharge Instructions (Signed)
Your exam and vital signs are very reassuring today.  Continue taking the prednisone, this should get your asthma under good control and get you feeling much better.  Use the albuterol every 4 hours as needed, the more frequently the better until you are feeling much better.  Take the Mucinex, cough syrup regularly as well until feeling better.  If you significantly worsen over the next 4 to 5 days, I have sent over an antibiotic that you should start but hopefully you will not end up needing this and you will feel much better soon.  Regarding the left-sided rib pain, I suspect this is a muscle strain from coughing so hard.  I have sent over a muscle relaxer and you may use heating pads and massage to help with this.

## 2022-04-08 NOTE — ED Notes (Signed)
Pt in [facility] states she wants a chest xray because she is not feeling any better after being seen 3 days ago. States she is having difficulty breathing. Pts HR 87,Sat 99% on room air and RR 16 even and unlabored. Explained to patient that there were 10 people in front of her waiting to be seen and that if her conditioned changed to let one of the staff know.

## 2022-04-08 NOTE — ED Triage Notes (Signed)
Pt states cough,SOB seen here 3 days ago for same on taking medications as prescribed. States she is not feeling any better.

## 2022-04-10 DIAGNOSIS — Z419 Encounter for procedure for purposes other than remedying health state, unspecified: Secondary | ICD-10-CM | POA: Diagnosis not present

## 2022-05-11 DIAGNOSIS — Z419 Encounter for procedure for purposes other than remedying health state, unspecified: Secondary | ICD-10-CM | POA: Diagnosis not present

## 2022-05-18 ENCOUNTER — Telehealth: Payer: Self-pay

## 2022-05-18 NOTE — Telephone Encounter (Signed)
Mychart msg sent. AS, CMA 

## 2022-06-08 ENCOUNTER — Encounter: Payer: Self-pay | Admitting: Radiology

## 2022-06-09 DIAGNOSIS — Z419 Encounter for procedure for purposes other than remedying health state, unspecified: Secondary | ICD-10-CM | POA: Diagnosis not present

## 2022-07-10 DIAGNOSIS — Z419 Encounter for procedure for purposes other than remedying health state, unspecified: Secondary | ICD-10-CM | POA: Diagnosis not present

## 2022-08-09 DIAGNOSIS — Z419 Encounter for procedure for purposes other than remedying health state, unspecified: Secondary | ICD-10-CM | POA: Diagnosis not present

## 2023-03-27 ENCOUNTER — Encounter: Payer: Self-pay | Admitting: Family Medicine

## 2023-03-27 ENCOUNTER — Ambulatory Visit (INDEPENDENT_AMBULATORY_CARE_PROVIDER_SITE_OTHER): Payer: BC Managed Care – PPO | Admitting: Family Medicine

## 2023-03-27 VITALS — BP 117/83 | HR 106 | Ht 63.0 in | Wt 385.0 lb

## 2023-03-27 DIAGNOSIS — E038 Other specified hypothyroidism: Secondary | ICD-10-CM

## 2023-03-27 DIAGNOSIS — Z1159 Encounter for screening for other viral diseases: Secondary | ICD-10-CM

## 2023-03-27 DIAGNOSIS — A64 Unspecified sexually transmitted disease: Secondary | ICD-10-CM

## 2023-03-27 DIAGNOSIS — F411 Generalized anxiety disorder: Secondary | ICD-10-CM | POA: Diagnosis not present

## 2023-03-27 DIAGNOSIS — Z01818 Encounter for other preprocedural examination: Secondary | ICD-10-CM

## 2023-03-27 DIAGNOSIS — Z114 Encounter for screening for human immunodeficiency virus [HIV]: Secondary | ICD-10-CM

## 2023-03-27 DIAGNOSIS — R7301 Impaired fasting glucose: Secondary | ICD-10-CM

## 2023-03-27 DIAGNOSIS — E7849 Other hyperlipidemia: Secondary | ICD-10-CM

## 2023-03-27 DIAGNOSIS — E559 Vitamin D deficiency, unspecified: Secondary | ICD-10-CM

## 2023-03-27 MED ORDER — HYDROXYZINE PAMOATE 25 MG PO CAPS: 25.0000 mg | ORAL_CAPSULE | Freq: Three times a day (TID) | ORAL | 1 refills | Status: AC | PRN

## 2023-03-27 MED ORDER — HYDROXYZINE PAMOATE 25 MG PO CAPS: 25.0000 mg | ORAL_CAPSULE | Freq: Every evening | ORAL | 1 refills | Status: DC | PRN

## 2023-03-27 NOTE — Assessment & Plan Note (Signed)
The patient is encouraged to engage in 150 minutes of moderate-intensity physical activity weekly and to implement lifestyle changes, including adopting a heart-healthy diet. The patient verbalized understanding and is aware of the plan of care.

## 2023-03-27 NOTE — Patient Instructions (Addendum)
I appreciate the opportunity to provide care to you today!    Follow up:  2 weeks for menstrual cycle, ADHD  Labs: please stop by the lab today or during the week to get your blood drawn (CBC, CMP, TSH, Lipid profile, HgA1c, Vit D)  Screening: HIV and Hep C  Pre-operative clearance: -Stop by [facility] to get a chest x-ray. Anxiety and depression: Start taking hydroxyzine 25 mg every 8 hours as needed for severe anxiety. Nonpharmacologic management of anxiety and depression  Mindfulness and Meditation Practices like mindfulness meditation can help reduce symptoms by promoting relaxation and present-moment awareness.  Exercise  Regular physical activity has been shown to improve mood and reduce anxiety through the release of endorphins and other neurochemicals.  Healthy Diet Eating a balanced diet rich in fruits, vegetables, whole grains, and lean proteins can support overall mental health.  Sleep Hygiene  Establishing a regular sleep routine and ensuring good sleep quality can significantly impact mood and anxiety levels.  Stress Management Techniques Activities such as yoga, tai chi, and deep breathing exercises can help manage stress.  Social Support Maintaining strong relationships and seeking support from friends, family, or support groups can provide emotional comfort and reduce feelings of isolation.  Lifestyle Modifications Reducing alcohol and caffeine intake, quitting smoking, and avoiding recreational drugs can improve symptoms.  Art and Music Therapy Engaging in creative activities like painting, drawing, or playing music can be therapeutic and help express emotions.  Light Therapy Particularly useful for seasonal affective disorder (SAD), exposure to bright light can help regulate mood.  Referrals today-  integrated behavioral health   Attached with your AVS, you will find valuable resources for self-education. I highly recommend dedicating some time to thoroughly  examine them.   Please continue to a heart-healthy diet and increase your physical activities. Try to exercise for at least five days a week.    It was a pleasure to see you and I look forward to continuing to work together on your health and well-being. Please do not hesitate to call the office if you need care or have questions about your care.  In case of emergency, please visit the Emergency Department for urgent care, or contact our clinic at [phone] to schedule an appointment. We're here to help you!   Have a wonderful day and week. With Gratitude, Gilmore Laroche MSN, FNP-BC

## 2023-03-27 NOTE — Progress Notes (Signed)
New Patient Office Visit  Subjective:  Patient ID: Meagan Oneill, female    DOB: [birth year]  Age: 32 y.o. MRN: [id number]  CC:  Chief Complaint  Patient presents with   New Patient (Initial Visit)    Establishing care. Pt has concerns about her menstrual cycle , needs a form completed for weight loss surgery clearance. Would like to discuss anxiety and depression and reports adhd dx given last year. Would like to do std screening.     HPI Meagan Oneill is a 32 y.o. female with past medical history of obesity presents for establishing care.  Obesity:The patient is seeking medical clearance for bariatric surgery, although the surgery has not yet been scheduled. She reports that her weight has contributed to feelings of depression and acknowledges not being as active as she would like to be. She mentions that her condition has worsened over time.  Anxiety and Depression:The patient reports experiencing testing anxiety and social anxiety with excessive worry, which has been ongoing for approximately three years. She states that her anxiety has worsened since undergoing leg surgery on 03/2022. She mentions having good social support but feels overwhelmed with recent events in her life. She also shares that she has not achieved the goals she has set for herself, contributing to her stress. Additionally, she recently ended a relationship in which she felt unappreciated despite giving her all. The patient denies suicidal thoughts or ideation.  STD Testing:The patient requests STD testing today. She reports engaging in high-risk sexual behaviors, including manic sexual activity, and wishes to be tested for potential infections. She denies experiencing any symptoms such as vaginal odor, discharge, or irritation.        Past Medical History:  Diagnosis Date   Anemia    Asthma    no inhaler   Environmental allergies    Hypertension     Past Surgical History:  Procedure Laterality Date   I & D  EXTREMITY Left 05/13/2021   Procedure: DEBRIDEMENT LEFT KNEE;  Surgeon: Nadara Mustard, MD;  Location: [facility] OR;  Service: Orthopedics;  Laterality: Left;   KNEE ARTHROSCOPY WITH ANTERIOR CRUCIATE LIGAMENT (ACL) REPAIR WITH HAMSTRING GRAFT Left 03/25/2021   Procedure: left knee anterior cruciate ligament, posterior cruciate ligment, posterolateral corner reconstruction arthroscopy; allograft ligaments for reconstruction, meniscal debridement vs repair;  Surgeon: Cammy Copa, MD;  Location: [facility] OR;  Service: Orthopedics;  Laterality: Left;   WISDOM TOOTH EXTRACTION      Family History  Problem Relation Age of Onset   Hypertension Mother    Diabetes Mother    Hypertension Sister     Social History   Socioeconomic History   Marital status: Single    Spouse name: Not on file   Number of children: Not on file   Years of education: Not on file   Highest education level: Not on file  Occupational History   Not on file  Tobacco Use   Smoking status: Never   Smokeless tobacco: Never  Vaping Use   Vaping status: Never Used  Substance and Sexual Activity   Alcohol use: Not Currently    Alcohol/week: 1.0 standard drink of alcohol    Types: 1 Glasses of wine per week    Comment: occas.   Drug use: No   Sexual activity: Yes    Birth control/protection: None  Other Topics Concern   Not on file  Social History Narrative   Not on file   Social Drivers of Health   Financial  Resource Strain: Not on file  Food Insecurity: Not on file  Transportation Needs: Not on file  Physical Activity: Not on file  Stress: Not on file  Social Connections: Unknown (08/09/2021)   Received from [facility], [facility]   Social Network    Social Network: Not on file  Intimate Partner Violence: Unknown (07/15/2021)   Received from [facility], [facility]   HITS    Physically Hurt: Not on file    Insult or Talk Down To: Not on file    Threaten Physical Harm: Not on file    Scream or Curse:  Not on file    ROS Review of Systems  Constitutional:  Negative for chills and fever.  Eyes:  Negative for visual disturbance.  Respiratory:  Negative for chest tightness and shortness of breath.   Neurological:  Negative for dizziness and headaches.  Psychiatric/Behavioral:  Negative for self-injury and suicidal ideas.     Objective:   Today's Vitals: BP 117/83   Pulse (!) 106   Ht 5\' 3"  (1.6 m)   Wt (!) 385 lb (174.6 kg)   SpO2 94%   BMI 68.20 kg/m   Physical Exam Constitutional:      Appearance: She is obese.  HENT:     Head: Normocephalic.     Mouth/Throat:     Mouth: Mucous membranes are moist.  Cardiovascular:     Rate and Rhythm: Normal rate.     Heart sounds: Normal heart sounds.  Pulmonary:     Effort: Pulmonary effort is normal.     Breath sounds: Normal breath sounds.  Neurological:     Mental Status: She is alert.  Psychiatric:        Mood and Affect: Mood is anxious and depressed.      Assessment & Plan:   GAD (generalized anxiety disorder) Assessment & Plan: The patient will initiate therapy with hydrolizine 25 mg to be taken every 8 hours as needed for severe anxiety. Additionally, the patient was encouraged to implement nonpharmacological interventions, including deep breathing exercises, meditation, mindfulness, and engaging in regular physical activity with a heart-healthy diet.    03/27/2023   11:38 AM 03/27/2023   10:56 AM 03/27/2023   10:26 AM  GAD 7 : Generalized Anxiety Score  Nervous, Anxious, on Edge 3 3 0  Control/stop worrying 3 3 0  Worry too much - different things 3 3 0  Trouble relaxing 3 3 0  Restless 2 2 0  Easily annoyed or irritable 3 3 0  Afraid - awful might happen 2 2 0  Total GAD 7 Score 19 19 0  Anxiety Difficulty Very difficult Very difficult Not difficult at all      Orders: -     Amb ref to Integrated Behavioral Health -     hydrOXYzine Pamoate; Take 1 capsule (25 mg total) by mouth every 8 (eight) hours as  needed.  Dispense: 30 capsule; Refill: 1  STD (female) Assessment & Plan: The patient's nuswab is pending. The patient is encouraged to engage in safe and protected sexual practices, including the use of condoms, dental dams, and gloves. She is advised to avoid high-risk behaviors, such as unprotected sex with multiple or anonymous partners and sharing sex toys without proper cleaning or the use of condoms.   Orders: -     NuSwab Vaginitis Plus (VG+)  Severe obesity (BMI >= 40) (HCC) Assessment & Plan: The patient is encouraged to engage in 150 minutes of moderate-intensity physical  activity weekly and to implement lifestyle changes, including adopting a heart-healthy diet. The patient verbalized understanding and is aware of the plan of care.    Preoperative clearance Assessment & Plan: The patient's EKG shows normal sinus rhythm Pending lab results include a chest X-ray, urinalysis, CBC, BMP, and hemoglobin A1c The necessary forms will be completed once these results are available.    Orders: -     DG Chest 2 View -     EKG 12-Lead -     Urinalysis  IFG (impaired fasting glucose) -     Hemoglobin A1c  Vitamin D deficiency -     VITAMIN D 25 Hydroxy (Vit-D Deficiency, Fractures)  Need for hepatitis C screening test -     Hepatitis C antibody  Encounter for screening for HIV -     HIV Antibody (routine testing w rflx)  TSH (thyroid-stimulating hormone deficiency) -     TSH + free T4  Other hyperlipidemia -     Lipid panel -     CMP14+EGFR -     CBC with Differential/Platelet  Note: This chart has been completed using Engineer, civil (consulting) software, and while attempts have been made to ensure accuracy, certain words and phrases may not be transcribed as intended.     Follow-up: Return in about 2 weeks (around 04/10/2023) for adhd/ menstrual problems.   Gilmore Laroche, FNP

## 2023-03-27 NOTE — Assessment & Plan Note (Signed)
The patient's EKG shows normal sinus rhythm Pending lab results include a chest X-ray, urinalysis, CBC, BMP, and hemoglobin A1c The necessary forms will be completed once these results are available.

## 2023-03-27 NOTE — Assessment & Plan Note (Signed)
The patient will initiate therapy with hydrolizine 25 mg to be taken every 8 hours as needed for severe anxiety. Additionally, the patient was encouraged to implement nonpharmacological interventions, including deep breathing exercises, meditation, mindfulness, and engaging in regular physical activity with a heart-healthy diet.    03/27/2023   11:38 AM 03/27/2023   10:56 AM 03/27/2023   10:26 AM  GAD 7 : Generalized Anxiety Score  Nervous, Anxious, on Edge 3 3 0  Control/stop worrying 3 3 0  Worry too much - different things 3 3 0  Trouble relaxing 3 3 0  Restless 2 2 0  Easily annoyed or irritable 3 3 0  Afraid - awful might happen 2 2 0  Total GAD 7 Score 19 19 0  Anxiety Difficulty Very difficult Very difficult Not difficult at all

## 2023-03-27 NOTE — Assessment & Plan Note (Signed)
The patient's nuswab is pending. The patient is encouraged to engage in safe and protected sexual practices, including the use of condoms, dental dams, and gloves. She is advised to avoid high-risk behaviors, such as unprotected sex with multiple or anonymous partners and sharing sex toys without proper cleaning or the use of condoms.

## 2023-03-30 LAB — NUSWAB VAGINITIS PLUS (VG+)
Atopobium vaginae: HIGH {score} — AB
BVAB 2: HIGH {score} — AB
Candida albicans, NAA: NEGATIVE
Candida glabrata, NAA: NEGATIVE
Chlamydia trachomatis, NAA: NEGATIVE
Megasphaera 1: HIGH {score} — AB
Neisseria gonorrhoeae, NAA: NEGATIVE
Trich vag by NAA: POSITIVE — AB

## 2023-04-02 ENCOUNTER — Other Ambulatory Visit: Payer: Self-pay | Admitting: Family Medicine

## 2023-04-02 DIAGNOSIS — A599 Trichomoniasis, unspecified: Secondary | ICD-10-CM

## 2023-04-02 DIAGNOSIS — B9689 Other specified bacterial agents as the cause of diseases classified elsewhere: Secondary | ICD-10-CM

## 2023-04-02 DIAGNOSIS — N760 Acute vaginitis: Secondary | ICD-10-CM

## 2023-04-02 MED ORDER — METRONIDAZOLE 500 MG PO TABS: 500.0000 mg | ORAL_TABLET | Freq: Two times a day (BID) | ORAL | 0 refills | Status: AC

## 2023-04-02 NOTE — Progress Notes (Signed)
I called and spoke with the patient, informing her that a prescription for Flagyl has been sent to her pharmacy to treat both bacterial vaginosis (BV) and trichomoniasis. The patient verbalized understanding and is aware of the plan of care.

## 2023-04-13 ENCOUNTER — Ambulatory Visit (INDEPENDENT_AMBULATORY_CARE_PROVIDER_SITE_OTHER): Payer: BC Managed Care – PPO | Admitting: Licensed Clinical Social Worker

## 2023-04-13 DIAGNOSIS — F4323 Adjustment disorder with mixed anxiety and depressed mood: Secondary | ICD-10-CM

## 2023-04-13 NOTE — BH Specialist Note (Signed)
 Integrated Behavioral Health via Telemedicine Visit  04/18/2023 Meagan Oneill [id number]  Number of Integrated Behavioral Health Clinician visits: 1- Initial Visit  Session Start time: 1630   Session End time: 1712  Total time in minutes: 42   Referring Provider: Gloria Zarwolo Patient/Family location: At [facility] Provider location: Remote Office All persons participating in visit: Patient and [facility] Types of Service: Individual psychotherapy and Video visit  I connected with Meagan Oneill and/or Meagan Oneill's patient via  Psychologist, Clinical  (Video is Surveyor, mining) and verified that I am speaking with the correct person using two identifiers. Discussed confidentiality: Yes   I discussed the limitations of telemedicine and the availability of in person appointments.  Discussed there is a possibility of technology failure and discussed alternative modes of communication if that failure occurs.  I discussed that engaging in this telemedicine visit, they consent to the provision of behavioral healthcare and the services will be billed under their insurance.  Patient and/or legal guardian expressed understanding and consented to Telemedicine visit: Yes   Presenting Concerns: Patient and/or family reports the following symptoms/concerns: Increased stressors, depression and anxiety Duration of problem: Months; Severity of problem: moderate  Patient and/or Family's Strengths/Protective Factors: Concrete supports in place (healthy food, safe environments, etc.), Sense of purpose, and Physical Health (exercise, healthy diet, medication compliance, etc.)  Goals Addressed: Patient will:  Reduce symptoms of: anxiety and depression   Increase knowledge and/or ability of: coping skills, healthy habits, and self-management skills   Demonstrate ability to: Increase healthy adjustment to current life circumstances  Progress towards  Goals: Ongoing  Interventions: Interventions utilized:  Mindfulness or Management Consultant, Supportive Counseling, Psychoeducation and/or Health Education, Communication Skills, and Supportive Reflection Standardized Assessments completed: Not Needed  Patient and/or Family Response: Patient attended the virtual session and 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 She discussed a recent breakup with her ex-boyfriend in November, which stemmed from disagreements about his children. The patient described experiencing fluctuating mood states, with some days feeling empowered and capable, while other days are marked by sadness, anger, and isolation. The patient reported a recent diagnosis of ADHD, anxiety, and depression. She shared that she is prescribed hydroxyzine , which she takes at bedtime as needed and finds effective. She was also prescribed Strattera  to manage ADHD symptoms. The patient noted she has been focusing on developing short-term and long-term goals and keeping herself busy to cope with her challenges. The patient was provided psychoeducation on the phases of relationship breakups and the associated emotional responses. Strategies for emotional regulation and coping were explored collaboratively to support her in navigating these challenges.   Assessment: This patient is experiencing emotional distress and mood fluctuations following a recent breakup, marked by feelings of sadness, anger, and isolation. She is also navigating the challenges of newly diagnosed ADHD, anxiety, and depression, while actively working on coping strategies and personal goals to manage her symptoms and emotional well-being.   Patient may benefit from continued support from integrated behavioral health to support healthy adjustment.  Plan: Follow up with behavioral health clinician on : 04/27/2023 Behavioral recommendations: Meagan Oneill will continue taking prescribed medications. Take things one day at a  time. Work on long term and short term goals. Utilize your trusted supports to process feelings/emotions surrounding recent breakup. Additionally, try journaling and relaxation strategies.  Referral(s): Integrated [employer] (In Clinic)  I discussed the assessment and treatment plan with the patient and/or parent/guardian. They were provided an opportunity  to ask questions and all were answered. They agreed with the plan and demonstrated an understanding of the instructions.   They were advised to call back or seek an in-person evaluation if the symptoms worsen or if the condition fails to improve as anticipated.  Meagan Oneill, LCSWA

## 2023-04-16 ENCOUNTER — Encounter: Payer: Self-pay | Admitting: Family Medicine

## 2023-04-24 ENCOUNTER — Other Ambulatory Visit: Payer: Self-pay | Admitting: Family Medicine

## 2023-04-24 DIAGNOSIS — N760 Acute vaginitis: Secondary | ICD-10-CM

## 2023-04-27 ENCOUNTER — Ambulatory Visit (INDEPENDENT_AMBULATORY_CARE_PROVIDER_SITE_OTHER): Payer: BC Managed Care – PPO | Admitting: Licensed Clinical Social Worker

## 2023-04-27 DIAGNOSIS — F4323 Adjustment disorder with mixed anxiety and depressed mood: Secondary | ICD-10-CM | POA: Diagnosis not present

## 2023-04-27 NOTE — BH Specialist Note (Signed)
Integrated Behavioral Health via Telemedicine Visit  05/05/2023 Meagan Oneill [id number]  Number of Integrated Behavioral Health Clinician visits: 2- Second Visit  Session Start time: 1700   Session End time: 1741  Total time in minutes: 41   Referring Provider: Gilmore Laroche Patient/Family location: At [facility] Provider location: Remote Office All persons participating in visit: Patient and [facility] Types of Service: Individual psychotherapy and Video visit  I connected with Meagan Oneill and/or Meagan Oneill's patient via  Psychologist, clinical  (Video is Surveyor, mining) and verified that I am speaking with the correct person using two identifiers. Discussed confidentiality: Yes   I discussed the limitations of telemedicine and the availability of in person appointments.  Discussed there is a possibility of technology failure and discussed alternative modes of communication if that failure occurs.  I discussed that engaging in this telemedicine visit, they consent to the provision of behavioral healthcare and the services will be billed under their insurance.  Patient and/or legal guardian expressed understanding and consented to Telemedicine visit: Yes   Presenting Concerns: Patient and/or family reports the following symptoms/concerns: difficulty with emotional regulation, ongoing stressors.  Duration of problem: Months; Severity of problem: moderate  Patient and/or Family's Strengths/Protective Factors: Social and Emotional competence, Concrete supports in place (healthy food, safe environments, etc.), and Sense of purpose  Goals Addressed: Patient will:  Reduce symptoms of: anxiety and depression   Increase knowledge and/or ability of: coping skills, healthy habits, and self-management skills   Demonstrate ability to: Increase healthy adjustment to current life circumstances  Progress towards Goals: Ongoing  Interventions: Interventions  utilized:  Solution-Focused Strategies, Mindfulness or Management consultant, Supportive Counseling, Psychoeducation and/or Health Education, and Supportive Reflection Standardized Assessments completed: Not Needed  Patient and/or Family Response:The patient was present for today's virtual session. She recapped the previous session, expressing that she found it to be very beneficial. 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 She reported feeling irritable and "snappy," even toward colleagues at work who had done nothing to upset her. The patient engaged in a therapeutic discussion about emotional dysregulation and its impact on the nervous system. She explored the use of the "stop" technique to help pause her emotional reactions and identified strategies to better manage her emotions moving forward.  Assessment: Patient currently experiencing excitement and accomplishment regarding her upcoming start in nursing school and her success in passing the entrance exam. However, she is also struggling with mood dysregulation, feeling low and irritable following a recent argument with her mother, which impacted her interactions at work..   Patient may benefit from continued support from integrated behavioral health to support healthy adjustment.  Plan: Follow up with behavioral health clinician on : 05/11/23 Behavioral recommendations: Remember to set healthy boundaries with your mother to reduce emotional triggers. Try using the "stop" technique. S tells you to Stop, interrupting the thoughts you're having. T prompts you to Take a few deep breaths. O reminds you to Observe what's happening in your body, mind, and emotions. P allows  you to Proceed with how you've decided to react.  Referral(s): Integrated [employer] (In Clinic)  I discussed the assessment and treatment plan with the patient and/or parent/guardian. They were provided an opportunity to ask questions and all  were answered. They agreed with the plan and demonstrated an understanding of the instructions.   They were advised to call back or seek an in-person evaluation if the symptoms worsen or if the condition fails to improve as anticipated.  Meagan Oneill, LCSWA

## 2023-04-30 ENCOUNTER — Ambulatory Visit (HOSPITAL_COMMUNITY)
Admission: RE | Admit: 2023-04-30 | Discharge: 2023-04-30 | Disposition: A | Discharge status: Home / Self Care | Payer: BC Managed Care – PPO | Source: Ambulatory Visit | Attending: Family Medicine | Admitting: Family Medicine

## 2023-04-30 DIAGNOSIS — Z01818 Encounter for other preprocedural examination: Secondary | ICD-10-CM | POA: Diagnosis present

## 2023-05-01 ENCOUNTER — Other Ambulatory Visit: Payer: Self-pay | Admitting: Family Medicine

## 2023-05-01 ENCOUNTER — Encounter: Payer: Self-pay | Admitting: Family Medicine

## 2023-05-01 DIAGNOSIS — B9689 Other specified bacterial agents as the cause of diseases classified elsewhere: Secondary | ICD-10-CM

## 2023-05-01 DIAGNOSIS — E559 Vitamin D deficiency, unspecified: Secondary | ICD-10-CM

## 2023-05-01 DIAGNOSIS — A599 Trichomoniasis, unspecified: Secondary | ICD-10-CM

## 2023-05-01 LAB — CBC WITH DIFFERENTIAL/PLATELET
Basophils Absolute: 0.1 10*3/uL (ref 0.0–0.2)
Basos: 1 %
EOS (ABSOLUTE): 0.2 10*3/uL (ref 0.0–0.4)
Eos: 3 %
Hematocrit: 37.9 % (ref 34.0–46.6)
Hemoglobin: 11.3 g/dL (ref 11.1–15.9)
Immature Grans (Abs): 0 10*3/uL (ref 0.0–0.1)
Immature Granulocytes: 0 %
Lymphocytes Absolute: 2.7 10*3/uL (ref 0.7–3.1)
Lymphs: 34 %
MCH: 20.8 pg — ABNORMAL LOW (ref 26.6–33.0)
MCHC: 29.8 g/dL — ABNORMAL LOW (ref 31.5–35.7)
MCV: 70 fL — ABNORMAL LOW (ref 79–97)
Monocytes Absolute: 0.4 10*3/uL (ref 0.1–0.9)
Monocytes: 5 %
Neutrophils Absolute: 4.6 10*3/uL (ref 1.4–7.0)
Neutrophils: 57 %
Platelets: 205 10*3/uL (ref 150–450)
RBC: 5.42 x10E6/uL — ABNORMAL HIGH (ref 3.77–5.28)
RDW: 21.2 % — ABNORMAL HIGH (ref 11.7–15.4)
WBC: 8 10*3/uL (ref 3.4–10.8)

## 2023-05-01 LAB — HEMOGLOBIN A1C
Est. average glucose Bld gHb Est-mCnc: 105 mg/dL
Hgb A1c MFr Bld: 5.3 % (ref 4.8–5.6)

## 2023-05-01 LAB — LIPID PANEL
Chol/HDL Ratio: 3.1 {ratio} (ref 0.0–4.4)
Cholesterol, Total: 140 mg/dL (ref 100–199)
HDL: 45 mg/dL (ref >39)
LDL Chol Calc (NIH): 76 mg/dL (ref 0–99)
Triglycerides: 101 mg/dL (ref 0–149)
VLDL Cholesterol Cal: 19 mg/dL (ref 5–40)

## 2023-05-01 LAB — CMP14+EGFR
ALT: 15 [IU]/L (ref 0–32)
AST: 16 [IU]/L (ref 0–40)
Albumin: 3.8 g/dL — ABNORMAL LOW (ref 3.9–4.9)
Alkaline Phosphatase: 92 [IU]/L (ref 44–121)
BUN/Creatinine Ratio: 12 (ref 9–23)
BUN: 9 mg/dL (ref 6–20)
Bilirubin Total: 0.7 mg/dL (ref 0.0–1.2)
CO2: 24 mmol/L (ref 20–29)
Calcium: 9.4 mg/dL (ref 8.7–10.2)
Chloride: 103 mmol/L (ref 96–106)
Creatinine, Ser: 0.77 mg/dL (ref 0.57–1.00)
Globulin, Total: 3.4 g/dL (ref 1.5–4.5)
Glucose: 105 mg/dL — ABNORMAL HIGH (ref 70–99)
Potassium: 4.2 mmol/L (ref 3.5–5.2)
Sodium: 142 mmol/L (ref 134–144)
Total Protein: 7.2 g/dL (ref 6.0–8.5)
eGFR: 104 mL/min/{1.73_m2} (ref >59)

## 2023-05-01 LAB — VITAMIN D 25 HYDROXY (VIT D DEFICIENCY, FRACTURES): Vit D, 25-Hydroxy: 11.6 ng/mL — ABNORMAL LOW (ref 30.0–100.0)

## 2023-05-01 LAB — HIV ANTIBODY (ROUTINE TESTING W REFLEX): HIV Screen 4th Generation wRfx: NONREACTIVE

## 2023-05-01 LAB — HEPATITIS C ANTIBODY: Hep C Virus Ab: NONREACTIVE

## 2023-05-01 LAB — TSH+FREE T4
Free T4: 1.43 ng/dL (ref 0.82–1.77)
TSH: 2.31 u[IU]/mL (ref 0.450–4.500)

## 2023-05-01 MED ORDER — VITAMIN D (ERGOCALCIFEROL) 1.25 MG (50000 UNIT) PO CAPS: 50000.0000 [IU] | ORAL_CAPSULE | ORAL | 1 refills | Status: AC

## 2023-05-01 MED ORDER — VITAMIN D (ERGOCALCIFEROL) 1.25 MG (50000 UNIT) PO CAPS: 50000.0000 [IU] | ORAL_CAPSULE | ORAL | 1 refills | Status: DC

## 2023-05-01 MED ORDER — METRONIDAZOLE 500 MG PO TABS: 500.0000 mg | ORAL_TABLET | Freq: Two times a day (BID) | ORAL | 0 refills | Status: AC

## 2023-05-11 ENCOUNTER — Ambulatory Visit: Payer: BC Managed Care – PPO | Admitting: Licensed Clinical Social Worker

## 2023-05-11 ENCOUNTER — Ambulatory Visit: Payer: BC Managed Care – PPO | Admitting: Family Medicine

## 2023-05-11 VITALS — BP 119/79 | HR 114 | Temp 99.9°F | Resp 16 | Ht 63.0 in | Wt 382.0 lb

## 2023-05-11 DIAGNOSIS — F4323 Adjustment disorder with mixed anxiety and depressed mood: Secondary | ICD-10-CM

## 2023-05-11 DIAGNOSIS — B349 Viral infection, unspecified: Secondary | ICD-10-CM | POA: Insufficient documentation

## 2023-05-11 MED ORDER — GUAIFENESIN 100 MG/5ML PO LIQD
5.0000 mL | ORAL | 0 refills | Status: AC | PRN
Start: 2023-05-11 — End: ?

## 2023-05-11 MED ORDER — FLUTICASONE PROPIONATE 50 MCG/ACT NA SUSP: 2.0000 | Freq: Every day | NASAL | 1 refills | Status: AC

## 2023-05-11 MED ORDER — NOREL AD 4-10-325 MG PO TABS: ORAL_TABLET | ORAL | 0 refills | Status: AC

## 2023-05-11 NOTE — Progress Notes (Signed)
Acute Office Visit  Subjective:    Patient ID: Meagan Oneill, female    DOB: [birth year], 33 y.o.   MRN: [id number]  Chief Complaint  Patient presents with   Cough    Coughing, sinus congestion, sneezing, headaches, low grade fever, chills x 3 days. Has used mucinex DM and vitamin C and zinc    HPI The patient is in today with complaints of nasal congestion, cough, headaches, low-grade fever, and chills for the past three days. 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 She has been taking over-the-counter Mucinex DM, vitamin D, and zinc for symptom relief.   Past Medical History:  Diagnosis Date   Anemia    Asthma    no inhaler   Environmental allergies    Hypertension     Past Surgical History:  Procedure Laterality Date   I & D EXTREMITY Left 05/13/2021   Procedure: DEBRIDEMENT LEFT KNEE;  Surgeon: Nadara Mustard, MD;  Location: [facility] OR;  Service: Orthopedics;  Laterality: Left;   KNEE ARTHROSCOPY WITH ANTERIOR CRUCIATE LIGAMENT (ACL) REPAIR WITH HAMSTRING GRAFT Left 03/25/2021   Procedure: left knee anterior cruciate ligament, posterior cruciate ligment, posterolateral corner reconstruction arthroscopy; allograft ligaments for reconstruction, meniscal debridement vs repair;  Surgeon: Cammy Copa, MD;  Location: [facility] OR;  Service: Orthopedics;  Laterality: Left;   WISDOM TOOTH EXTRACTION      Family History  Problem Relation Age of Onset   Hypertension Mother    Diabetes Mother    Hypertension Sister     Social History   Socioeconomic History   Marital status: Single    Spouse name: Not on file   Number of children: Not on file   Years of education: Not on file   Highest education level: Not on file  Occupational History   Not on file  Tobacco Use   Smoking status: Never   Smokeless tobacco: Never  Vaping Use   Vaping status: Never Used  Substance and  Sexual Activity   Alcohol use: Not Currently    Alcohol/week: 1.0 standard drink of alcohol    Types: 1 Glasses of wine per week    Comment: occas.   Drug use: No   Sexual activity: Yes    Birth control/protection: None  Other Topics Concern   Not on file  Social History Narrative   Not on file   Social Drivers of Health   Financial Resource Strain: Not on file  Food Insecurity: Not on file  Transportation Needs: Not on file  Physical Activity: Not on file  Stress: Not on file  Social Connections: Unknown (08/09/2021)   Received from [facility]   Social Network    Social Network: Not on file  Intimate Partner Violence: Unknown (07/15/2021)   Received from [facility]   HITS    Physically Hurt: Not on file    Insult or Talk Down To: Not on file    Threaten Physical Harm: Not on file    Scream or Curse: Not on file    Outpatient Medications Prior to Visit  Medication Sig Dispense Refill   hydrOXYzine (VISTARIL) 25 MG capsule Take 1 capsule (25 mg total) by mouth every 8 (eight) hours as needed. 30 capsule 1   Vitamin D, Ergocalciferol, (DRISDOL) 1.25 MG (50000 UNIT) CAPS capsule Take 1 capsule (50,000 Units total) by mouth every 7 (seven) days. 20 capsule  1   No facility-administered medications prior to visit.    Allergies  Allergen Reactions   Morphine And Codeine Hives    04/07/2021- Patient is currently tolerating tramadol and oxycodone without reaction.     Review of Systems  Constitutional:  Negative for chills.  HENT:  Positive for congestion and postnasal drip. Negative for sinus pressure, sinus pain and sore throat.   Eyes:  Negative for visual disturbance.  Respiratory:  Positive for cough. Negative for chest tightness and shortness of breath.   Neurological:  Negative for dizziness and headaches.       Objective:    Physical Exam HENT:     Head: Normocephalic.     Mouth/Throat:     Mouth: Mucous membranes are moist.      Tongue: No lesions.     Pharynx: No pharyngeal swelling or oropharyngeal exudate.     Tonsils: No tonsillar exudate.  Cardiovascular:     Rate and Rhythm: Normal rate.     Heart sounds: Normal heart sounds.  Pulmonary:     Effort: Pulmonary effort is normal.     Breath sounds: Normal breath sounds.  Neurological:     Mental Status: She is alert.     BP 119/79   Pulse (!) 114   Temp 99.9 F (37.7 C) (Oral)   Resp 16   Ht 5\' 3"  (1.6 m)   Wt (!) 382 lb (173.3 kg)   SpO2 94%   BMI 67.67 kg/m  Wt Readings from Last 3 Encounters:  05/11/23 (!) 382 lb (173.3 kg)  03/27/23 (!) 385 lb (174.6 kg)  10/15/21 (!) 378 lb (171.5 kg)       Assessment & Plan:  Viral illness Assessment & Plan: Encouraged to start taking Norel AD: 1 tablet every 4 hours, but do not exceed 6 tablets in 24 hours. Do not take Tylenol or Hydroxyzine while using Norel AD, as this medication already contains both. Flonase nasal spray: Spray 2 sprays in both nostrils daily for nasal congestion. Robitussin: Take 5 mL every 4 hours for cough. Supportive Care Increase fluid intake and allow for plenty of rest. Use a humidifier at bedtime to help with cough and nasal congestion. For nasal congestion: Heated humidified air is a safe and effective therapy. Saline nasal sprays may also help alleviate symptoms. For cough: Honey may help reduce cough frequency and severity. Follow-Up If symptoms do not improve within 7 to 10 days, please follow up for further evaluation.  Orders: -     COVID-19, Flu A+B and RSV -     Norel AD; Take 1 tablet every 4 hours while symptoms persists. Do not take more than 6 tablets in 24 hours.  Dispense: 20 tablet; Refill: 0 -     guaiFENesin; Take 5 mLs by mouth every 4 (four) hours as needed for cough or to loosen phlegm.  Dispense: 120 mL; Refill: 0 -     Fluticasone Propionate; Place 2 sprays into both nostrils daily.  Dispense: 16 g; Refill: 1  Note: This chart has been  completed using Engineer, civil (consulting) software, and while attempts have been made to ensure accuracy, certain words and phrases may not be transcribed as intended.    Gilmore Laroche, FNP

## 2023-05-11 NOTE — BH Specialist Note (Unsigned)
Integrated Behavioral Health via Telemedicine Visit  05/11/2023 [place] Orris [id number]  Number of Integrated Behavioral Health Clinician visits: 2- Second Visit  Session Start time: 1700   Session End time: 1741  Total time in minutes: 41   Referring Provider: *** Patient/Family location: [facility] Provider location: *** All persons participating in visit: *** Types of Service: {CHL AMB TYPE OF [phone]}  I connected with [place] Rafalski and/or [place] Hage's {family members:20773} via  Telephone or Engineer, civil (consulting)  (Video is Surveyor, mining) and verified that I am speaking with the correct person using two identifiers. Discussed confidentiality: {YES/NO:21197}  I discussed the limitations of telemedicine and the availability of in person appointments.  Discussed there is a possibility of technology failure and discussed alternative modes of communication if that failure occurs.  I discussed that engaging in this telemedicine visit, they consent to the provision of behavioral healthcare and the services will be billed under their insurance.  Patient and/or legal guardian expressed understanding and consented to Telemedicine visit: {YES/NO:21197}  Presenting Concerns: Patient and/or family reports the following symptoms/concerns: *** Duration of problem: ***; Severity of problem: {Mild/Moderate/Severe:20260}  Patient and/or Family's Strengths/Protective Factors: {CHL AMB BH PROTECTIVE FACTORS:[id number]}  Goals Addressed: Patient will:  Reduce symptoms of: {IBH Symptoms:21014056}   Increase knowledge and/or ability of: {IBH Patient Tools:21014057}   Demonstrate ability to: {IBH Goals:21014053}  Progress towards Goals: {CHL AMB BH PROGRESS TOWARDS GOALS:[phone]}  Interventions: Interventions utilized:  {IBH Interventions:21014054} Standardized Assessments completed: {IBH Screening Tools:21014051}  Patient and/or Family Response:  She's been sick.. Lost her taste and smell today.. Getting sick on Monday. Went to get tested for Covid, RSV and Flu.. Waiting on results. Ready for school to start back to stay busy..   Met a friend on facebook dating.. Female who also lives in [place].. New to the area.Elnoria Howard out for the first time 2 weekends ago.   Assessment: Patient currently experiencing ***.   Patient may benefit from ***.  Plan: Follow up with behavioral health clinician on : *** Behavioral recommendations: *** Referral(s): {IBH Referrals:21014055}  I discussed the assessment and treatment plan with the patient and/or parent/guardian. They were provided an opportunity to ask questions and all were answered. They agreed with the plan and demonstrated an understanding of the instructions.   They were advised to call back or seek an in-person evaluation if the symptoms worsen or if the condition fails to improve as anticipated.  Adamariz Gillott Cruzita Lederer, LCSWA

## 2023-05-11 NOTE — Assessment & Plan Note (Addendum)
Encouraged to start taking Norel AD: 1 tablet every 4 hours, but do not exceed 6 tablets in 24 hours. Do not take Tylenol or Hydroxyzine while using Norel AD, as this medication already contains both. Flonase nasal spray: Spray 2 sprays in both nostrils daily for nasal congestion. Robitussin: Take 5 mL every 4 hours for cough. Supportive Care Increase fluid intake and allow for plenty of rest. Use a humidifier at bedtime to help with cough and nasal congestion. For nasal congestion: Heated humidified air is a safe and effective therapy. Saline nasal sprays may also help alleviate symptoms. For cough: Honey may help reduce cough frequency and severity. Follow-Up If symptoms do not improve within 7 to 10 days, please follow up for further evaluation.

## 2023-05-11 NOTE — Patient Instructions (Addendum)
I appreciate the opportunity to provide care to you today!  Viral Illness:  Start taking Norel AD: 1 tablet every 4 hours, but do not exceed 6 tablets in 24 hours. Do not take Tylenol or Hydroxyzine while using Norel AD, as this medication already contains both. Flonase nasal spray: Spray 2 sprays in both nostrils daily for nasal congestion. Robitussin: Take 5 mL every 4 hours for cough. Supportive Care Increase fluid intake and allow for plenty of rest. Use a humidifier at bedtime to help with cough and nasal congestion. For nasal congestion: Heated humidified air is a safe and effective therapy. Saline nasal sprays may also help alleviate symptoms. For cough: Honey may help reduce cough frequency and severity. Follow-Up If symptoms do not improve within 7 to 10 days, please follow up for further evaluation.   Please continue to a heart-healthy diet and increase your physical activities. Try to exercise for at least five days a week.    It was a pleasure to see you and I look forward to continuing to work together on your health and well-being. Please do not hesitate to call the office if you need care or have questions about your care.  In case of emergency, please visit the Emergency Department for urgent care, or contact our clinic at [phone] to schedule an appointment. We're here to help you!   Have a wonderful day and week. With Gratitude, Gilmore Laroche MSN, FNP-BC

## 2023-05-14 ENCOUNTER — Other Ambulatory Visit: Payer: Self-pay | Admitting: Family Medicine

## 2023-05-14 ENCOUNTER — Encounter: Payer: Self-pay | Admitting: Family Medicine

## 2023-05-14 DIAGNOSIS — J101 Influenza due to other identified influenza virus with other respiratory manifestations: Secondary | ICD-10-CM

## 2023-05-14 LAB — COVID-19, FLU A+B AND RSV
Influenza A, NAA: DETECTED — AB
Influenza B, NAA: NOT DETECTED
RSV, NAA: NOT DETECTED
SARS-CoV-2, NAA: NOT DETECTED

## 2023-05-14 MED ORDER — OSELTAMIVIR PHOSPHATE 75 MG PO CAPS: 75.0000 mg | ORAL_CAPSULE | Freq: Two times a day (BID) | ORAL | 0 refills | Status: AC

## 2023-05-25 ENCOUNTER — Encounter: Payer: BC Managed Care – PPO | Admitting: Licensed Clinical Social Worker

## 2023-06-13 NOTE — Telephone Encounter (Unsigned)
 Copied from CRM [id number]. Topic: Clinical - Lab/Test Results >> Jun 12, 2023  2:03 PM Maree Krabbe H wrote: Reason for CRM: Shon Hale is calling from [facility] and she is needing a EKG Tracing which was done on 03/27/2023, she can be called back at [phone] choice #1 lets you leave a voicemail if she does not pick up.

## 2023-06-14 NOTE — Telephone Encounter (Signed)
 Called and left message for for number to send the EKG

## 2023-08-15 IMAGING — CT CT ANGIO AOBIFEM WO/W CM
2 of 5 series · 12 of 46 positions shown, 14 images · IV contrast (Omnipaque or Isovue)
Comparison: Left knee radiograph 02/16/2021

CT angiography left lower extremity 02/15/2021

CLINICAL DATA: Left knee pain after fall. [REDACTED] called out to assist
up after fall. Was able to ambulate with crutches. Pt req xray of
left knee to ensure no further injury to knee. ([REDACTED] pt dislocated
knee from previous fall).

EXAM:
CT ANGIOGRAPHY OF ABDOMINAL AORTA WITH ILIOFEMORAL RUNOFF
TECHNIQUE: Multidetector CT imaging of the abdomen, pelvis and lower
extremities was performed using the standard protocol during bolus
administration of intravenous contrast. Multiplanar CT image
reconstructions and MIPs were obtained to evaluate the vascular
anatomy.
CONTRAST:  125mL OMNIPAQUE IOHEXOL 350 MG/ML SOLN

[Series 6: arterial · axial · arterial · 0.98mm/px · z∈[+264,+1536]mm · 9 of 488 slices shown, 11 images]
[im 32/488  soft-tissue]
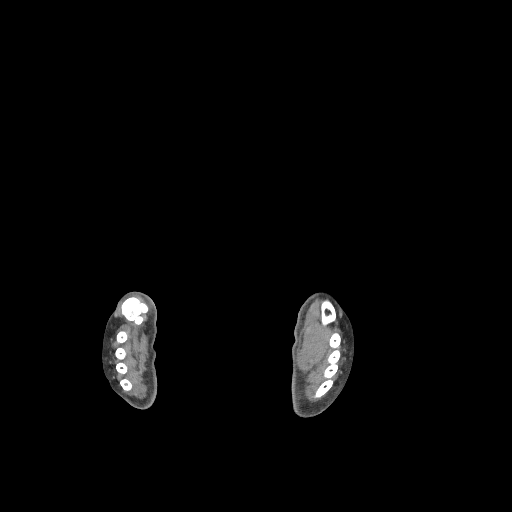
[im 32/488  bone]
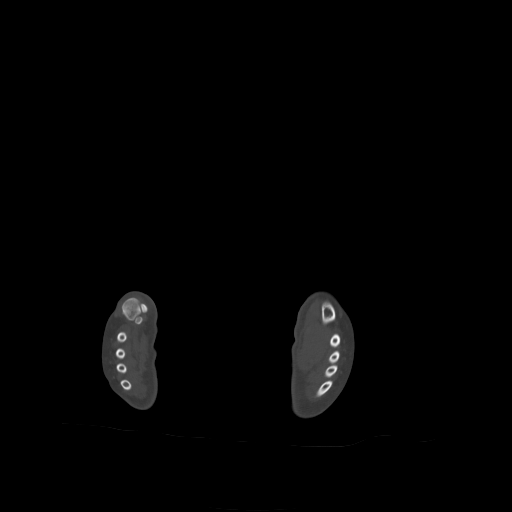
[im 95/488  soft-tissue]
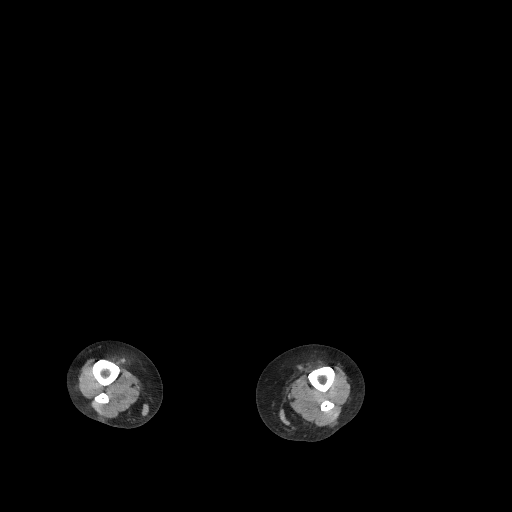
[im 142/488  soft-tissue]
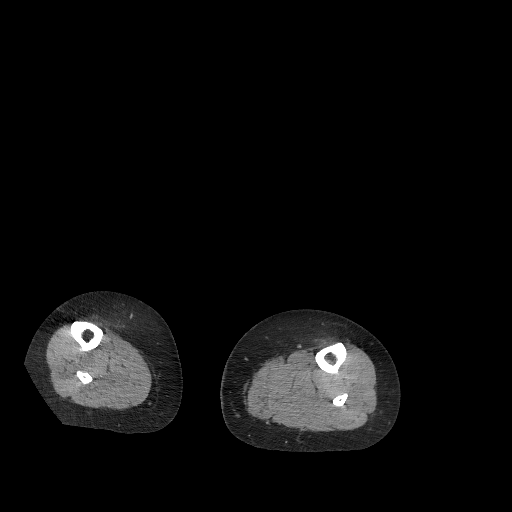
[im 189/488  soft-tissue]
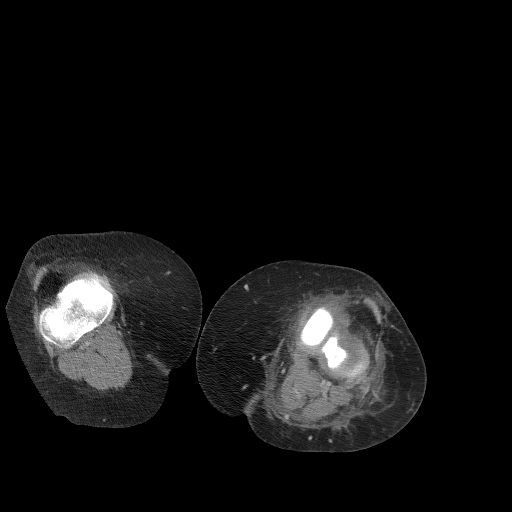
[im 252/488  soft-tissue]
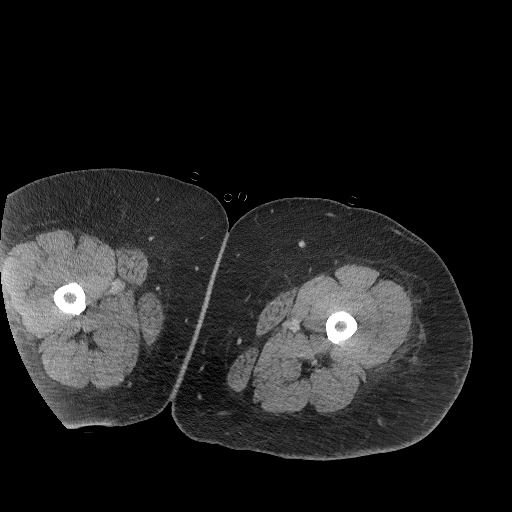
[im 299/488  soft-tissue]
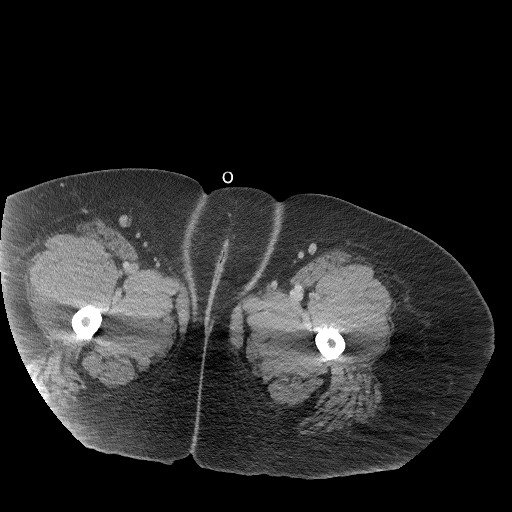
[im 346/488  soft-tissue]
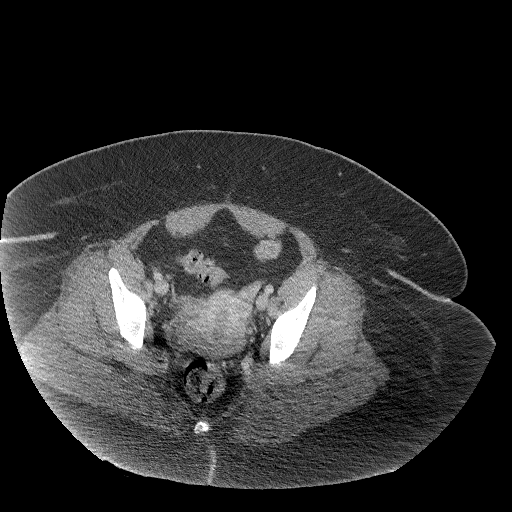
[im 409/488  soft-tissue]
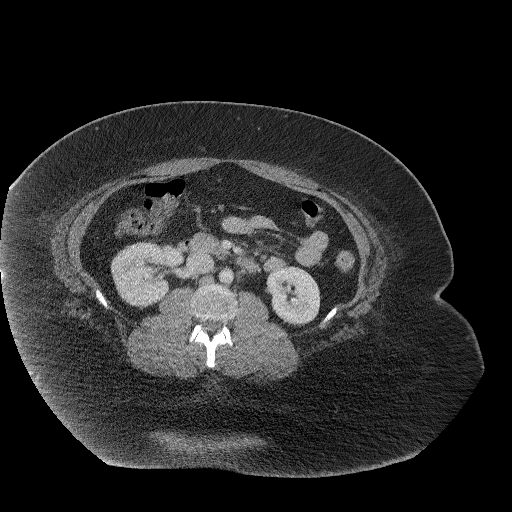
[im 456/488  soft-tissue]
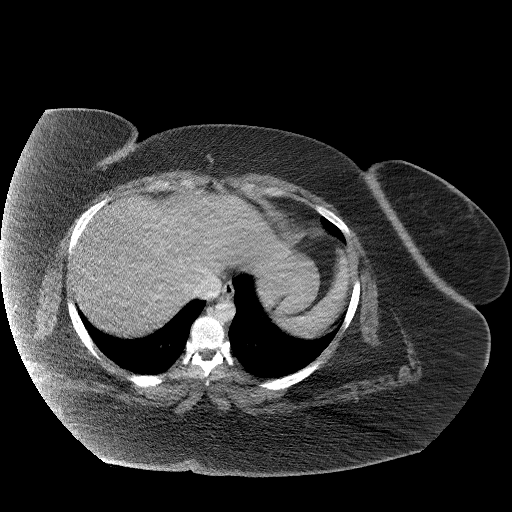
[im 456/488  bone]
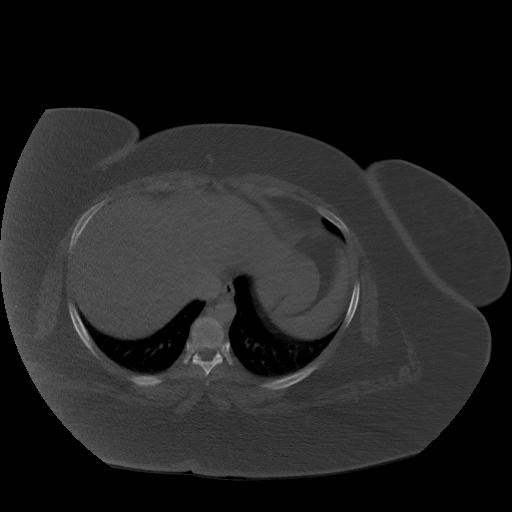

[Series 8: ap cor soft · coronal · 1.15mm/px · 3 of 221 slices shown]
[im 56/221  soft-tissue]
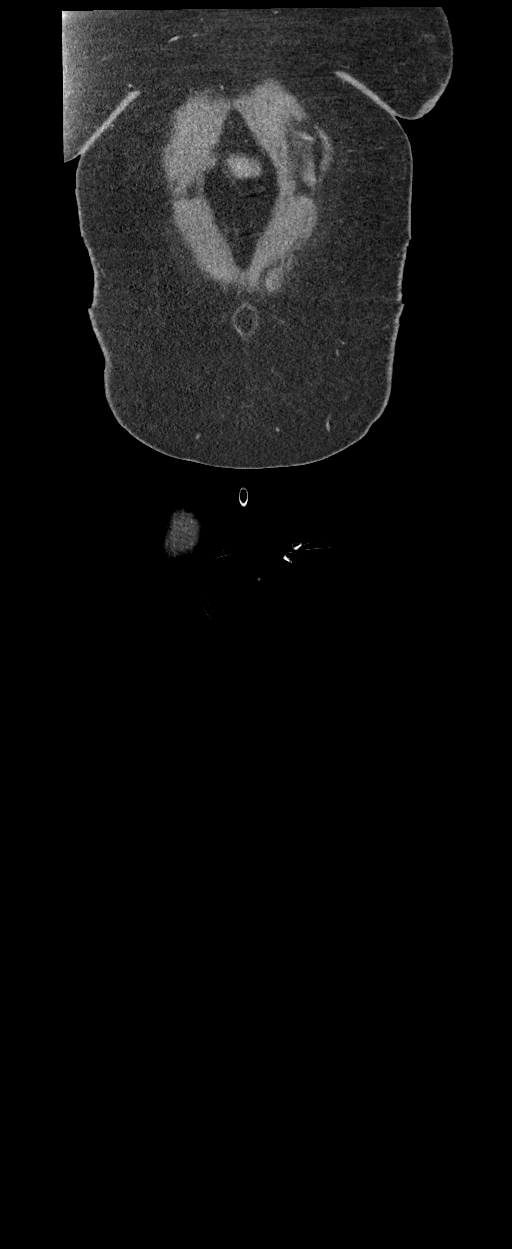
[im 111/221  soft-tissue]
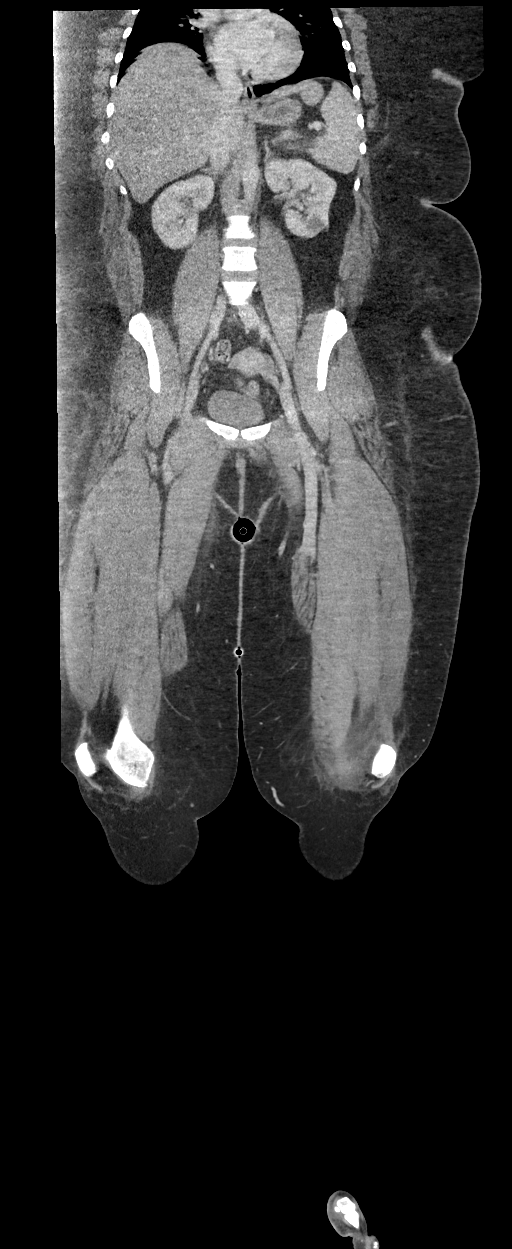
[im 166/221  soft-tissue]
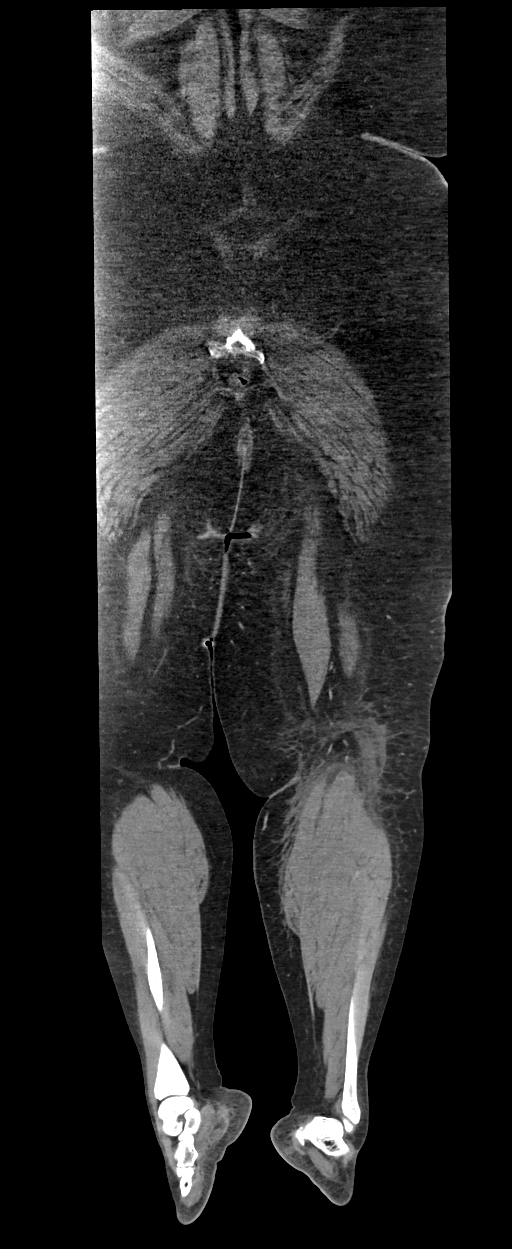

[12 of 46 positions shown; findings below may reference images not displayed]

FINDINGS: VASCULAR

Evaluation of vascular structures limited due to suboptimal
opacification.

Aorta: Normal caliber aorta without aneurysm, dissection, vasculitis
or significant stenosis.

Celiac: No significant abnormality.

SMA: No significant abnormality.

Renals: Not well visualized due to suboptimal opacification.

IMA: Patent.

RIGHT Lower Extremity

Inflow: No significant abnormality.

Outflow: No significant abnormality.

Runoff: Not well evaluated due to suboptimal opacification.

LEFT Lower Extremity

Inflow: No signal abnormality.

Outflow: No significant abnormality. Limited evaluation of the
popliteal artery due to suboptimal opacification.

Runoff: Not well evaluated due to suboptimal opacification.

Veins: No obvious venous abnormality within the limitations of this
arterial phase study.

Review of the MIP images confirms the above findings.

NON-VASCULAR

Lower chest: No acute abnormality.

Hepatobiliary: Diffuse hepatic steatosis. Liver, gallbladder common
bile duct otherwise unremarkable.

Pancreas: Normal.

Spleen: Mildly enlarged spleen measuring up to 15 cm in longest
dimension.

Adrenals/Urinary Tract: Adrenal glands are normal. 2.7 cm
low-density lesion at the lower pole the left kidney is most likely
simple cysts. Kidneys are otherwise unremarkable. No significant
abnormality of ureters or bladder.

Stomach/Bowel: Stomach is within normal limits. Appendix appears
normal. No evidence of bowel wall thickening, distention, or
inflammatory changes.

Lymphatic: No enlarged abdominal or pelvic lymph nodes.

Reproductive: Uterus and bilateral adnexa are unremarkable.

Other: Small fat containing umbilical hernia.

Musculoskeletal: Soft tissue injury of the left knee again seen. No
acute fracture.
IMPRESSION: VASCULAR

Evaluation of arterial structures limited due to suboptimal
opacification related to patient body habitus and timing of contrast
bolus. No definite injury of the popliteal artery is identified. If
there is continued clinical suspicion for popliteal artery injury,
further evaluation with ultrasound should be performed.

NON-VASCULAR

Soft tissue swelling of the left knee again noted.

## 2023-09-21 IMAGING — RF DG KNEE 1-2V*L*
1 series · 2 of 2 positions shown · non-contrast
Comparison: None.

CLINICAL DATA: Fluoroscopic assistance for left knee surgery

EXAM:
LEFT KNEE - 1-2 VIEW

[Series 1: run · 2 of 2 slices shown]
[im 1/2]
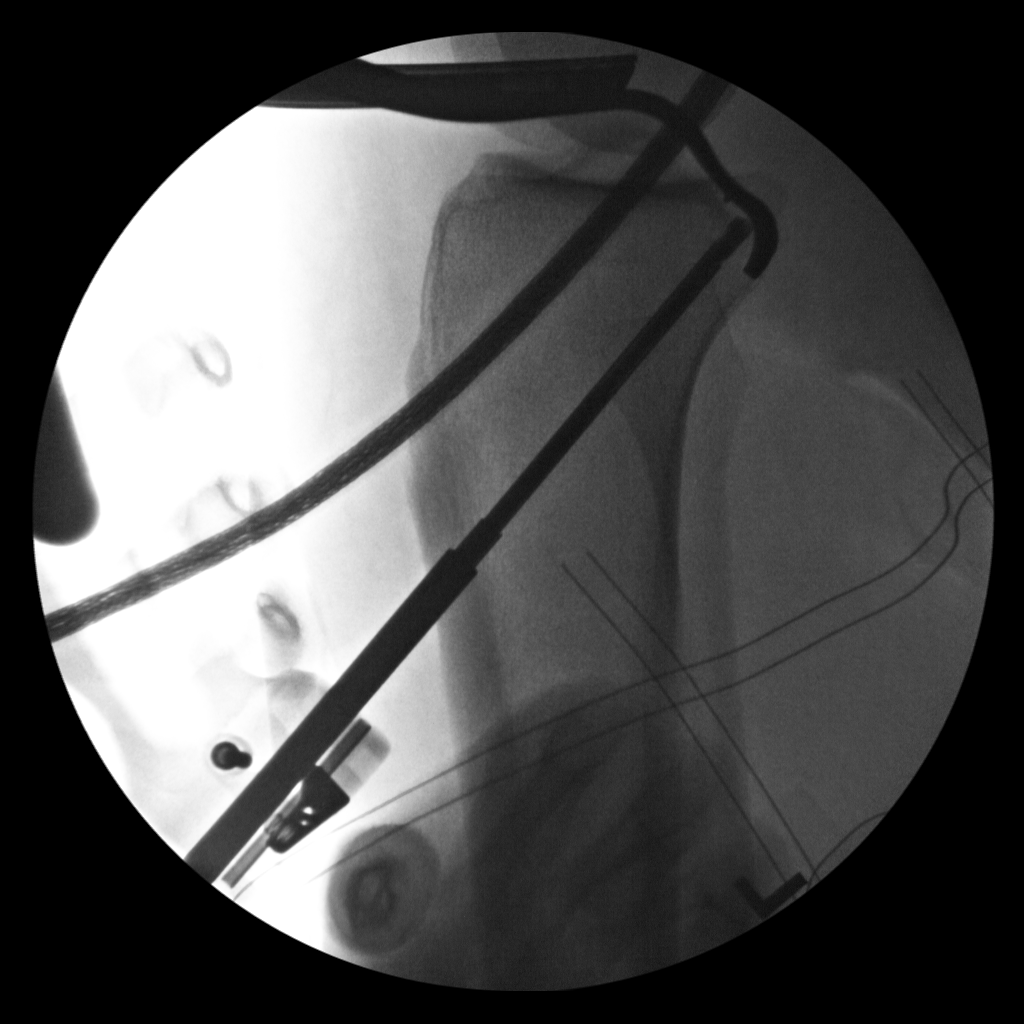
[im 2/2]
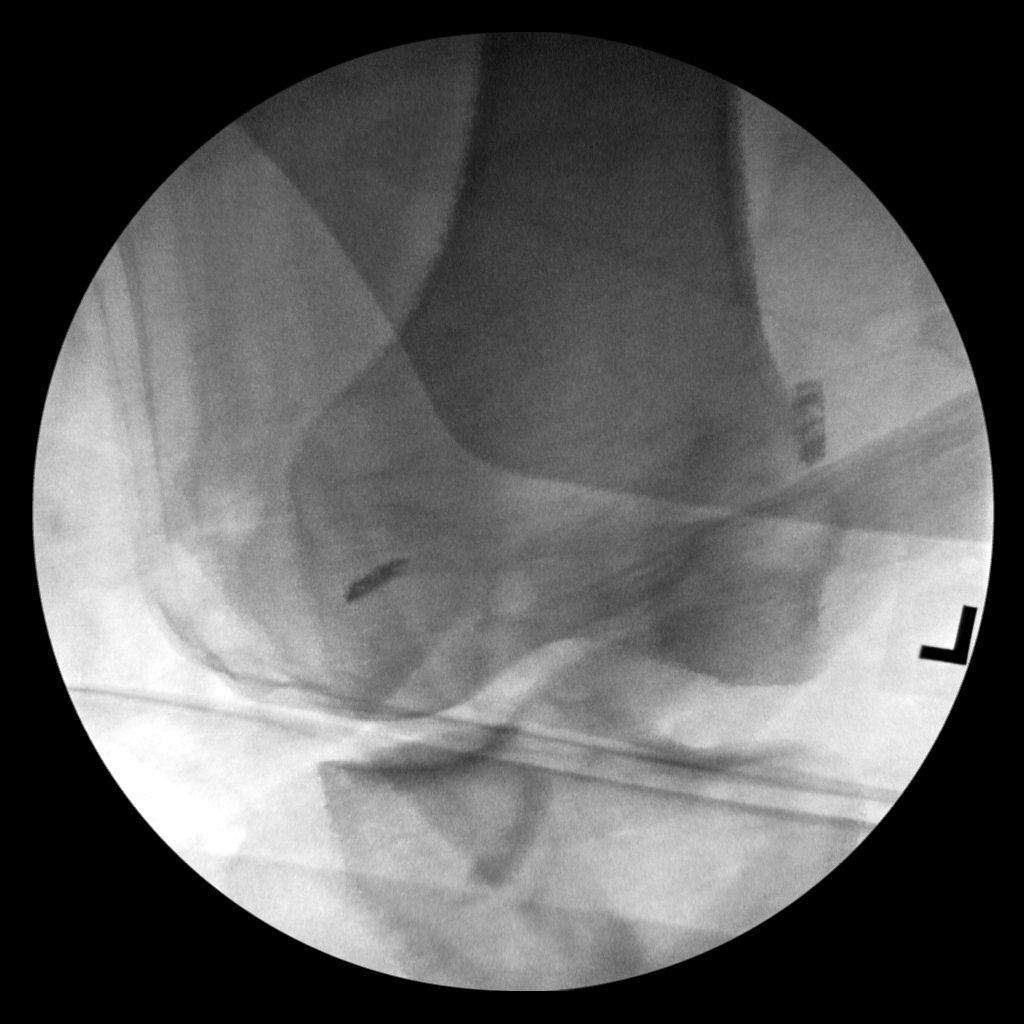

[2 of 2 positions shown; findings below may reference images not displayed]

FINDINGS: Fluoroscopic guidance was provided for left knee surgery.
Fluoroscopic time 17.7 seconds. Fluoroscopic dose 2.19 mGy.
IMPRESSION: Fluoroscopic assistance was provided for left knee surgery.

## 2023-09-26 ENCOUNTER — Emergency Department (HOSPITAL_COMMUNITY): Payer: Self-pay

## 2023-09-26 ENCOUNTER — Emergency Department (HOSPITAL_COMMUNITY)
Admission: EM | Admit: 2023-09-26 | Discharge: 2023-09-26 | Disposition: A | Discharge status: Home / Self Care | Payer: Self-pay | Attending: Emergency Medicine | Admitting: Emergency Medicine

## 2023-09-26 ENCOUNTER — Encounter (HOSPITAL_COMMUNITY): Payer: Self-pay

## 2023-09-26 ENCOUNTER — Other Ambulatory Visit: Payer: Self-pay

## 2023-09-26 DIAGNOSIS — N7093 Salpingitis and oophoritis, unspecified: Secondary | ICD-10-CM | POA: Insufficient documentation

## 2023-09-26 LAB — COMPREHENSIVE METABOLIC PANEL WITH GFR
ALT: 19 U/L (ref 0–44)
AST: 29 U/L (ref 15–41)
Albumin: 4.1 g/dL (ref 3.5–5.0)
Alkaline Phosphatase: 52 U/L (ref 38–126)
Anion gap: 11 (ref 5–15)
BUN: 8 mg/dL (ref 6–20)
CO2: 23 mmol/L (ref 22–32)
Calcium: 9.6 mg/dL (ref 8.9–10.3)
Chloride: 104 mmol/L (ref 98–111)
Creatinine, Ser: 0.75 mg/dL (ref 0.44–1.00)
GFR, Estimated: >60 mL/min (ref >60)
Glucose, Bld: 116 mg/dL — ABNORMAL HIGH (ref 70–99)
Potassium: 3.9 mmol/L (ref 3.5–5.1)
Sodium: 138 mmol/L (ref 135–145)
Total Bilirubin: 0.8 mg/dL (ref 0.0–1.2)
Total Protein: 8.7 g/dL — ABNORMAL HIGH (ref 6.5–8.1)

## 2023-09-26 LAB — CBC WITH DIFFERENTIAL/PLATELET
Abs Immature Granulocytes: 0.03 10*3/uL (ref 0.00–0.07)
Basophils Absolute: 0.1 10*3/uL (ref 0.0–0.1)
Basophils Relative: 1 %
Eosinophils Absolute: 0.3 10*3/uL (ref 0.0–0.5)
Eosinophils Relative: 3 %
HCT: 41.8 % (ref 36.0–46.0)
Hemoglobin: 13.2 g/dL (ref 12.0–15.0)
Immature Granulocytes: 0 %
Lymphocytes Relative: 37 %
Lymphs Abs: 4.2 10*3/uL — ABNORMAL HIGH (ref 0.7–4.0)
MCH: 21.5 pg — ABNORMAL LOW (ref 26.0–34.0)
MCHC: 31.6 g/dL (ref 30.0–36.0)
MCV: 68 fL — ABNORMAL LOW (ref 80.0–100.0)
Monocytes Absolute: 0.5 10*3/uL (ref 0.1–1.0)
Monocytes Relative: 4 %
Neutro Abs: 6.3 10*3/uL (ref 1.7–7.7)
Neutrophils Relative %: 55 %
Platelets: 239 10*3/uL (ref 150–400)
RBC: 6.15 MIL/uL — ABNORMAL HIGH (ref 3.87–5.11)
RDW: 21.8 % — ABNORMAL HIGH (ref 11.5–15.5)
WBC: 11.3 10*3/uL — ABNORMAL HIGH (ref 4.0–10.5)
nRBC: 0 % (ref 0.0–0.2)

## 2023-09-26 LAB — URINALYSIS, ROUTINE W REFLEX MICROSCOPIC
Bilirubin Urine: NEGATIVE
Glucose, UA: NEGATIVE mg/dL
Hgb urine dipstick: NEGATIVE
Ketones, ur: 5 mg/dL — AB
Leukocytes,Ua: NEGATIVE
Nitrite: NEGATIVE
Protein, ur: 30 mg/dL — AB
Specific Gravity, Urine: 1.03 (ref 1.005–1.030)
pH: 5 (ref 5.0–8.0)

## 2023-09-26 LAB — LIPASE, BLOOD: Lipase: 38 U/L (ref 11–51)

## 2023-09-26 LAB — HCG, QUANTITATIVE, PREGNANCY: hCG, Beta Chain, Quant, S: <1 m[IU]/mL (ref <5)

## 2023-09-26 MED ORDER — KETOROLAC TROMETHAMINE 30 MG/ML IJ SOLN
30.0000 mg | Freq: Once | INTRAMUSCULAR | Status: DC
  Filled 2023-09-26: qty 1

## 2023-09-26 MED ORDER — LIDOCAINE HCL (PF) 1 % IJ SOLN
INTRAMUSCULAR | Status: AC
  Administered 2023-09-26: 2.1 mL
  Filled 2023-09-26: qty 5

## 2023-09-26 MED ORDER — HYDROMORPHONE HCL 1 MG/ML IJ SOLN
1.0000 mg | Freq: Once | INTRAMUSCULAR | Status: AC
  Administered 2023-09-26: 1 mg via INTRAMUSCULAR
  Filled 2023-09-26: qty 1

## 2023-09-26 MED ORDER — DOXYCYCLINE HYCLATE 100 MG PO CAPS: ORAL_CAPSULE | ORAL | 0 refills | Status: AC

## 2023-09-26 MED ORDER — ONDANSETRON 4 MG PO TBDP: ORAL_TABLET | ORAL | 0 refills | Status: AC

## 2023-09-26 MED ORDER — KETOROLAC TROMETHAMINE 30 MG/ML IJ SOLN
60.0000 mg | Freq: Once | INTRAMUSCULAR | Status: DC
  Filled 2023-09-26: qty 2

## 2023-09-26 MED ORDER — CEFTRIAXONE SODIUM 1 G IJ SOLR
1.0000 g | Freq: Once | INTRAMUSCULAR | Status: AC
  Administered 2023-09-26: 1 g via INTRAMUSCULAR
  Filled 2023-09-26: qty 10

## 2023-09-26 MED ORDER — OXYCODONE-ACETAMINOPHEN 5-325 MG PO TABS: ORAL_TABLET | ORAL | 0 refills | Status: AC

## 2023-09-26 MED ORDER — HYDROCODONE-ACETAMINOPHEN 5-325 MG PO TABS
1.0000 | ORAL_TABLET | Freq: Once | ORAL | Status: AC
  Administered 2023-09-26: 1 via ORAL
  Filled 2023-09-26: qty 1

## 2023-09-26 MED ORDER — ONDANSETRON HCL 4 MG/2ML IJ SOLN
4.0000 mg | Freq: Once | INTRAMUSCULAR | Status: DC
  Filled 2023-09-26: qty 2

## 2023-09-26 MED ORDER — ONDANSETRON 4 MG PO TBDP
4.0000 mg | ORAL_TABLET | Freq: Once | ORAL | Status: AC
  Administered 2023-09-26: 4 mg via ORAL
  Filled 2023-09-26: qty 1

## 2023-09-26 MED ORDER — AMOXICILLIN-POT CLAVULANATE 875-125 MG PO TABS: 1.0000 | ORAL_TABLET | Freq: Two times a day (BID) | ORAL | 0 refills | Status: AC

## 2023-09-26 MED ORDER — HYDROMORPHONE HCL 1 MG/ML IJ SOLN
0.5000 mg | Freq: Once | INTRAMUSCULAR | Status: DC
  Filled 2023-09-26: qty 0.5

## 2023-09-26 NOTE — ED Notes (Signed)
 ED Provider at bedside.

## 2023-09-26 NOTE — ED Notes (Signed)
 Patient transported to Ultrasound

## 2023-09-26 NOTE — ED Triage Notes (Signed)
 Pt arrived via POV c/o LLQ abdominal pain that began apprx 2-3 weeks ago. Pt tearful in Triage and reports last BM yesterday. Pt denies N/V/D.

## 2023-09-26 NOTE — ED Provider Notes (Signed)
 [facility] AT [facility] Provider Note   CSN: [id number] Arrival date & time: 09/26/23  1813     Patient presents with: Abdominal Pain   Meagan Oneill is a 33 y.o. female.  {Add pertinent medical, surgical, social history, OB history to IEP:32951} Patient with abdominal pain for 3 weeks.  Patient hurts in left lower quadrant.  Patient has a history of obesity.  She had a CT scan of the abdomen that was unremarkable recently   Abdominal Pain      Prior to Admission medications   Medication Sig Start Date End Date Taking? Authorizing Provider  amoxicillin -clavulanate (AUGMENTIN) 875-125 MG tablet Take 1 tablet by mouth 2 (two) times daily. 09/26/23  Yes Rayhan Groleau, MD  doxycycline  (VIBRAMYCIN ) 100 MG capsule One po bid 09/26/23  Yes Juanmanuel Marohl, MD  ondansetron  (ZOFRAN -ODT) 4 MG disintegrating tablet 4mg  ODT q4 hours prn nausea/vomit 09/26/23  Yes Maritza Hosterman, MD  oxyCODONE -acetaminophen  (PERCOCET/ROXICET) 5-325 MG tablet Take 1 every 6 hours for pain if not relieved by Tylenol  1 09/26/23  Yes Rudine Rieger, MD  Chlorphen-PE-Acetaminophen  (NOREL AD) 4-10-325 MG TABS Take 1 tablet every 4 hours while symptoms persists. Do not take more than 6 tablets in 24 hours. 05/11/23   Zarwolo, Gloria, FNP  fluticasone  (FLONASE ) 50 MCG/ACT nasal spray Place 2 sprays into both nostrils daily. 05/11/23   Zarwolo, Gloria, FNP  guaiFENesin  (ROBITUSSIN) 100 MG/5ML liquid Take 5 mLs by mouth every 4 (four) hours as needed for cough or to loosen phlegm. 05/11/23   Zarwolo, Gloria, FNP  hydrOXYzine  (VISTARIL ) 25 MG capsule Take 1 capsule (25 mg total) by mouth every 8 (eight) hours as needed. 03/27/23   Zarwolo, Gloria, FNP  Vitamin D , Ergocalciferol , (DRISDOL ) 1.25 MG (50000 UNIT) CAPS capsule Take 1 capsule (50,000 Units total) by mouth every 7 (seven) days. 05/01/23   Zarwolo, Gloria, FNP  ferrous sulfate  325 (65 FE) MG tablet Take 1 tablet (325 mg total) by mouth daily  for 14 days. 10/21/18 09/11/19  Couture, Cortni S, PA-C    Allergies: Morphine  and codeine    Review of Systems  Gastrointestinal:  Positive for abdominal pain.    Updated Vital Signs BP (!) 131/92   Pulse 85   Temp 98.5 F (36.9 C) (Oral)   Resp 19   Ht 5' 3 (1.6 m)   Wt (!) 173.3 kg   SpO2 97%   BMI 67.68 kg/m   Physical Exam  (all labs ordered are listed, but only abnormal results are displayed) Labs Reviewed  COMPREHENSIVE METABOLIC PANEL WITH GFR - Abnormal; Notable for the following components:      Result Value   Glucose, Bld 116 (*)    Total Protein 8.7 (*)    All other components within normal limits  URINALYSIS, ROUTINE W REFLEX MICROSCOPIC - Abnormal; Notable for the following components:   Ketones, ur 5 (*)    Protein, ur 30 (*)    Bacteria, UA RARE (*)    All other components within normal limits  CBC WITH DIFFERENTIAL/PLATELET - Abnormal; Notable for the following components:   WBC 11.3 (*)    RBC 6.15 (*)    MCV 68.0 (*)    MCH 21.5 (*)    RDW 21.8 (*)    Lymphs Abs 4.2 (*)    All other components within normal limits  LIPASE, BLOOD  HCG, QUANTITATIVE, PREGNANCY    EKG: None  Radiology: US  Pelvis Complete Result Date: 09/26/2023 CLINICAL DATA:  Left lower quadrant pain for 3 weeks, endometrial biopsy yesterday EXAM: TRANSABDOMINAL AND TRANSVAGINAL ULTRASOUND OF PELVIS DOPPLER ULTRASOUND OF OVARIES TECHNIQUE: Both transabdominal and transvaginal ultrasound examinations of the pelvis were performed. Transabdominal technique was performed for global imaging of the pelvis including uterus, ovaries, adnexal regions, and pelvic cul-de-sac. It was necessary to proceed with endovaginal exam following the transabdominal exam to visualize the endometrium and adnexal structures. Color and duplex Doppler ultrasound was utilized to evaluate blood flow to the ovaries. COMPARISON:  03/24/2022 FINDINGS: Uterus Measurements: 8.9 x 3.9 x 5.6 cm = volume: 102.1 mL. No  fibroids or other mass visualized. Endometrium Thickness: 7 mm.  No focal abnormality visualized. Right ovary Measurements: 2.4 x 1.2 x 1.2 cm = volume: 1.9 mL. Normal appearance/no adnexal mass. Left ovary Measurements: 5.4 x 5.1 x 4.9 cm = volume: 70.0 mL. There is a complex 3.3 x 2.7 x 2.7 cm cyst within the left ovary, with irregular mural thickening, internal echogenic debris, and increased peripheral vascularity. Pulsed Doppler evaluation of both ovaries demonstrates normal low-resistance arterial and venous waveforms. Other findings No abnormal free fluid. IMPRESSION: 1. Complex 3.3 cm left adnexal cyst, with irregular mural thickening, internal echogenicity, and increased peripheral vascularity. The appearance is concerning for tubo-ovarian abscess, and correlation with clinical exam findings and laboratory evaluation is recommended. Less likely, this could reflect a hemorrhagic cyst. 2. No sonographic evidence of ovarian torsion. 3. Otherwise unremarkable exam. Electronically Signed   By: Bobbye Burrow M.D.   On: 09/26/2023 22:00   US  PELVIC DOPPLER (TORSION R/O OR MASS ARTERIAL FLOW) Result Date: 09/26/2023 CLINICAL DATA:  Left lower quadrant pain for 3 weeks, endometrial biopsy yesterday EXAM: TRANSABDOMINAL AND TRANSVAGINAL ULTRASOUND OF PELVIS DOPPLER ULTRASOUND OF OVARIES TECHNIQUE: Both transabdominal and transvaginal ultrasound examinations of the pelvis were performed. Transabdominal technique was performed for global imaging of the pelvis including uterus, ovaries, adnexal regions, and pelvic cul-de-sac. It was necessary to proceed with endovaginal exam following the transabdominal exam to visualize the endometrium and adnexal structures. Color and duplex Doppler ultrasound was utilized to evaluate blood flow to the ovaries. COMPARISON:  03/24/2022 FINDINGS: Uterus Measurements: 8.9 x 3.9 x 5.6 cm = volume: 102.1 mL. No fibroids or other mass visualized. Endometrium Thickness: 7 mm.  No focal  abnormality visualized. Right ovary Measurements: 2.4 x 1.2 x 1.2 cm = volume: 1.9 mL. Normal appearance/no adnexal mass. Left ovary Measurements: 5.4 x 5.1 x 4.9 cm = volume: 70.0 mL. There is a complex 3.3 x 2.7 x 2.7 cm cyst within the left ovary, with irregular mural thickening, internal echogenic debris, and increased peripheral vascularity. Pulsed Doppler evaluation of both ovaries demonstrates normal low-resistance arterial and venous waveforms. Other findings No abnormal free fluid. IMPRESSION: 1. Complex 3.3 cm left adnexal cyst, with irregular mural thickening, internal echogenicity, and increased peripheral vascularity. The appearance is concerning for tubo-ovarian abscess, and correlation with clinical exam findings and laboratory evaluation is recommended. Less likely, this could reflect a hemorrhagic cyst. 2. No sonographic evidence of ovarian torsion. 3. Otherwise unremarkable exam. Electronically Signed   By: Bobbye Burrow M.D.   On: 09/26/2023 22:00   US  PELVIS TRANSVAGINAL NON-OB (TV ONLY) Result Date: 09/26/2023 CLINICAL DATA:  Left lower quadrant pain for 3 weeks, endometrial biopsy yesterday EXAM: TRANSABDOMINAL AND TRANSVAGINAL ULTRASOUND OF PELVIS DOPPLER ULTRASOUND OF OVARIES TECHNIQUE: Both transabdominal and transvaginal ultrasound examinations of the pelvis were performed. Transabdominal technique was performed for global imaging of the pelvis including uterus, ovaries, adnexal regions, and  pelvic cul-de-sac. It was necessary to proceed with endovaginal exam following the transabdominal exam to visualize the endometrium and adnexal structures. Color and duplex Doppler ultrasound was utilized to evaluate blood flow to the ovaries. COMPARISON:  03/24/2022 FINDINGS: Uterus Measurements: 8.9 x 3.9 x 5.6 cm = volume: 102.1 mL. No fibroids or other mass visualized. Endometrium Thickness: 7 mm.  No focal abnormality visualized. Right ovary Measurements: 2.4 x 1.2 x 1.2 cm = volume: 1.9 mL.  Normal appearance/no adnexal mass. Left ovary Measurements: 5.4 x 5.1 x 4.9 cm = volume: 70.0 mL. There is a complex 3.3 x 2.7 x 2.7 cm cyst within the left ovary, with irregular mural thickening, internal echogenic debris, and increased peripheral vascularity. Pulsed Doppler evaluation of both ovaries demonstrates normal low-resistance arterial and venous waveforms. Other findings No abnormal free fluid. IMPRESSION: 1. Complex 3.3 cm left adnexal cyst, with irregular mural thickening, internal echogenicity, and increased peripheral vascularity. The appearance is concerning for tubo-ovarian abscess, and correlation with clinical exam findings and laboratory evaluation is recommended. Less likely, this could reflect a hemorrhagic cyst. 2. No sonographic evidence of ovarian torsion. 3. Otherwise unremarkable exam. Electronically Signed   By: Bobbye Burrow M.D.   On: 09/26/2023 22:00    {Document cardiac monitor, telemetry assessment procedure when appropriate:32947} Procedures   Medications Ordered in the ED  ketorolac  (TORADOL ) 30 MG/ML injection 60 mg (0 mg Intramuscular Hold 09/26/23 1956)  cefTRIAXone  (ROCEPHIN ) injection 1 g (has no administration in time range)  HYDROmorphone  (DILAUDID ) injection 1 mg (1 mg Intramuscular Given 09/26/23 1953)  ondansetron  (ZOFRAN -ODT) disintegrating tablet 4 mg (4 mg Oral Given 09/26/23 1953)  HYDROmorphone  (DILAUDID ) injection 1 mg (1 mg Intramuscular Given 09/26/23 2153)      {Click here for ABCD2, HEART and other calculators REFRESH Note before signing:1}                              Medical Decision Making Amount and/or Complexity of Data Reviewed Labs: ordered. Radiology: ordered.  Risk Prescription drug management.   Possible tubo-ovarian abscess.  Patient is started on Augmentin and doxycycline  and given a shot of Rocephin  and will follow-up with GYN  {Document critical care time when appropriate  Document review of labs and clinical decision  tools ie CHADS2VASC2, etc  Document your independent review of radiology images and any outside records  Document your discussion with family members, caretakers and with consultants  Document social determinants of health affecting pt's care  Document your decision making why or why not admission, treatments were needed:32947:::1}   Final diagnoses:  Tubo-ovarian abscess    ED Discharge Orders          Ordered    amoxicillin -clavulanate (AUGMENTIN) 875-125 MG tablet  2 times daily        09/26/23 2245    doxycycline  (VIBRAMYCIN ) 100 MG capsule        09/26/23 2245    ondansetron  (ZOFRAN -ODT) 4 MG disintegrating tablet        09/26/23 2245    oxyCODONE -acetaminophen  (PERCOCET/ROXICET) 5-325 MG tablet        09/26/23 2245

## 2023-09-26 NOTE — Discharge Instructions (Signed)
 Follow-up with Dr.Eure or one of his colleagues in the next week.

## 2023-10-22 ENCOUNTER — Ambulatory Visit: Payer: Self-pay

## 2023-10-22 NOTE — Telephone Encounter (Signed)
 FYI Only or Action Required?: Action required by provider: advised GYN per ED recommendation, requesting feedback/appt from PCP.  Patient was last seen in primary care on 05/11/2023 by Zarwolo, Gloria, FNP.  Called Nurse Triage reporting Abdominal Pain, medication question, and recent ovarian infection.  Symptoms began several weeks ago.  Interventions attempted: OTC medications: tylenol , Prescription medications: augmentin  and doxycycline  that did not finish, leftover oxycodone , and Other: ED on 6/18 gave rocephin  injection.  Symptoms are: gradually worsening.  Triage Disposition: Call Specialist Today (overriding See Physician Within 24 Hours)  Patient/caregiver understands and will follow disposition?: Yes       Copied from CRM [id number]. Topic: Clinical - Red Word Triage >> Oct 22, 2023 11:15 AM Delon HERO wrote: Red Word that prompted transfer to Nurse Triage: Patient is calling report abdominal pain for 3 weeks. No blood or loose stool. Patient also wanting to schedule an appointment to discuss [facility] taking over her ADHD meidcation script. Reason for Disposition  [1] MODERATE pain (e.g., interferes with normal activities) AND [2] pain comes and goes (cramps) AND [3] present > 24 hours  (Exception: Pain with Vomiting or Diarrhea - see that Guideline.)  Answer Assessment - Initial Assessment Questions 1. LOCATION: Where does it hurt?      Below belly button, went to hospital and put on antibx for said ovaries were swollen and infection in it, didn't finish antibx couldn't swallow pills, 20 pills in there, took 5 pills Left side 2. RADIATION: Does the pain shoot anywhere else? (e.g., chest, back)     no 3. ONSET: When did the pain begin? (e.g., minutes, hours or days ago)      3 weeks 5. PATTERN Does the pain come and go, or is it constant?     Comes and goes 6. SEVERITY: How bad is the pain?  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 RECURRENT SYMPTOM: Have you ever had this type of stomach pain before? If Yes, ask: When was the last time? and What happened that time?      Had and was told had cyst on ovaries, felt similar for this, thought this was another cyst, got bad, went to ED, had U/S told me left ovary was swollen 8. CAUSE: What do you think is causing the stomach pain? (e.g., gallstones, recent abdominal surgery)     Think still the infection since didn't finish antibx 9. RELIEVING/AGGRAVATING FACTORS: What makes it better or worse? (e.g., antacids, bending or twisting motion, bowel movement)     Worse with twisting/bending, gave amoxicillin  in pill form, feel like need amoxicillin  10. OTHER SYMPTOMS: Do you have any other symptoms? (e.g., back pain, diarrhea, fever, urination pain, vomiting)       Denies SOB, chest pain, back pain, diarrhea, fever, urination pain, vomiting 11. PREGNANCY: Is there any chance you are pregnant? When was your last menstrual period?       No none at all  Have a month's worth of ADHD med  Follow up with gyn, didn't follow up    Advised pt be examined with gynecology per ED recommendation, sending message to PCP to see if would prefer to see her then send for ultrasound and see about PCP  taking over ADHD script. Advised ED if any worsening pain or new symptoms like fever or SOB.  Protocols used: Abdominal Pain - Female-A-AH

## 2023-10-28 NOTE — Telephone Encounter (Signed)
 Encouraged to follow up with gyn

## 2023-10-29 ENCOUNTER — Telehealth: Payer: Self-pay | Admitting: Family Medicine

## 2023-10-29 NOTE — Telephone Encounter (Signed)
 FYI Only or Action Required?: Action required by provider: medication refill request. ADHD medication Patient was last seen in primary care on 05/11/2023 by Zarwolo, Gloria, FNP.  Called Nurse Triage reporting No chief complaint on file..  Symptoms began today.  Interventions attempted: Nothing.  Symptoms are: stable.  Triage Disposition: No disposition on file.  Patient/caregiver understands and will follow disposition?:

## 2023-10-29 NOTE — Telephone Encounter (Signed)
 Left detailed vm

## 2023-10-29 NOTE — Telephone Encounter (Unsigned)
 Copied from CRM [id number]. Topic: Clinical - Medication Question >> Oct 29, 2023 10:35 AM Donee H wrote: Reason for CRM: Patient called to check status on ADHD medication. She states she is currently on her last month supply and afraid she will run out. She mentioned medication was prescribed by previous PCP and would like to know if  Zarwolo, Gloria, FNP would send in a refill for it. She states previously spoke to her regarding this but haven't gotten a follow up on request. Please follow up with patient at [phone]

## 2023-11-05 ENCOUNTER — Encounter: Payer: Self-pay | Admitting: Family Medicine

## 2023-11-05 NOTE — Telephone Encounter (Signed)
 Scheduled

## 2023-11-15 ENCOUNTER — Encounter: Payer: Self-pay | Admitting: Family Medicine

## 2023-11-15 ENCOUNTER — Ambulatory Visit (INDEPENDENT_AMBULATORY_CARE_PROVIDER_SITE_OTHER): Payer: Self-pay | Admitting: Family Medicine

## 2023-11-15 VITALS — BP 126/77 | HR 84 | Ht 64.0 in | Wt 302.0 lb

## 2023-11-15 DIAGNOSIS — R41840 Attention and concentration deficit: Secondary | ICD-10-CM

## 2023-11-15 MED ORDER — ATOMOXETINE HCL 25 MG PO CAPS: 25.0000 mg | ORAL_CAPSULE | Freq: Every day | ORAL | 1 refills | Status: DC

## 2023-11-15 NOTE — Assessment & Plan Note (Addendum)
 The patient reports that she was started on Atomoxetine  25 mg daily by her previous provider for symptoms consistent with ADHD. Upon review of the patient's medical records, there is no documentation of a definitive ADHD diagnosis. Educational resources and referrals were provided to assist with obtaining a formal evaluation for ADHD. A refill for Atomoxetine  25 mg daily was provided to maintain continuity of care while awaiting further assessment.

## 2023-11-15 NOTE — Patient Instructions (Addendum)
 I appreciate the opportunity to provide care to you today!    Refills have been sent to the pharmacy.  Here are some options for obtaining neuropsychiatric testing to help determine whether you have ADD/ADHD:  Stephane Prescott, NP [facility] ?? [phone]  [facility] ?? [phone] (Part of [place] - offers multiple providers)  [facility] ?? [phone]  Delon Dimitri, PhD - specializes in Adult ADHD  Jackson Fredrickson, PhD - treats adults with ADHD and bipolar disorder   Please follow up if your symptoms worsen or fail to improve.    Please continue to a heart-healthy diet and increase your physical activities. Try to exercise for at least five days a week.    It was a pleasure to see you and I look forward to continuing to work together on your health and well-being. Please do not hesitate to call the office if you need care or have questions about your care.  In case of emergency, please visit the Emergency Department for urgent care, or contact our clinic at [phone] to schedule an appointment. We're here to help you!   Have a wonderful day and week. With Gratitude, Isidore Margraf MSN, FNP-BC

## 2023-11-15 NOTE — Progress Notes (Signed)
 Established Patient Office Visit  Subjective:  Patient ID: Meagan Oneill, female    DOB: February 11, 1991  Age: 33 y.o. MRN: [id number]  CC:  Chief Complaint  Patient presents with   Medical Management of Chronic Issues    Discuss ADHD Atomoxetine  25 mg medication    HPI Meagan Oneill is a 33 y.o. female with past medical history of generalized anxiety disorder presents for f/u of  chronic medical conditions.  For the details of today's visit, please refer to the assessment and plan.    Past Medical History:  Diagnosis Date   Anemia    Asthma    no inhaler   Environmental allergies    Hypertension     Past Surgical History:  Procedure Laterality Date   I & D EXTREMITY Left 05/13/2021   Procedure: DEBRIDEMENT LEFT KNEE;  Surgeon: Harden Jerona GAILS, MD;  Location: [facility];  Service: Orthopedics;  Laterality: Left;   KNEE ARTHROSCOPY WITH ANTERIOR CRUCIATE LIGAMENT (ACL) REPAIR WITH HAMSTRING GRAFT Left 03/25/2021   Procedure: left knee anterior cruciate ligament, posterior cruciate ligment, posterolateral corner reconstruction arthroscopy; allograft ligaments for reconstruction, meniscal debridement vs repair;  Surgeon: Addie Cordella Hamilton, MD;  Location: [facility] OR;  Service: Orthopedics;  Laterality: Left;   WISDOM TOOTH EXTRACTION      Family History  Problem Relation Age of Onset   Hypertension Mother    Diabetes Mother    Hypertension Sister     Social History   Socioeconomic History   Marital status: Single    Spouse name: Not on file   Number of children: Not on file   Years of education: Not on file   Highest education level: Not on file  Occupational History   Not on file  Tobacco Use   Smoking status: Never    Passive exposure: Never   Smokeless tobacco: Never  Vaping Use   Vaping status: Never Used  Substance and Sexual Activity   Alcohol use: Not Currently    Alcohol/week: 1.0 standard drink of alcohol    Types: 1 Glasses of wine per week    Comment: occas.   Drug  use: No   Sexual activity: Yes    Birth control/protection: None  Other Topics Concern   Not on file  Social History Narrative   Not on file   Social Drivers of Health   Financial Resource Strain: Low Risk  (06/18/2023)   Received from [facility]   Overall Financial Resource Strain (CARDIA)    Difficulty of Paying Living Expenses: Not hard at all  Food Insecurity: No Food Insecurity (09/25/2023)   Received from [facility]   Hunger Vital Sign    Within the past 12 months, you worried that your food would run out before you got the money to buy more.: Never true    Within the past 12 months, the food you bought just didn't last and you didn't have money to get more.: Never true  Transportation Needs: No Transportation Needs (09/25/2023)   Received from [facility]   PRAPARE - Transportation    Lack of Transportation (Medical): No    Lack of Transportation (Non-Medical): No  Physical Activity: Not on file  Stress: Not on file  Social Connections: Unknown (08/09/2021)   Received from [facility]   Social Network    Social Network: Not on file  Intimate Partner Violence: Unknown (07/15/2021)   Received from [facility]   HITS    Physically Hurt: Not  on file    Insult or Talk Down To: Not on file    Threaten Physical Harm: Not on file    Scream or Curse: Not on file    Outpatient Medications Prior to Visit  Medication Sig Dispense Refill   amoxicillin -clavulanate (AUGMENTIN ) 875-125 MG tablet Take 1 tablet by mouth 2 (two) times daily. 20 tablet 0   Chlorphen-PE-Acetaminophen  (NOREL AD) 4-10-325 MG TABS Take 1 tablet every 4 hours while symptoms persists. Do not take more than 6 tablets in 24 hours. 20 tablet 0   doxycycline  (VIBRAMYCIN ) 100 MG capsule One po bid 20 capsule 0   fluticasone  (FLONASE ) 50 MCG/ACT nasal spray Place 2 sprays into both nostrils daily. 16 g 1   guaiFENesin  (ROBITUSSIN) 100 MG/5ML liquid Take 5 mLs by mouth every 4 (four) hours as needed for  cough or to loosen phlegm. 120 mL 0   hydrOXYzine  (VISTARIL ) 25 MG capsule Take 1 capsule (25 mg total) by mouth every 8 (eight) hours as needed. 30 capsule 1   ondansetron  (ZOFRAN -ODT) 4 MG disintegrating tablet 4mg  ODT q4 hours prn nausea/vomit 10 tablet 0   oxyCODONE -acetaminophen  (PERCOCET/ROXICET) 5-325 MG tablet Take 1 every 6 hours for pain if not relieved by Tylenol  1 15 tablet 0   Vitamin D , Ergocalciferol , (DRISDOL ) 1.25 MG (50000 UNIT) CAPS capsule Take 1 capsule (50,000 Units total) by mouth every 7 (seven) days. 20 capsule 1   No facility-administered medications prior to visit.    Allergies  Allergen Reactions   Morphine  And Codeine Hives    04/07/2021- Patient is currently tolerating tramadol  and oxycodone  without reaction.     ROS Review of Systems  Constitutional:  Negative for chills and fever.  Eyes:  Negative for visual disturbance.  Respiratory:  Negative for chest tightness and shortness of breath.   Neurological:  Negative for dizziness and headaches.      Objective:    Physical Exam HENT:     Head: Normocephalic.     Mouth/Throat:     Mouth: Mucous membranes are moist.  Cardiovascular:     Rate and Rhythm: Normal rate.     Heart sounds: Normal heart sounds.  Pulmonary:     Effort: Pulmonary effort is normal.     Breath sounds: Normal breath sounds.  Neurological:     Mental Status: She is alert.     BP 126/77   Pulse 84   Ht 5' 4 (1.626 m)   Wt (!) 302 lb (137 kg)   SpO2 97%   BMI 51.84 kg/m  Wt Readings from Last 3 Encounters:  11/15/23 (!) 302 lb (137 kg)  09/26/23 (!) 382 lb 0.9 oz (173.3 kg)  05/11/23 (!) 382 lb (173.3 kg)    Lab Results  Component Value Date   TSH 2.310 04/30/2023   Lab Results  Component Value Date   WBC 11.3 (H) 09/26/2023   HGB 13.2 09/26/2023   HCT 41.8 09/26/2023   MCV 68.0 (L) 09/26/2023   PLT 239 09/26/2023   Lab Results  Component Value Date   NA 138 09/26/2023   K 3.9 09/26/2023   CO2 23  09/26/2023   GLUCOSE 116 (H) 09/26/2023   BUN 8 09/26/2023   CREATININE 0.75 09/26/2023   BILITOT 0.8 09/26/2023   ALKPHOS 52 09/26/2023   AST 29 09/26/2023   ALT 19 09/26/2023   PROT 8.7 (H) 09/26/2023   ALBUMIN 4.1 09/26/2023   CALCIUM 9.6 09/26/2023   ANIONGAP 11 09/26/2023   EGFR  104 04/30/2023   Lab Results  Component Value Date   CHOL 140 04/30/2023   Lab Results  Component Value Date   HDL 45 04/30/2023   Lab Results  Component Value Date   LDLCALC 76 04/30/2023   Lab Results  Component Value Date   TRIG 101 04/30/2023   Lab Results  Component Value Date   CHOLHDL 3.1 04/30/2023   Lab Results  Component Value Date   HGBA1C 5.3 04/30/2023      Assessment & Plan:  Poor concentration Assessment & Plan: The patient reports that she was started on Atomoxetine  25 mg daily by her previous provider for symptoms consistent with ADHD. Upon review of the patient's medical records, there is no documentation of a definitive ADHD diagnosis. Educational resources and referrals were provided to assist with obtaining a formal evaluation for ADHD. A refill for Atomoxetine  25 mg daily was provided to maintain continuity of care while awaiting further assessment.   Orders: -     Atomoxetine  HCl; Take 1 capsule (25 mg total) by mouth daily.  Dispense: 60 capsule; Refill: 1  Note: This chart has been completed using Engineer, civil (consulting) software, and while attempts have been made to ensure accuracy, certain words and phrases may not be transcribed as intended.    Follow-up: No follow-ups on file.   Cecille Mcclusky, FNP

## 2023-12-20 DIAGNOSIS — Z48815 Encounter for surgical aftercare following surgery on the digestive system: Secondary | ICD-10-CM | POA: Diagnosis not present

## 2023-12-20 DIAGNOSIS — Z903 Acquired absence of stomach [part of]: Secondary | ICD-10-CM | POA: Diagnosis not present

## 2023-12-20 DIAGNOSIS — L659 Nonscarring hair loss, unspecified: Secondary | ICD-10-CM | POA: Diagnosis not present

## 2023-12-25 DIAGNOSIS — Z23 Encounter for immunization: Secondary | ICD-10-CM | POA: Diagnosis not present

## 2023-12-31 DIAGNOSIS — N760 Acute vaginitis: Secondary | ICD-10-CM | POA: Diagnosis not present

## 2023-12-31 DIAGNOSIS — Z114 Encounter for screening for human immunodeficiency virus [HIV]: Secondary | ICD-10-CM | POA: Diagnosis not present

## 2023-12-31 DIAGNOSIS — B9689 Other specified bacterial agents as the cause of diseases classified elsewhere: Secondary | ICD-10-CM | POA: Diagnosis not present

## 2023-12-31 DIAGNOSIS — Z113 Encounter for screening for infections with a predominantly sexual mode of transmission: Secondary | ICD-10-CM | POA: Diagnosis not present

## 2024-02-07 ENCOUNTER — Ambulatory Visit: Payer: Self-pay | Admitting: Family Medicine

## 2024-04-13 ENCOUNTER — Other Ambulatory Visit: Payer: Self-pay | Admitting: Family Medicine

## 2024-04-13 DIAGNOSIS — R41840 Attention and concentration deficit: Secondary | ICD-10-CM
